# Patient Record
Sex: Male | Born: 1950 | Race: White | Hispanic: No | Marital: Married | Smoking: Former smoker
Health system: Southern US, Community
[De-identification: ages and names within clinical notes are randomized; demographics above are authoritative.]

## PROBLEM LIST (undated history)

## (undated) DIAGNOSIS — J449 Chronic obstructive pulmonary disease, unspecified: Secondary | ICD-10-CM

## (undated) DIAGNOSIS — D869 Sarcoidosis, unspecified: Secondary | ICD-10-CM

## (undated) DIAGNOSIS — F329 Major depressive disorder, single episode, unspecified: Secondary | ICD-10-CM

## (undated) DIAGNOSIS — C7931 Secondary malignant neoplasm of brain: Secondary | ICD-10-CM

## (undated) DIAGNOSIS — F32A Depression, unspecified: Secondary | ICD-10-CM

## (undated) DIAGNOSIS — K219 Gastro-esophageal reflux disease without esophagitis: Secondary | ICD-10-CM

## (undated) DIAGNOSIS — E871 Hypo-osmolality and hyponatremia: Secondary | ICD-10-CM

## (undated) DIAGNOSIS — IMO0001 Reserved for inherently not codable concepts without codable children: Secondary | ICD-10-CM

## (undated) DIAGNOSIS — IMO0002 Reserved for concepts with insufficient information to code with codable children: Secondary | ICD-10-CM

## (undated) DIAGNOSIS — M009 Pyogenic arthritis, unspecified: Secondary | ICD-10-CM

## (undated) DIAGNOSIS — Z66 Do not resuscitate: Secondary | ICD-10-CM

## (undated) DIAGNOSIS — I10 Essential (primary) hypertension: Secondary | ICD-10-CM

## (undated) DIAGNOSIS — C3490 Malignant neoplasm of unspecified part of unspecified bronchus or lung: Secondary | ICD-10-CM

## (undated) HISTORY — DX: Do not resuscitate: Z66

## (undated) HISTORY — DX: Reserved for concepts with insufficient information to code with codable children: IMO0002

## (undated) HISTORY — DX: Reserved for inherently not codable concepts without codable children: IMO0001

## (undated) HISTORY — PX: TONSILLECTOMY: SUR1361

---

## 1999-03-04 ENCOUNTER — Encounter: Payer: Self-pay | Admitting: Family Medicine

## 1999-03-04 ENCOUNTER — Ambulatory Visit (HOSPITAL_COMMUNITY): Admission: RE | Admit: 1999-03-04 | Discharge: 1999-03-04 | Payer: Self-pay | Admitting: Family Medicine

## 2004-11-21 ENCOUNTER — Encounter (INDEPENDENT_AMBULATORY_CARE_PROVIDER_SITE_OTHER): Payer: Self-pay | Admitting: *Deleted

## 2004-11-21 ENCOUNTER — Ambulatory Visit (HOSPITAL_COMMUNITY): Admission: RE | Admit: 2004-11-21 | Discharge: 2004-11-21 | Payer: Self-pay | Admitting: *Deleted

## 2004-12-27 ENCOUNTER — Ambulatory Visit: Admission: RE | Admit: 2004-12-27 | Discharge: 2004-12-27 | Payer: Self-pay | Admitting: Unknown Physician Specialty

## 2005-04-13 ENCOUNTER — Ambulatory Visit: Admission: RE | Admit: 2005-04-13 | Discharge: 2005-04-13 | Payer: Self-pay | Admitting: Family Medicine

## 2006-05-30 HISTORY — PX: CHOLECYSTECTOMY: SHX55

## 2006-06-01 ENCOUNTER — Encounter (INDEPENDENT_AMBULATORY_CARE_PROVIDER_SITE_OTHER): Payer: Self-pay | Admitting: Specialist

## 2006-06-01 ENCOUNTER — Ambulatory Visit (HOSPITAL_COMMUNITY): Admission: RE | Admit: 2006-06-01 | Discharge: 2006-06-01 | Payer: Self-pay | Admitting: Surgery

## 2007-05-23 ENCOUNTER — Encounter
Admission: RE | Admit: 2007-05-23 | Discharge: 2007-05-23 | Payer: Self-pay | Admitting: Physical Medicine and Rehabilitation

## 2007-08-19 ENCOUNTER — Encounter: Admission: RE | Admit: 2007-08-19 | Discharge: 2007-08-19 | Payer: Self-pay | Admitting: Neurological Surgery

## 2008-01-29 HISTORY — PX: CARPAL TUNNEL RELEASE: SHX101

## 2008-01-29 HISTORY — PX: ULNAR NERVE REPAIR: SHX2594

## 2008-02-14 ENCOUNTER — Ambulatory Visit (HOSPITAL_COMMUNITY): Admission: RE | Admit: 2008-02-14 | Discharge: 2008-02-14 | Payer: Self-pay | Admitting: Neurological Surgery

## 2008-07-13 ENCOUNTER — Encounter: Admission: RE | Admit: 2008-07-13 | Discharge: 2008-07-13 | Payer: Self-pay | Admitting: Gastroenterology

## 2008-12-28 ENCOUNTER — Ambulatory Visit (HOSPITAL_BASED_OUTPATIENT_CLINIC_OR_DEPARTMENT_OTHER): Admission: RE | Admit: 2008-12-28 | Discharge: 2008-12-28 | Payer: Self-pay | Admitting: Specialist

## 2008-12-28 ENCOUNTER — Encounter (INDEPENDENT_AMBULATORY_CARE_PROVIDER_SITE_OTHER): Payer: Self-pay | Admitting: Specialist

## 2008-12-28 HISTORY — PX: EYE SURGERY: SHX253

## 2009-10-30 DIAGNOSIS — M009 Pyogenic arthritis, unspecified: Secondary | ICD-10-CM

## 2009-10-30 HISTORY — DX: Pyogenic arthritis, unspecified: M00.9

## 2011-02-09 LAB — POCT HEMOGLOBIN-HEMACUE: Hemoglobin: 15.1 g/dL (ref 13.0–17.0)

## 2011-02-14 LAB — BASIC METABOLIC PANEL
CO2: 26 mEq/L (ref 19–32)
Calcium: 9.1 mg/dL (ref 8.4–10.5)
Creatinine, Ser: 0.73 mg/dL (ref 0.4–1.5)
GFR calc Af Amer: >60 mL/min (ref >60)
GFR calc non Af Amer: >60 mL/min (ref >60)
Sodium: 132 mEq/L — ABNORMAL LOW (ref 135–145)

## 2011-03-14 NOTE — Op Note (Signed)
NAME:  Spencer Long, Spencer Long              ACCOUNT NO.:  [id number]   MEDICAL RECORD NO.:  [id number]          PATIENT TYPE:  AMB   LOCATION:  DSC                          FACILITY:  [facility]   PHYSICIAN:  Earvin Hansen L. Truesdale, M.D.DATE OF BIRTH:  1951/10/23   DATE OF PROCEDURE:  12/28/2008  DATE OF DISCHARGE:                               OPERATIVE REPORT   A 60 year old gentleman has severe ectropion above his left lower lid  area times last year during the half.  The patient denies any trauma.  He denies any history of any neurological problems.  He was worked up  neurologically preoperatively.  No history of myasthenia gravis, stroke,  or neurological problems.  No other endocrinopathies by history.   PROCEDURES PERFORMED:  Correction of ectropion, release of band, wedge  excision and shortening as well as full-thickness skin graft  reconstruction.   ANESTHESIA:  General.   The patient underwent general anesthesia and intubated orally.  Preoperatively, the patient was set up and drawn for the infraciliary  margin as well as excess skin.  He had a laxity of 4 mm from the 6  o'clock position of the iris to the lid, especially the midportion.  Prep was done to the face with Betadine soap and solution and walled off  with sterile towels and drapes so as to make a sterile field.  The eyes  were protected with Lacri-Lube and a shield on the left side.  We  injected the area with 1% Xylocaine with epinephrine to the skin  locally.  We did a subciliary excision and flap with releasing of  tremendous bands in the midportion of the muscle as well as just under  the ciliary margin.  We dissected all way the down to the infraorbital  rim area and laterally to free up any bands of any pull on the  ectropion.  After doing this, the skin and the laxity of the ciliary  margins were tremendous outlining the excision of excess tissue and was  able to mount that back together lightly with multiple sutures  of 5-0  silk.  We allowed the tissue to settle down to allow everything to  retract and it showed a deficit of approximately 5 mm in the midportion  of skin.  We did a skin excision of upper lid with a 15 blade full-  thickness, that defect was reclosed back with running subcuticular  stitch of 5-0 nylon.  We also took some of the muscular tissue and  anchored it to the periosteum using a 3-0 deeply buried clear Prolene  suture.  Even with this, laxity continued and the full-thickness skin  graft was then placed into the defect and secured with multiple sutures  of 6-0 silks.  This fit very well and the repair was also protected with  a tarsorrhaphy using a 2-0 silk suture.  The wounds were cleansed.  Irrigation was done prior to this and the shield removed.  Next,  Xeroform eye patches were placed and a soft dressing.  I was very happy  with the results at the end.  He withstood the  procedures very well and  was taken to recovery in good condition.  We will allow his dressings to  stay at least 5 days before changing them to protect the graft there and  leave the tarsorrhaphy for about 5 days.       Spencer Long. Shon Hough, M.D.  Electronically Signed     GLT/MEDQ  D:  12/28/2008  T:  12/29/2008  Job:  [id number]

## 2011-03-14 NOTE — Op Note (Signed)
NAMELOYALTY, BRASHIER              ACCOUNT NO.:  [id number]   MEDICAL RECORD NO.:  [id number]          PATIENT TYPE:  AMB   LOCATION:  SDS                          FACILITY:  [facility]   PHYSICIAN:  Tia Alert, MD     DATE OF BIRTH:  July 23, 1951   DATE OF PROCEDURE:  02/14/2008  DATE OF DISCHARGE:                               OPERATIVE REPORT   PREOPERATIVE DIAGNOSES:  1. Right carpal tunnel syndrome.  2. Right ulnar neuropathy.   POSTOPERATIVE DIAGNOSES:  1. Right carpal tunnel syndrome.  2. Right ulnar neuropathy.   PROCEDURES:  1. Right ulnar nerve decompression.  2. Right carpal tunnel release.   SURGEON:  Tia Alert, MD.   ANESTHESIA:  General endotracheal.   COMPLICATIONS:  None apparent.   INDICATIONS FOR PROCEDURE:  Mr. Wherley is a 60 year old gentleman who  presented with numbness and weakness in his right hand.  He had nerve  conduction studies, which showed a significant ulnar neuropathy on the  right side, but also a severe right median neuropathy.  I recommended a  right ulnar nerve decompression as well as a right carpal tunnel  release.  She understood the risks, benefits, and expected outcome and  wished to proceed.   DESCRIPTION OF PROCEDURE:  The patient was taken to the operating room  and after induction of adequate generalized endotracheal anesthesia, his  right arm was extended on arm board and prepped circumferentially with  DuraPrep and then draped in the usual sterile fashion.  It was prepped  from the axilla to the fingertips.  We started the carpal tunnel, made a  small palmar incision from the distal wrist crease into the palm in  alignment with the middle finger, dissected down through the palmar  fascia, placed a self-retaining retractor, and the palmar fat was  coagulated with bipolar cautery.  The transverse carpal ligament was  identified and then opened with a 15 blade scalpel.  I then spread  between the ligament in the nerve with a  mosquito and transected the  transverse carpal ligament distally into the palm until the palmar fat  was identified, and the transverse carpal ligament was completely  transected distally into the palm.  I then palpated with the mosquito to  assure that the transverse carpal ligament was completely transected,  the nerve looked healthy and free.  I then performed the same procedure,  dissecting proximally into the arm under the wrist crease, dissecting  between the nerve and the transverse carpal ligament, and transecting  the ligament proximally into the arm until it was completely transected.  I then palpated once again with the mosquito to assure complete  transection of the transverse carpal ligament.  I then inspected the  nerve, the nerve was healthy and free, I irrigated with saline solution-  containing bacitracin, dried all bleeding points, and then closed the  fascia with a single 3-0 Vicryl suture, and then closed the subcutaneous  tissues with 3-0 Vicryl sutures, and the skin with 4-0 Ethilon  interrupted vertical mattress sutures.  I then turned my attention to  the right  ulnar neuropathy.  I made a curvilinear incision over the  right medial epicondyle of the ulna, dissected down through the soft  tissues into the cubital tunnel where I was able to spread gently,  identify the underlying ulnar nerve, and then dissect between the nerve  and the soft tissues distally into the arm, till I got to the flexor  musculature, opened the retinaculum, and completely decompressed the  nerve distally into the arm.  I then did spread between the nerve and  the soft tissues proximally until I went between the heads of the  triceps.  I then palpated along the nerve, I did not circumferentially  decompress the nerve in order to preserve its blood supply, and I did  not transpose the nerve.  I then irrigated it with saline solution.  The  nerve looked to be free.  I flexed the elbow to make  sure it did not  sublux, it did not.  Therefore, I dried all bleeding points with bipolar  cautery, closed the subcutaneous tissues with 2-0 Vicryl, closed the  subcuticular tissues with 3-0 Vicryl, and closed the skin with benzoin  and Steri-Strips.  The hand and the elbow were then wrapped in a Kerlix  and a Ace bandage.  The patient was then awakened from general  anesthesia and transferred to the recovery room in stable condition.  At  the end of procedure, all sponge, needle, and instrument counts were  correct.      Tia Alert, MD  Electronically Signed     DSJ/MEDQ  D:  02/14/2008  T:  02/14/2008  Job:  [id number]

## 2011-03-17 NOTE — Op Note (Signed)
NAME:  Spencer Long, Spencer Long              ACCOUNT NO.:  [id number]   MEDICAL RECORD NO.:  [id number]          PATIENT TYPE:  AMB   LOCATION:  DAY                          FACILITY:  [facility]   PHYSICIAN:  Wilmon Arms. Corliss Skains, M.D. DATE OF BIRTH:  13-Nov-1950   DATE OF PROCEDURE:  06/01/2006  DATE OF DISCHARGE:                                 OPERATIVE REPORT   PREOPERATIVE DIAGNOSIS:  Chronic calculus cholecystitis.   POSTOPERATIVE DIAGNOSIS:  Chronic calculus cholecystitis.   PROCEDURE PERFORMED:  Laparoscopic cholecystectomy with intraoperative  cholangiogram.   SURGEON:  Dr. Corliss Skains   ASSISTANT:  Dr. Kendrick Ranch   ANESTHESIA:  General endotracheal.   INDICATIONS:  The patient is a 60 year old male, who presents with several  months of posterior shoulder pain, abdominal bloating and diarrhea.  His  physician has thoroughly evaluated him with stool studies which are  negative.  CT scan showed a nonobstructing right kidney stone and multiple  gallstones.  Liver function tests were normal.  His colonoscopy last year  was negative.  Due to his symptoms and the presence of gallstones, we  recommended a laparoscopic cholecystectomy.  The patient understands that  his symptoms are atypical and that there is no guarantee that  cholecystectomy will resolve his symptoms.   DESCRIPTION OF PROCEDURE:  The patient was brought to the operating room,  placed supine position on the operating room table.  After an adequate level  of general anesthesia was obtained, the patient's abdomen was prepped with  Betadine and draped in sterile fashion.  Time-out was taken assure the  proper patient, proper procedure.  The area around his umbilicus was  infiltrated with 0.25% Marcaine, and a transverse incision was made here.  Dissection was carried down to the fascia which was opened vertically.  The  peritoneum was entered, and a stay suture of 0 Vicryl was placed in a  pursestring fashion around the fascial  opening.  The Hasson cannula was  inserted and secured with the stay suture.  The laparoscope was inserted,  and a quick visual examination of abdomen showed a normal-appearing liver  and stomach.  The omentum appeared densely adherent to the gallbladder.  A  10 mm port was placed in the subxiphoid position, and two 5 mm ports were  placed on the right side.  Cautery scissors were used to dissect the omentum  away from the gallbladder.  Once the gallbladder was exposed, it was grasped  with a clamp and elevated over the edge of the liver.  The peritoneum at the  hilum of the gallbladder was opened.  The cystic duct was identified and was  circumferentially dissected.  It was ligated with a clip distally.  A small  opening was created on the cystic duct, and a Cook cholangiogram catheter  was inserted.  It was secured with a clip, and a cholangiogram was then  obtained.  The cholangiogram showed good flow of contrast proximally and  distally through both sides of the biliary tree.  There is good flow into  the duodenum.  No evidence of filling defects.  The catheter was  removed,  and the cystic duct was ligated with clips and divided.  The cystic artery  was also ligated with clips and divided.  Cautery was then used to dissect  the gallbladder free from the liver.  The gallbladder was placed in an  EndoCatch sac.  The gallbladder fossa was then reinspected, and hemostasis  was obtained with cautery.  The gallbladder was removed through the  umbilical port site.  The right upper quadrant was reinspected, and no  bleeding  was noted.  The pursestring suture was used to close the umbilical fascia.  Monocryl 4-0 was used to close the skin in subcuticular fashion.  Steri-  Strips and clean dressings were applied.  The patient was extubated and  brought to recovery in stable condition.  All sponge, instrument, and needle  counts were correct.      Wilmon Arms. Tsuei, M.D.  Electronically  Signed     MKT/MEDQ  D:  06/01/2006  T:  06/01/2006  Job:  [id number]   cc:   Royetta Crochet, MD  Fax: [phone]

## 2011-07-25 LAB — BASIC METABOLIC PANEL
Calcium: 9.5
GFR calc Af Amer: >60
GFR calc non Af Amer: >60
Potassium: 4.5
Sodium: 131 — ABNORMAL LOW

## 2011-07-25 LAB — PROTIME-INR: Prothrombin Time: 12.1

## 2011-07-25 LAB — APTT: aPTT: 29

## 2011-07-25 LAB — CBC
HCT: 43.2
Hemoglobin: 15.2
WBC: 7.5

## 2011-07-25 LAB — DIFFERENTIAL
Basophils Absolute: 0
Eosinophils Relative: 3
Lymphocytes Relative: 27
Lymphs Abs: 2.1
Monocytes Absolute: 0.9
Neutro Abs: 4.4

## 2011-09-12 ENCOUNTER — Encounter (HOSPITAL_BASED_OUTPATIENT_CLINIC_OR_DEPARTMENT_OTHER): Payer: Self-pay | Admitting: Anesthesiology

## 2011-09-12 ENCOUNTER — Encounter (HOSPITAL_BASED_OUTPATIENT_CLINIC_OR_DEPARTMENT_OTHER)
Admission: RE | Disposition: A | Discharge status: Home / Self Care | Payer: Self-pay | Source: Ambulatory Visit | Attending: Orthopedic Surgery

## 2011-09-12 ENCOUNTER — Other Ambulatory Visit: Payer: Self-pay

## 2011-09-12 ENCOUNTER — Ambulatory Visit (HOSPITAL_BASED_OUTPATIENT_CLINIC_OR_DEPARTMENT_OTHER)
Admission: RE | Admit: 2011-09-12 | Discharge: 2011-09-12 | Disposition: A | Discharge status: Home / Self Care | Payer: Managed Care, Other (non HMO) | Source: Ambulatory Visit | Attending: Orthopedic Surgery | Admitting: Orthopedic Surgery

## 2011-09-12 ENCOUNTER — Encounter (HOSPITAL_BASED_OUTPATIENT_CLINIC_OR_DEPARTMENT_OTHER): Payer: Self-pay | Admitting: Certified Registered"

## 2011-09-12 ENCOUNTER — Encounter (HOSPITAL_BASED_OUTPATIENT_CLINIC_OR_DEPARTMENT_OTHER): Payer: Self-pay | Admitting: *Deleted

## 2011-09-12 ENCOUNTER — Ambulatory Visit (HOSPITAL_BASED_OUTPATIENT_CLINIC_OR_DEPARTMENT_OTHER): Payer: Managed Care, Other (non HMO) | Admitting: Anesthesiology

## 2011-09-12 DIAGNOSIS — M7020 Olecranon bursitis, unspecified elbow: Secondary | ICD-10-CM | POA: Insufficient documentation

## 2011-09-12 DIAGNOSIS — F17200 Nicotine dependence, unspecified, uncomplicated: Secondary | ICD-10-CM | POA: Insufficient documentation

## 2011-09-12 DIAGNOSIS — I10 Essential (primary) hypertension: Secondary | ICD-10-CM | POA: Insufficient documentation

## 2011-09-12 DIAGNOSIS — K219 Gastro-esophageal reflux disease without esophagitis: Secondary | ICD-10-CM | POA: Insufficient documentation

## 2011-09-12 HISTORY — PX: I&D EXTREMITY: SHX5045

## 2011-09-12 HISTORY — DX: Gastro-esophageal reflux disease without esophagitis: K21.9

## 2011-09-12 HISTORY — DX: Pyogenic arthritis, unspecified: M00.9

## 2011-09-12 HISTORY — DX: Essential (primary) hypertension: I10

## 2011-09-12 LAB — POCT I-STAT, CHEM 8
BUN: 19 mg/dL (ref 6–23)
Creatinine, Ser: 1 mg/dL (ref 0.50–1.35)
Hemoglobin: 13.9 g/dL (ref 13.0–17.0)
Potassium: 5.5 mEq/L — ABNORMAL HIGH (ref 3.5–5.1)
Sodium: 125 mEq/L — ABNORMAL LOW (ref 135–145)
TCO2: 25 mmol/L (ref 0–100)

## 2011-09-12 SURGERY — IRRIGATION AND DEBRIDEMENT EXTREMITY
Anesthesia: General | Site: Elbow | Laterality: Right | Wound class: Dirty or Infected

## 2011-09-12 MED ORDER — HYDROMORPHONE HCL PF 1 MG/ML IJ SOLN: 0.2500 mg | INTRAMUSCULAR | Status: DC | PRN

## 2011-09-12 MED ORDER — FENTANYL CITRATE 0.05 MG/ML IJ SOLN
INTRAMUSCULAR | Status: DC | PRN
  Administered 2011-09-12 (×2): 50 ug via INTRAVENOUS

## 2011-09-12 MED ORDER — PROPOFOL 10 MG/ML IV EMUL
INTRAVENOUS | Status: DC | PRN
  Administered 2011-09-12: 170 mg via INTRAVENOUS

## 2011-09-12 MED ORDER — LACTATED RINGERS IV SOLN
INTRAVENOUS | Status: DC
  Administered 2011-09-12 (×2): via INTRAVENOUS

## 2011-09-12 MED ORDER — OXYCODONE-ACETAMINOPHEN 5-325 MG PO TABS: ORAL_TABLET | ORAL | Status: DC

## 2011-09-12 MED ORDER — CEFAZOLIN SODIUM 1-5 GM-% IV SOLN
1.0000 g | Freq: Once | INTRAVENOUS | Status: AC
  Administered 2011-09-12: 1 g via INTRAVENOUS

## 2011-09-12 MED ORDER — EPHEDRINE SULFATE 50 MG/ML IJ SOLN
INTRAMUSCULAR | Status: DC | PRN
  Administered 2011-09-12: 10 mg via INTRAVENOUS

## 2011-09-12 MED ORDER — BUPIVACAINE HCL (PF) 0.25 % IJ SOLN
INTRAMUSCULAR | Status: DC | PRN
  Administered 2011-09-12: 10 mL

## 2011-09-12 MED ORDER — ONDANSETRON HCL 4 MG/2ML IJ SOLN
INTRAMUSCULAR | Status: DC | PRN
  Administered 2011-09-12: 4 mg via INTRAVENOUS

## 2011-09-12 MED ORDER — SULFAMETHOXAZOLE-TRIMETHOPRIM 800-160 MG PO TABS: 1.0000 | ORAL_TABLET | Freq: Two times a day (BID) | ORAL | Status: AC

## 2011-09-12 MED ORDER — LIDOCAINE HCL (CARDIAC) 20 MG/ML IV SOLN
INTRAVENOUS | Status: DC | PRN
  Administered 2011-09-12: 25 mg via INTRAVENOUS

## 2011-09-12 MED ORDER — PROMETHAZINE HCL 25 MG/ML IJ SOLN: 6.2500 mg | INTRAMUSCULAR | Status: DC | PRN

## 2011-09-12 MED ORDER — DEXAMETHASONE SODIUM PHOSPHATE 4 MG/ML IJ SOLN
INTRAMUSCULAR | Status: DC | PRN
  Administered 2011-09-12: 8 mg via INTRAVENOUS

## 2011-09-12 MED ORDER — TRAMADOL HCL 50 MG PO TABS: ORAL_TABLET | ORAL | Status: DC

## 2011-09-12 MED ORDER — KETOROLAC TROMETHAMINE 30 MG/ML IJ SOLN: 15.0000 mg | Freq: Once | INTRAMUSCULAR | Status: DC | PRN

## 2011-09-12 MED ORDER — MIDAZOLAM HCL 5 MG/5ML IJ SOLN
INTRAMUSCULAR | Status: DC | PRN
  Administered 2011-09-12: 1 mg via INTRAVENOUS

## 2011-09-12 MED ORDER — MEPERIDINE HCL 25 MG/ML IJ SOLN: 6.2500 mg | INTRAMUSCULAR | Status: DC | PRN

## 2011-09-12 SURGICAL SUPPLY — 53 items
BAG DECANTER FOR FLEXI CONT (MISCELLANEOUS) IMPLANT
BANDAGE ELASTIC 3 VELCRO ST LF (GAUZE/BANDAGES/DRESSINGS) IMPLANT
BANDAGE GAUZE ELAST BULKY 4 IN (GAUZE/BANDAGES/DRESSINGS) ×2 IMPLANT
BANDAGE GAUZE STRT 1 STR LF (GAUZE/BANDAGES/DRESSINGS) IMPLANT
BLADE MINI RND TIP GREEN BEAV (BLADE) IMPLANT
BLADE SURG 15 STRL LF DISP TIS (BLADE) ×2 IMPLANT
BLADE SURG 15 STRL SS (BLADE) ×2
BNDG COHESIVE 1X5 TAN STRL LF (GAUZE/BANDAGES/DRESSINGS) IMPLANT
BNDG ELASTIC 2 VLCR STRL LF (GAUZE/BANDAGES/DRESSINGS) IMPLANT
BNDG ESMARK 4X9 LF (GAUZE/BANDAGES/DRESSINGS) ×2 IMPLANT
CHLORAPREP W/TINT 26ML (MISCELLANEOUS) ×2 IMPLANT
CLOTH BEACON ORANGE TIMEOUT ST (SAFETY) ×2 IMPLANT
CORDS BIPOLAR (ELECTRODE) IMPLANT
COVER MAYO STAND STRL (DRAPES) ×2 IMPLANT
COVER TABLE BACK 60X90 (DRAPES) ×2 IMPLANT
CUFF TOURNIQUET SINGLE 18IN (TOURNIQUET CUFF) ×2 IMPLANT
DRAPE EXTREMITY T 121X128X90 (DRAPE) ×2 IMPLANT
DRAPE SURG 17X23 STRL (DRAPES) ×2 IMPLANT
DRSG PAD ABDOMINAL 8X10 ST (GAUZE/BANDAGES/DRESSINGS) ×2 IMPLANT
GAUZE PACKING IODOFORM 1/4X5 (PACKING) ×2 IMPLANT
GAUZE XEROFORM 1X8 LF (GAUZE/BANDAGES/DRESSINGS) ×2 IMPLANT
GLOVE BIO SURGEON STRL SZ7.5 (GLOVE) ×2 IMPLANT
GLOVE BIOGEL PI IND STRL 8 (GLOVE) ×1 IMPLANT
GLOVE BIOGEL PI INDICATOR 8 (GLOVE) ×1
GLOVE SKINSENSE NS SZ7.0 (GLOVE) ×1
GLOVE SKINSENSE STRL SZ7.0 (GLOVE) ×1 IMPLANT
GLOVE SURG ORTHO 8.0 STRL STRW (GLOVE) ×2 IMPLANT
GOWN PREVENTION PLUS XLARGE (GOWN DISPOSABLE) ×2 IMPLANT
GOWN STRL REIN XL XLG (GOWN DISPOSABLE) ×4 IMPLANT
LOOP VESSEL MAXI BLUE (MISCELLANEOUS) IMPLANT
NEEDLE 27GAX1X1/2 (NEEDLE) ×2 IMPLANT
NS IRRIG 1000ML POUR BTL (IV SOLUTION) ×2 IMPLANT
PACK BASIN DAY SURGERY FS (CUSTOM PROCEDURE TRAY) ×2 IMPLANT
PAD CAST 3X4 CTTN HI CHSV (CAST SUPPLIES) ×1 IMPLANT
PADDING CAST ABS 4INX4YD NS (CAST SUPPLIES) ×1
PADDING CAST ABS COTTON 4X4 ST (CAST SUPPLIES) ×1 IMPLANT
PADDING CAST COTTON 3X4 STRL (CAST SUPPLIES) ×1
PADDING WEBRIL 4 STERILE (GAUZE/BANDAGES/DRESSINGS) ×2 IMPLANT
PLASTER SPLINT XTRA FAST 3X15 (CAST SUPPLIES) ×10 IMPLANT
SPLINT PLASTER CAST XFAST 3X15 (CAST SUPPLIES) IMPLANT
SPLINT PLASTER XTRA FASTSET 3X (CAST SUPPLIES)
SPONGE GAUZE 4X4 12PLY (GAUZE/BANDAGES/DRESSINGS) ×2 IMPLANT
SPONGE GAUZE 4X4 FOR O.R. (GAUZE/BANDAGES/DRESSINGS) ×2 IMPLANT
STOCKINETTE 4X48 STRL (DRAPES) ×2 IMPLANT
SUT ETHILON 4 0 PS 2 18 (SUTURE) IMPLANT
SWAB CULTURE LIQ STUART DBL (MISCELLANEOUS) IMPLANT
SYR BULB 3OZ (MISCELLANEOUS) ×2 IMPLANT
SYR CONTROL 10ML LL (SYRINGE) ×2 IMPLANT
TOWEL OR 17X24 6PK STRL BLUE (TOWEL DISPOSABLE) ×4 IMPLANT
TUBE ANAEROBIC SPECIMEN COL (MISCELLANEOUS) ×2 IMPLANT
TUBE FEEDING 5FR 15 INCH (TUBING) IMPLANT
UNDERPAD 30X30 INCONTINENT (UNDERPADS AND DIAPERS) ×2 IMPLANT
WATER STERILE IRR 1000ML POUR (IV SOLUTION) IMPLANT

## 2011-09-12 NOTE — Anesthesia Postprocedure Evaluation (Signed)
  Anesthesia Post-op Note  Patient: Spencer Long  Procedure(s) Performed:  IRRIGATION AND DEBRIDEMENT EXTREMITY - I&d RIGHT OLECRANON BURSSA   Patient Location: PACU  Anesthesia Type: General  Level of Consciousness: awake  Airway and Oxygen Therapy: Patient Spontanous Breathing  Post-op Pain: none  Post-op Assessment: Post-op Vital signs reviewed  Post-op Vital Signs: stable  Complications: No apparent anesthesia complications

## 2011-09-12 NOTE — Brief Op Note (Signed)
09/12/2011  1:55 PM  PATIENT:  Spencer Long  60 y.o. male  PRE-OPERATIVE DIAGNOSIS:  INFECTION RIGHT OLECRANON  POST-OPERATIVE DIAGNOSIS:  * No post-op diagnosis entered *  PROCEDURE:  Procedure(s): IRRIGATION AND DEBRIDEMENT EXTREMITY  SURGEON:  Surgeon(s): Chuck Hint, MD  PHYSICIAN ASSISTANT:   ASSISTANTS: none   ANESTHESIA:   general  EBL:  Total I/O In: 1000 [I.V.:1000] Out: -   BLOOD ADMINISTERED:none  DRAINS: none   LOCAL MEDICATIONS USED:  MARCAINE 10CC  SPECIMEN:  Source of Specimen:  right olecranon bursa  DISPOSITION OF SPECIMEN:  micro  COUNTS:  YES  TOURNIQUET:   Total Tourniquet Time Documented: Upper Arm (Right) - 16 minutes  DICTATION: .Other Dictation: Dictation Number [id number]  PLAN OF CARE: Discharge to home after PACU  PATIENT DISPOSITION:  PACU - hemodynamically stable.   Delay start of Pharmacological VTE agent (>24hrs) due to surgical blood loss or risk of bleeding:  no

## 2011-09-12 NOTE — Anesthesia Procedure Notes (Signed)
Procedure Name: LMA Insertion Date/Time: 09/12/2011 1:27 PM Performed by: Radford Pax Pre-anesthesia Checklist: Patient identified, Timeout performed, Emergency Drugs available, Suction available and Patient being monitored Patient Re-evaluated:Patient Re-evaluated prior to inductionOxygen Delivery Method: Circle System Utilized Preoxygenation: Pre-oxygenation with 100% oxygen Intubation Type: IV induction Ventilation: Mask ventilation without difficulty LMA: LMA inserted LMA Size: 4.0 Number of attempts: 1 Placement Confirmation: positive ETCO2 Tube secured with: Tape Dental Injury: Teeth and Oropharynx as per pre-operative assessment

## 2011-09-12 NOTE — Transfer of Care (Signed)
Immediate Anesthesia Transfer of Care Note  Patient: Spencer Long  Procedure(s) Performed:  IRRIGATION AND DEBRIDEMENT EXTREMITY - I&d RIGHT OLECRANON BURSSA   Patient Location: PACU  Anesthesia Type: General  Level of Consciousness: awake, alert , oriented and patient cooperative  Airway & Oxygen Therapy: Patient Spontanous Breathing  Post-op Assessment: Report given to PACU RN and Post -op Vital signs reviewed and stable  Post vital signs: Reviewed and stable  Complications: No apparent anesthesia complications

## 2011-09-12 NOTE — Op Note (Signed)
Dictation [id number]

## 2011-09-12 NOTE — H&P (Signed)
  Spencer Long is an 60 y.o. male.   Chief Complaint: infected right olecranon bursa HPI: 61 year old rhd male states he bumped right elbow ~ 1 week ago.  No wounds or punctures noted.  Started to swell.  Seen at [facility] 11/9.  Needle aspiration.  Continued drainage.  Swelling decreased.  Some AM sweats.  No fevers, chills.  On abx, but cant remember name.  States clear yellow drainage expressed at aspiration.  Past Medical History  Diagnosis Date  . Hypertension     under control, has been on med. x 15-20 yrs.  [name] GERD (gastroesophageal reflux disease)   . Infection of elbow     right    Past Surgical History  Procedure Date  . Cholecystectomy 05/2006  . Ulnar nerve repair 01/2008    right; decompression  . Carpal tunnel release 01/2008    right  . Eye surgery 12/2008    correction ectropion, release band, wedge exc., shortening    History reviewed. No pertinent family history. Social History:  reports that he has been smoking Cigarettes.  He has a 60 pack-year smoking history. He has never used smokeless tobacco. He reports that he drinks alcohol. He reports that he does not use illicit drugs.  Allergies:  Allergies  Allergen Reactions  . Isothiazolinone Chloride Rash  . Quaternium-15 Rash    No current facility-administered medications on file as of .   No current outpatient prescriptions on file as of .    No results found for this or any previous visit (from the past 48 hour(s)).  No results found.   A comprehensive review of systems was negative except for: Gastrointestinal: positive for nausea and vomiting  Height 5\' 11"  (1.803 m), weight 74.39 kg (164 lb).  General appearance: alert, cooperative and appears stated age Head: Normocephalic, without obvious abnormality, atraumatic Neck: supple, symmetrical, trachea midline Resp: clear to auscultation bilaterally Cardio: regular rate and rhythm, S1, S2 normal, no murmur, click, rub or gallop GI: soft,  non-tender; bowel sounds normal; no masses,  no organomegaly Extremities: Bilateral upper extremities intact to light touch sensation and capillary refill in all fingertips.  +epl/fpl/io.  Left upper extremity without wound or ttp.  right upper extremity with small wound posterior elbow with surrounding erythema and swelling.  Fluctuance noted.  No proximal streaking.  Thin fluid drainage. Pulses: 2+ and symmetric Skin: Skin color, texture, turgor normal. No rashes or lesions or posterior right elbow wound with erythema. Neurologic: Grossly normal Incision/Wound: As above.  Assessment/Plan Right olecranon bursitis.  Recommend irrigation and debridement in OR.  Risks, benefits, alternatives discussed and patient agrees with plan of care.  Alissa Pharr R 09/12/2011, 11:16 AM

## 2011-09-12 NOTE — Anesthesia Preprocedure Evaluation (Addendum)
Anesthesia Evaluation  Patient identified by MRN, date of birth, ID band Patient awake    Reviewed: Allergy & Precautions, H&P , NPO status , Patient's Chart, lab work & pertinent test results  History of Anesthesia Complications Negative for: history of anesthetic complications  Airway Mallampati: I  Neck ROM: Full    Dental  (+) Teeth Intact,    Pulmonary neg pulmonary ROS, Current Smoker,  clear to auscultation        Cardiovascular hypertension, Pt. on medications Regular Normal    Neuro/Psych Negative Neurological ROS     GI/Hepatic GERD-  Medicated,  Endo/Other    Renal/GU      Musculoskeletal   Abdominal   Peds  Hematology   Anesthesia Other Findings   Reproductive/Obstetrics                          Anesthesia Physical Anesthesia Plan  ASA: II  Anesthesia Plan: General   Post-op Pain Management:    Induction: Intravenous  Airway Management Planned: LMA  Additional Equipment:   Intra-op Plan:   Post-operative Plan: Extubation in OR  Informed Consent: I have reviewed the patients History and Physical, chart, labs and discussed the procedure including the risks, benefits and alternatives for the proposed anesthesia with the patient or authorized representative who has indicated his/her understanding and acceptance.   Dental advisory given  Plan Discussed with: CRNA and Surgeon  Anesthesia Plan Comments:         Anesthesia Quick Evaluation

## 2011-09-13 NOTE — Op Note (Signed)
NAME:  Spencer Long, Spencer Long NO.:  MEDICAL RECORD NO.:  [id number]  LOCATION:                                 FACILITY:  PHYSICIAN:  Betha Loa, MD             DATE OF BIRTH:  DATE OF PROCEDURE:  09/12/2011 DATE OF DISCHARGE:                              OPERATIVE REPORT   PREOPERATIVE DIAGNOSIS:  Right infected olecranon bursa.  POSTOPERATIVE DIAGNOSIS:  Right infected olecranon bursa.  PROCEDURE:  Irrigation and debridement of right olecranon bursa.  SURGEON:  Betha Loa, MD  ASSISTANT:  Spencer Salt, MD  ANESTHESIA:  General.  IV FLUIDS:  Per anesthesia flow sheet.  ESTIMATED BLOOD LOSS:  Minimal.  COMPLICATIONS:  None.  SPECIMENS:  Cultures to micro and tissue to micro.  TOURNIQUET TIME:  16 minutes.  DISPOSITION:  Stable to PACU.  INDICATIONS:  Spencer Long is a 60 year old male who states approximately 1 week ago, he bumped his right elbow.  He had swelling and redness in the elbow.  He did not remember any wounds.  He was seen at [facility] on Friday where aspiration of the right olecranon bursa was performed. He states clear yellow fluid came out.  He returned there today for followup and there was concern of continued infection.  He was referred to me for further care.  On examination, he had a wound on the dorsal aspect of the elbow that is draining purulence.  There was erythema surrounding this.  He did not have pain with motion of the elbow.  He had tenderness to palpation right around the wound.  There was no proximal streaking.  I discussed with Spencer Long the nature of the condition.  I recommended going to the operating room for irrigation and debridement of the right olecranon bursa.  Risks, benefits, and alternatives of surgery were discussed including risk of blood loss, infection, damage to nerves, vessels, tendons, ligaments, bone, failure to procedure, need for additional procedures, complications with wound healing,  continued pain, continued infection, need for repeat irrigation and debridement.  He voiced understanding of this risk and elected to proceed.  OPERATIVE COURSE:  After being identified preoperatively by myself, the patient and I agreed upon procedure and site procedure.  The surgical site was marked.  The risks, benefits, and alternatives of surgery were reviewed and wished to proceed.  Surgical consent had been signed.  He had been on oral antibiotics but did not remember exactly what they were.  He was transported to the operating room and placed on the operating table in supine position with the right upper extremity on arm board.  General anesthesia was induced by the anesthesiologist.  The right upper extremity was prepped and draped in normal sterile orthopedic fashion.  Surgical pause was performed between surgeons, anesthesia, operating staff, and all were in agreement with the patient procedure and site procedure.  Tourniquet at proximal aspect of the extremity was inflated to 250 mmHg after gravity exsanguination of the limb.  An incision was made at the dorsal aspect of the elbow including the wound.  There was gross purulence.  Cultures were taken  by both swab and tissue.  The bursa was opened and cleaned using a curette and rongeur.  There was some loose tissue within it.  The wound was then copiously irrigated with 1000 mL of sterile saline.  It was packed with quarter-inch iodoform gauze.  The wound was injected with 10 mL of 0.25% plain Marcaine to aid in postoperative analgesia.  The wound was then dressed with sterile Xeroform and 4x4s and ABD and wrapped with Kerlix.  A posterior splint with the elbow at 90 degrees was placed and wrapped with Kerlix and a Coban dressing lightly.  The tourniquet was deflated at 16 minutes.  Fingertips were pink with brisk capillary refill after deflation of tourniquet.  Operative drapes were broken down. The patient was awoken from  anesthesia safely.  He was transferred back to the stretcher and taken to PACU in stable condition.  I will see him back in the office in 3 days for postoperative followup and initiation of hydrotherapy.  I will give him Percocet 5/325, 1-2 p.o. q.6 hours p.r.n. pain, dispensed #40 and Bactrim DS 1 p.o. b.i.d. x7 days.     Betha Loa, MD     KK/MEDQ  D:  09/12/2011  T:  09/12/2011  Job:  [id number]

## 2011-09-15 LAB — TISSUE CULTURE

## 2011-09-15 LAB — WOUND CULTURE

## 2011-09-18 ENCOUNTER — Encounter (HOSPITAL_BASED_OUTPATIENT_CLINIC_OR_DEPARTMENT_OTHER): Payer: Self-pay

## 2011-09-18 ENCOUNTER — Encounter (HOSPITAL_BASED_OUTPATIENT_CLINIC_OR_DEPARTMENT_OTHER): Payer: Self-pay | Admitting: Orthopedic Surgery

## 2011-09-25 ENCOUNTER — Other Ambulatory Visit: Payer: Self-pay | Admitting: Gastroenterology

## 2011-09-25 DIAGNOSIS — R1031 Right lower quadrant pain: Secondary | ICD-10-CM

## 2011-09-28 ENCOUNTER — Ambulatory Visit
Admission: RE | Admit: 2011-09-28 | Discharge: 2011-09-28 | Disposition: A | Discharge status: Home / Self Care | Payer: Managed Care, Other (non HMO) | Source: Ambulatory Visit | Attending: Gastroenterology | Admitting: Gastroenterology

## 2011-09-28 DIAGNOSIS — R1031 Right lower quadrant pain: Secondary | ICD-10-CM

## 2011-09-28 MED ORDER — IOHEXOL 300 MG/ML  SOLN
100.0000 mL | Freq: Once | INTRAMUSCULAR | Status: AC | PRN
  Administered 2011-09-28: 100 mL via INTRAVENOUS

## 2012-07-12 ENCOUNTER — Other Ambulatory Visit: Payer: Self-pay

## 2013-09-04 ENCOUNTER — Ambulatory Visit
Admission: RE | Admit: 2013-09-04 | Discharge: 2013-09-04 | Disposition: A | Discharge status: Home / Self Care | Payer: Managed Care, Other (non HMO) | Source: Ambulatory Visit | Attending: Internal Medicine | Admitting: Internal Medicine

## 2013-09-04 ENCOUNTER — Other Ambulatory Visit: Payer: Self-pay | Admitting: Internal Medicine

## 2013-09-04 DIAGNOSIS — E236 Other disorders of pituitary gland: Secondary | ICD-10-CM

## 2014-04-24 ENCOUNTER — Other Ambulatory Visit: Payer: Self-pay

## 2014-07-24 ENCOUNTER — Other Ambulatory Visit: Payer: Self-pay | Admitting: Family Medicine

## 2014-07-24 DIAGNOSIS — R1033 Periumbilical pain: Secondary | ICD-10-CM

## 2014-07-30 ENCOUNTER — Ambulatory Visit
Admission: RE | Admit: 2014-07-30 | Discharge: 2014-07-30 | Disposition: A | Discharge status: Home / Self Care | Payer: Managed Care, Other (non HMO) | Source: Ambulatory Visit | Attending: Family Medicine | Admitting: Family Medicine

## 2014-07-30 DIAGNOSIS — R1033 Periumbilical pain: Secondary | ICD-10-CM

## 2014-07-30 MED ORDER — IOHEXOL 300 MG/ML  SOLN
100.0000 mL | Freq: Once | INTRAMUSCULAR | Status: AC | PRN
  Administered 2014-07-30: 100 mL via INTRAVENOUS

## 2014-08-06 ENCOUNTER — Other Ambulatory Visit: Payer: Self-pay | Admitting: Family Medicine

## 2014-08-11 ENCOUNTER — Encounter: Payer: Self-pay | Admitting: Emergency Medicine

## 2014-08-11 ENCOUNTER — Encounter (INDEPENDENT_AMBULATORY_CARE_PROVIDER_SITE_OTHER): Payer: Self-pay

## 2014-08-11 ENCOUNTER — Ambulatory Visit (INDEPENDENT_AMBULATORY_CARE_PROVIDER_SITE_OTHER): Payer: Managed Care, Other (non HMO) | Admitting: Emergency Medicine

## 2014-08-11 VITALS — BP 100/72 | HR 81 | Temp 97.2°F | Ht 69.0 in | Wt 147.2 lb

## 2014-08-11 DIAGNOSIS — Z720 Tobacco use: Secondary | ICD-10-CM

## 2014-08-11 DIAGNOSIS — R918 Other nonspecific abnormal finding of lung field: Secondary | ICD-10-CM

## 2014-08-11 DIAGNOSIS — F17200 Nicotine dependence, unspecified, uncomplicated: Secondary | ICD-10-CM

## 2014-08-11 DIAGNOSIS — J984 Other disorders of lung: Secondary | ICD-10-CM

## 2014-08-11 DIAGNOSIS — Z87891 Personal history of nicotine dependence: Secondary | ICD-10-CM | POA: Insufficient documentation

## 2014-08-11 NOTE — Assessment & Plan Note (Signed)
At risk for COPD. Will check spirometry here today prior to planned anesthesia

## 2014-08-11 NOTE — Progress Notes (Signed)
Subjective:    Patient ID: Spencer Long, male    DOB: 1951-04-08, 63 y.o.   MRN: [id number]  HPI 63 yo smoker (60 pk-yrs), hx of HTN, GERD, severe contact dermatitis. He also carries a dx of possible sarcoidosis following a nodal biopsy many years ago. He underwent a CT abd and then a CT chest 08/07/14 that showed a LLL bronchial obstruction vs mass. He describes some fatigue, difficulty climbing stairs over the last few months. Denies severe cough except in the am relating to PND. No hemoptysis. No CP. He is referred to eval the lung nodule.    Review of Systems  Constitutional: Negative.   HENT: Negative.   Eyes: Negative.   Respiratory: Positive for shortness of breath.   Cardiovascular: Negative.   Gastrointestinal: Negative.   Endocrine: Negative.   Genitourinary: Negative.   Allergic/Immunologic: Negative.   Neurological: Negative.   Hematological: Negative.   Psychiatric/Behavioral: Negative.     Past Medical History  Diagnosis Date  . Hypertension     under control, has been on med. x 15-20 yrs.  [name] GERD (gastroesophageal reflux disease)   . Infection of elbow     right     Family History  Problem Relation Age of Onset  . Cancer Father     Leukemia  . Cancer Mother     breast  . Dementia Mother      History   Social History  . Marital Status: Married    Spouse Name: N/A    Number of Children: 1  . Years of Education: N/A   Occupational History  .  [employer]   Social History Main Topics  . Smoking status: Current Every Day Smoker -- 1.50 packs/day for 40 years    Types: Cigarettes  . Smokeless tobacco: Never Used  . Alcohol Use: Yes     Comment: 2-4 drinks daily  . Drug Use: No  . Sexual Activity:    Other Topics Concern  . Not on file   Social History Narrative  . No narrative on file     Allergies  Allergen Reactions  . Varenicline   . Isothiazolinone Chloride Rash  . Quaternium-15 Rash     Outpatient Prescriptions Prior  to Visit  Medication Sig Dispense Refill  . atenolol (TENORMIN) 25 MG tablet Take 25 mg by mouth 2 (two) times daily.        [name] omeprazole (PRILOSEC) 20 MG capsule Take 40 mg by mouth daily. AM      . meloxicam (MOBIC) 15 MG tablet Take 15 mg by mouth daily. AM       . traMADol (ULTRAM) 50 MG tablet 1-2 tabs PO q6 hours prn pain Maximum dose= 8 tablets per day  40 tablet  0   No facility-administered medications prior to visit.         Objective:   Physical Exam Filed Vitals:   08/11/14 1509  BP: 100/72  Pulse: 81  Temp: 97.2 F (36.2 C)  TempSrc: Oral  Height: 5\' 9"  (1.753 m)  Weight: 147 lb 3.2 oz (66.769 kg)  SpO2: 94%   Gen: Pleasant, well-nourished, in no distress,  normal affect  ENT: No lesions,  mouth clear,  oropharynx clear, no postnasal drip  Neck: No JVD, no TMG, no carotid bruits  Lungs: No use of accessory muscles, no dullness to percussion, clear without rales or rhonchi  Cardiovascular: RRR, heart sounds normal, no murmur or gallops, no peripheral edema  Musculoskeletal: No  deformities, no cyanosis or clubbing  Neuro: alert, non focal  Skin: Warm, no lesions or rashes    CT scan abd/pelvis -- 07/30/14 --  COMPARISON: CT scan 09/28/2011  FINDINGS:  Lower chest: The lung bases are clear of acute process. There is  some fullness in the left infrahilar region. I do not see the left  lower lobe bronchus very well and could not exclude the possibility  of an endobronchial lesion or mucoid impaction. I would recommend a  contrast-enhanced chest CT for further evaluation.  The E heart is normal in size. No pericardial effusion. Three-vessel  coronary artery calcifications are noted. The distal esophagus is  grossly normal.  Hepatobiliary: The liver is unremarkable. No focal lesions or  intrahepatic biliary dilatation. The gallbladder is surgically  absent. No common bile duct dilatation.  Pancreas: Normal.  Spleen: Normal.  Adrenals/Urinary Tract:  The adrenal glands are normal. There are  right renal calculi. No obstructing ureteral calculi. No  hydronephrosis.  Stomach/Bowel: The stomach is not well distended with contrast but  no gross abnormalities are seen. The duodenum, visualized small  bowel and visualized colon are unremarkable. No inflammatory changes  or mass lesions. No obstructive findings.  Vascular/Lymphatic: The aorta demonstrates advanced atherosclerotic  calcifications as do the renal arteries bilaterally. No focal  aneurysm or dissection. Advanced iliac artery calcifications are  also noted. No mesenteric or retroperitoneal mass or adenopathy.  There are small scattered lymph nodes noted.  Other: No abdominal wall mass is identified. On the sagittal  reformatted images the does appear to be a small anterior abdominal  wall hernia containing omental fat just superior to the vitamin-E  capsule.  Musculoskeletal: The bony structures are intact. Advanced  degenerative changes are noted in the lumbar spine.  IMPRESSION:  1. No acute abdominal findings, mass lesions or adenopathy  2. Small ventral abdominal wall hernia containing omental fat. No  complicating features are demonstrated.  3. Advanced atherosclerotic calcifications involving the aorta and  branch vessels. Three-vessel coronary artery calcifications.  4. Right renal calculi and bilateral renal artery calcifications.  5. Infrahilar fullness on the left side. Could not exclude  endobronchial tumor or mucoid impaction. Recommend a dedicated  contrast-enhanced chest CT for further evaluation.         Assessment & Plan:  Tobacco use disorder At risk for COPD. Will check spirometry here today prior to planned anesthesia  Mass of lower lobe of left lung Very interesting that he carries a hx of sarcoidosis. The LLL lesion is in the region of nodal tissue. He hasn't had a typical clinical course for sarcoid but it is possible that the new lesion relates to  this. More likely that this lesion represents malignancy. Need to set up FOB + EBUS to assess L sided lesion.

## 2014-08-11 NOTE — Patient Instructions (Signed)
We will perform spirometry today to assess your lung function We will schedule a bronchoscopy with endobronchial ultrasound to obtain a left lung biopsy. Follow with Dr Lamonte Sakai in 1 month

## 2014-08-11 NOTE — Assessment & Plan Note (Addendum)
Very interesting that he carries a hx of sarcoidosis. The LLL lesion is in the region of nodal tissue. He hasn't had a typical clinical course for sarcoid but it is possible that the new lesion relates to this. More likely that this lesion represents malignancy. Need to set up FOB + EBUS to assess L sided lesion.

## 2014-08-12 ENCOUNTER — Telehealth: Payer: Self-pay | Admitting: Emergency Medicine

## 2014-08-12 NOTE — Telephone Encounter (Signed)
Spoke with pt - He is requesting status of EBUS.  Reports he spoke with a [facility] yesterday regarding this.  PCCs, pls advise.  Pt aware disc will be given to RB.  Will forward msg to [place] as FYI.

## 2014-08-12 NOTE — Telephone Encounter (Signed)
Called pt. Aware of below. Please advise [facility] thanks

## 2014-08-12 NOTE — Telephone Encounter (Signed)
Pt is asking for this to be scheduled tomorrow & can be reached at [phone].  Per Hebert Soho will need to schedule this & [facility] is out of the office until tomorrow.  Satira Anis

## 2014-08-12 NOTE — Telephone Encounter (Signed)
Pt dropped off CD containing CT results Pt aware that Dr Lamonte Sakai is in office this PM and we will let him know disc is here for review.   Please advise Dr Lamonte Sakai, CT disc is in your look at.

## 2014-08-12 NOTE — Telephone Encounter (Signed)
Pt stated he is supposed to be getting scheduled for a procedure [phone]

## 2014-08-13 NOTE — Telephone Encounter (Signed)
EBUS scheduled for 08/31/14@7 :30am @[facility]  pt is aware Joellen Jersey

## 2014-08-13 NOTE — Telephone Encounter (Signed)
Patient calling wanting to schedule bronch.  [phone]

## 2014-08-13 NOTE — Telephone Encounter (Signed)
Please advise [facility] thanks

## 2014-08-14 ENCOUNTER — Encounter: Payer: Self-pay | Admitting: Emergency Medicine

## 2014-08-14 NOTE — Telephone Encounter (Signed)
10.16.15 mychart message from pt:  Dr Lamonte Sakai,        I'm having a skin flare-up. Should I see my dermatologist about a prescription for prednasone to clear it? The reason I ask is it may clear the mass indication in the lung if it is sarcoidosis.        Regards,        Spencer Long    Dr Lamonte Sakai please advise, thank you.

## 2014-08-17 ENCOUNTER — Encounter: Payer: Self-pay | Admitting: Emergency Medicine

## 2014-08-17 ENCOUNTER — Encounter (INDEPENDENT_AMBULATORY_CARE_PROVIDER_SITE_OTHER): Payer: Self-pay

## 2014-08-18 ENCOUNTER — Encounter (HOSPITAL_COMMUNITY): Payer: Self-pay | Admitting: Pharmacy Technician

## 2014-08-18 ENCOUNTER — Encounter (HOSPITAL_COMMUNITY): Payer: Self-pay | Admitting: *Deleted

## 2014-08-23 NOTE — Anesthesia Preprocedure Evaluation (Addendum)
Anesthesia Evaluation  Patient identified by MRN, date of birth, ID band Patient awake    Reviewed: Allergy & Precautions, H&P , NPO status , Patient's Chart, lab work & pertinent test results, reviewed documented beta blocker date and time   History of Anesthesia Complications Negative for: history of anesthetic complications  Airway Mallampati: I TM Distance: >3 FB Neck ROM: Full    Dental no notable dental hx. (+) Teeth Intact,    Pulmonary COPDCurrent Smoker,  breath sounds clear to auscultation  Pulmonary exam normal       Cardiovascular hypertension, Pt. on medications and Pt. on home beta blockers Rhythm:Regular Rate:Normal     Neuro/Psych negative neurological ROS     GI/Hepatic GERD-  Medicated,  Endo/Other    Renal/GU      Musculoskeletal   Abdominal   Peds  Hematology   Anesthesia Other Findings   Reproductive/Obstetrics                          Anesthesia Physical  Anesthesia Plan  ASA: III  Anesthesia Plan: General   Post-op Pain Management:    Induction: Intravenous  Airway Management Planned: Oral ETT  Additional Equipment:   Intra-op Plan:   Post-operative Plan: Extubation in OR  Informed Consent: I have reviewed the patients History and Physical, chart, labs and discussed the procedure including the risks, benefits and alternatives for the proposed anesthesia with the patient or authorized representative who has indicated his/her understanding and acceptance.   Dental advisory given  Plan Discussed with: CRNA  Anesthesia Plan Comments:         Anesthesia Quick Evaluation

## 2014-08-24 ENCOUNTER — Ambulatory Visit (HOSPITAL_COMMUNITY): Payer: Managed Care, Other (non HMO) | Admitting: Anesthesiology

## 2014-08-24 ENCOUNTER — Encounter (HOSPITAL_COMMUNITY): Payer: Managed Care, Other (non HMO) | Admitting: Anesthesiology

## 2014-08-24 ENCOUNTER — Ambulatory Visit (HOSPITAL_COMMUNITY)
Admission: RE | Admit: 2014-08-24 | Discharge: 2014-08-24 | Disposition: A | Discharge status: Home / Self Care | Payer: Managed Care, Other (non HMO) | Source: Ambulatory Visit | Attending: Emergency Medicine | Admitting: Emergency Medicine

## 2014-08-24 ENCOUNTER — Encounter (HOSPITAL_COMMUNITY): Payer: Self-pay | Admitting: *Deleted

## 2014-08-24 ENCOUNTER — Encounter (HOSPITAL_COMMUNITY)
Admission: RE | Disposition: A | Discharge status: Home / Self Care | Payer: Self-pay | Source: Ambulatory Visit | Attending: Emergency Medicine

## 2014-08-24 DIAGNOSIS — K439 Ventral hernia without obstruction or gangrene: Secondary | ICD-10-CM | POA: Diagnosis not present

## 2014-08-24 DIAGNOSIS — N200 Calculus of kidney: Secondary | ICD-10-CM | POA: Diagnosis not present

## 2014-08-24 DIAGNOSIS — Z806 Family history of leukemia: Secondary | ICD-10-CM | POA: Insufficient documentation

## 2014-08-24 DIAGNOSIS — K219 Gastro-esophageal reflux disease without esophagitis: Secondary | ICD-10-CM | POA: Diagnosis not present

## 2014-08-24 DIAGNOSIS — F17210 Nicotine dependence, cigarettes, uncomplicated: Secondary | ICD-10-CM | POA: Diagnosis not present

## 2014-08-24 DIAGNOSIS — I700 Atherosclerosis of aorta: Secondary | ICD-10-CM | POA: Insufficient documentation

## 2014-08-24 DIAGNOSIS — I10 Essential (primary) hypertension: Secondary | ICD-10-CM | POA: Diagnosis not present

## 2014-08-24 DIAGNOSIS — J984 Other disorders of lung: Secondary | ICD-10-CM

## 2014-08-24 DIAGNOSIS — R918 Other nonspecific abnormal finding of lung field: Secondary | ICD-10-CM | POA: Diagnosis present

## 2014-08-24 DIAGNOSIS — C7A8 Other malignant neuroendocrine tumors: Secondary | ICD-10-CM | POA: Diagnosis not present

## 2014-08-24 DIAGNOSIS — Z803 Family history of malignant neoplasm of breast: Secondary | ICD-10-CM | POA: Insufficient documentation

## 2014-08-24 DIAGNOSIS — Z888 Allergy status to other drugs, medicaments and biological substances status: Secondary | ICD-10-CM | POA: Diagnosis not present

## 2014-08-24 HISTORY — DX: Sarcoidosis, unspecified: D86.9

## 2014-08-24 HISTORY — DX: Hypo-osmolality and hyponatremia: E87.1

## 2014-08-24 HISTORY — PX: ENDOBRONCHIAL ULTRASOUND: SHX5096

## 2014-08-24 SURGERY — ENDOBRONCHIAL ULTRASOUND (EBUS)
Anesthesia: General | Laterality: Bilateral

## 2014-08-24 MED ORDER — PROPOFOL 10 MG/ML IV BOLUS
INTRAVENOUS | Status: DC | PRN
  Administered 2014-08-24: 150 mg via INTRAVENOUS

## 2014-08-24 MED ORDER — ROCURONIUM BROMIDE 100 MG/10ML IV SOLN
INTRAVENOUS | Status: AC
  Filled 2014-08-24: qty 1

## 2014-08-24 MED ORDER — PHENYLEPHRINE HCL 10 MG/ML IJ SOLN
INTRAMUSCULAR | Status: DC | PRN
  Administered 2014-08-24: 40 ug via INTRAVENOUS
  Administered 2014-08-24 (×2): 80 ug via INTRAVENOUS
  Administered 2014-08-24: 40 ug via INTRAVENOUS

## 2014-08-24 MED ORDER — FENTANYL CITRATE 0.05 MG/ML IJ SOLN
INTRAMUSCULAR | Status: DC | PRN
  Administered 2014-08-24 (×2): 50 ug via INTRAVENOUS

## 2014-08-24 MED ORDER — EPHEDRINE SULFATE 50 MG/ML IJ SOLN
INTRAMUSCULAR | Status: AC
  Filled 2014-08-24: qty 1

## 2014-08-24 MED ORDER — PROPOFOL 10 MG/ML IV BOLUS
INTRAVENOUS | Status: AC
  Filled 2014-08-24: qty 20

## 2014-08-24 MED ORDER — GLYCOPYRROLATE 0.2 MG/ML IJ SOLN
INTRAMUSCULAR | Status: DC | PRN
  Administered 2014-08-24: 0.6 mg via INTRAVENOUS

## 2014-08-24 MED ORDER — MIDAZOLAM HCL 5 MG/5ML IJ SOLN
INTRAMUSCULAR | Status: DC | PRN
  Administered 2014-08-24: 2 mg via INTRAVENOUS

## 2014-08-24 MED ORDER — LIDOCAINE HCL (CARDIAC) 20 MG/ML IV SOLN
INTRAVENOUS | Status: DC | PRN
  Administered 2014-08-24: 50 mg via INTRAVENOUS

## 2014-08-24 MED ORDER — NEOSTIGMINE METHYLSULFATE 10 MG/10ML IV SOLN
INTRAVENOUS | Status: AC
  Filled 2014-08-24: qty 1

## 2014-08-24 MED ORDER — LACTATED RINGERS IV SOLN
INTRAVENOUS | Status: DC | PRN
  Administered 2014-08-24 (×2): via INTRAVENOUS

## 2014-08-24 MED ORDER — NEOSTIGMINE METHYLSULFATE 10 MG/10ML IV SOLN
INTRAVENOUS | Status: DC | PRN
  Administered 2014-08-24: 4 mg via INTRAVENOUS

## 2014-08-24 MED ORDER — ROCURONIUM BROMIDE 100 MG/10ML IV SOLN
INTRAVENOUS | Status: DC | PRN
  Administered 2014-08-24: 35 mg via INTRAVENOUS
  Administered 2014-08-24: 10 mg via INTRAVENOUS

## 2014-08-24 MED ORDER — LIDOCAINE HCL (CARDIAC) 20 MG/ML IV SOLN
INTRAVENOUS | Status: AC
  Filled 2014-08-24: qty 5

## 2014-08-24 MED ORDER — FENTANYL CITRATE 0.05 MG/ML IJ SOLN
INTRAMUSCULAR | Status: AC
  Filled 2014-08-24: qty 2

## 2014-08-24 MED ORDER — GLYCOPYRROLATE 0.2 MG/ML IJ SOLN
INTRAMUSCULAR | Status: AC
  Filled 2014-08-24: qty 3

## 2014-08-24 MED ORDER — ONDANSETRON HCL 4 MG/2ML IJ SOLN
INTRAMUSCULAR | Status: DC | PRN
  Administered 2014-08-24: 4 mg via INTRAVENOUS

## 2014-08-24 MED ORDER — ONDANSETRON HCL 4 MG/2ML IJ SOLN
INTRAMUSCULAR | Status: AC
  Filled 2014-08-24: qty 2

## 2014-08-24 MED ORDER — EPHEDRINE SULFATE 50 MG/ML IJ SOLN
INTRAMUSCULAR | Status: DC | PRN
  Administered 2014-08-24: 5 mg via INTRAVENOUS

## 2014-08-24 MED ORDER — MEPERIDINE HCL 100 MG/ML IJ SOLN: 6.2500 mg | INTRAMUSCULAR | Status: DC | PRN

## 2014-08-24 MED ORDER — MIDAZOLAM HCL 2 MG/2ML IJ SOLN
INTRAMUSCULAR | Status: AC
  Filled 2014-08-24: qty 2

## 2014-08-24 MED ORDER — PHENYLEPHRINE 40 MCG/ML (10ML) SYRINGE FOR IV PUSH (FOR BLOOD PRESSURE SUPPORT)
PREFILLED_SYRINGE | INTRAVENOUS | Status: AC
  Filled 2014-08-24: qty 10

## 2014-08-24 MED ORDER — ONDANSETRON HCL 4 MG/2ML IJ SOLN: 4.0000 mg | Freq: Once | INTRAMUSCULAR | Status: DC | PRN

## 2014-08-24 NOTE — H&P (View-Only) (Signed)
Subjective:    Patient ID: Spencer Long, male    DOB: 1950/11/21, 63 y.o.   MRN: [id number]  HPI 63 yo smoker (60 pk-yrs), hx of HTN, GERD, severe contact dermatitis. He also carries a dx of possible sarcoidosis following a nodal biopsy many years ago. He underwent a CT abd and then a CT chest 08/07/14 that showed a LLL bronchial obstruction vs mass. He describes some fatigue, difficulty climbing stairs over the last few months. Denies severe cough except in the am relating to PND. No hemoptysis. No CP. He is referred to eval the lung nodule.    Review of Systems  Constitutional: Negative.   HENT: Negative.   Eyes: Negative.   Respiratory: Positive for shortness of breath.   Cardiovascular: Negative.   Gastrointestinal: Negative.   Endocrine: Negative.   Genitourinary: Negative.   Allergic/Immunologic: Negative.   Neurological: Negative.   Hematological: Negative.   Psychiatric/Behavioral: Negative.     Past Medical History  Diagnosis Date  . Hypertension     under control, has been on med. x 64-20 yrs.  [name] GERD (gastroesophageal reflux disease)   . Infection of elbow     right     Family History  Problem Relation Age of Onset  . Cancer Father     Leukemia  . Cancer Mother     breast  . Dementia Mother      History   Social History  . Marital Status: Married    Spouse Name: N/A    Number of Children: 1  . Years of Education: N/A   Occupational History  .  [employer]   Social History Main Topics  . Smoking status: Current Every Day Smoker -- 1.50 packs/day for 40 years    Types: Cigarettes  . Smokeless tobacco: Never Used  . Alcohol Use: Yes     Comment: 2-4 drinks daily  . Drug Use: No  . Sexual Activity:    Other Topics Concern  . Not on file   Social History Narrative  . No narrative on file     Allergies  Allergen Reactions  . Varenicline   . Isothiazolinone Chloride Rash  . Quaternium-15 Rash     Outpatient Prescriptions Prior  to Visit  Medication Sig Dispense Refill  . atenolol (TENORMIN) 25 MG tablet Take 25 mg by mouth 2 (two) times daily.        [name] omeprazole (PRILOSEC) 20 MG capsule Take 40 mg by mouth daily. AM      . meloxicam (MOBIC) 15 MG tablet Take 15 mg by mouth daily. AM       . traMADol (ULTRAM) 50 MG tablet 1-2 tabs PO q6 hours prn pain Maximum dose= 8 tablets per day  40 tablet  0   No facility-administered medications prior to visit.         Objective:   Physical Exam Filed Vitals:   08/11/14 1509  BP: 100/72  Pulse: 81  Temp: 97.2 F (36.2 C)  TempSrc: Oral  Height: 5\' 9"  (1.753 m)  Weight: 147 lb 3.2 oz (66.769 kg)  SpO2: 94%   Gen: Pleasant, well-nourished, in no distress,  normal affect  ENT: No lesions,  mouth clear,  oropharynx clear, no postnasal drip  Neck: No JVD, no TMG, no carotid bruits  Lungs: No use of accessory muscles, no dullness to percussion, clear without rales or rhonchi  Cardiovascular: RRR, heart sounds normal, no murmur or gallops, no peripheral edema  Musculoskeletal: No  deformities, no cyanosis or clubbing  Neuro: alert, non focal  Skin: Warm, no lesions or rashes    CT scan abd/pelvis -- 07/30/14 --  COMPARISON: CT scan 09/28/2011  FINDINGS:  Lower chest: The lung bases are clear of acute process. There is  some fullness in the left infrahilar region. I do not see the left  lower lobe bronchus very well and could not exclude the possibility  of an endobronchial lesion or mucoid impaction. I would recommend a  contrast-enhanced chest CT for further evaluation.  The E heart is normal in size. No pericardial effusion. Three-vessel  coronary artery calcifications are noted. The distal esophagus is  grossly normal.  Hepatobiliary: The liver is unremarkable. No focal lesions or  intrahepatic biliary dilatation. The gallbladder is surgically  absent. No common bile duct dilatation.  Pancreas: Normal.  Spleen: Normal.  Adrenals/Urinary Tract:  The adrenal glands are normal. There are  right renal calculi. No obstructing ureteral calculi. No  hydronephrosis.  Stomach/Bowel: The stomach is not well distended with contrast but  no gross abnormalities are seen. The duodenum, visualized small  bowel and visualized colon are unremarkable. No inflammatory changes  or mass lesions. No obstructive findings.  Vascular/Lymphatic: The aorta demonstrates advanced atherosclerotic  calcifications as do the renal arteries bilaterally. No focal  aneurysm or dissection. Advanced iliac artery calcifications are  also noted. No mesenteric or retroperitoneal mass or adenopathy.  There are small scattered lymph nodes noted.  Other: No abdominal wall mass is identified. On the sagittal  reformatted images the does appear to be a small anterior abdominal  wall hernia containing omental fat just superior to the vitamin-E  capsule.  Musculoskeletal: The bony structures are intact. Advanced  degenerative changes are noted in the lumbar spine.  IMPRESSION:  1. No acute abdominal findings, mass lesions or adenopathy  2. Small ventral abdominal wall hernia containing omental fat. No  complicating features are demonstrated.  3. Advanced atherosclerotic calcifications involving the aorta and  branch vessels. Three-vessel coronary artery calcifications.  4. Right renal calculi and bilateral renal artery calcifications.  5. Infrahilar fullness on the left side. Could not exclude  endobronchial tumor or mucoid impaction. Recommend a dedicated  contrast-enhanced chest CT for further evaluation.         Assessment & Plan:  Tobacco use disorder At risk for COPD. Will check spirometry here today prior to planned anesthesia  Mass of lower lobe of left lung Very interesting that he carries a hx of sarcoidosis. The LLL lesion is in the region of nodal tissue. He hasn't had a typical clinical course for sarcoid but it is possible that the new lesion relates to  this. More likely that this lesion represents malignancy. Need to set up FOB + EBUS to assess L sided lesion.

## 2014-08-24 NOTE — Op Note (Signed)
Video Bronchoscopy with Endobronchial Ultrasound Procedure Note  Date of Operation: 08/24/2014  Pre-op Diagnosis: LLL mass  Post-op Diagnosis: same  Surgeon: Baltazar Apo  Assistants: none  Anesthesia: General endotracheal anesthesia  Operation: Flexible video fiberoptic bronchoscopy with endobronchial ultrasound and biopsies.  Estimated Blood Loss: 14DC  Complications: none apparent  Indications and History: Spencer Long is a 63 y.o. male with Hx tobacco use. He carries a possible dx of sarcoidosis from prior evaluations and imaging. He was referred for evaluation of a L hilar mass lesion on CT scan. Recommendation was made to pursue bronchoscopy with biopsies.  The risks, benefits, complications, treatment options and expected outcomes were discussed with the patient.  The possibilities of pneumothorax, pneumonia, reaction to medication, pulmonary aspiration, perforation of a viscus, bleeding, failure to diagnose a condition and creating a complication requiring transfusion or operation were discussed with the patient who freely signed the consent.    Description of Procedure: The patient was examined in the preoperative area and history and data from the preprocedure consultation were reviewed. It was deemed appropriate to proceed.  The patient was taken to [facility], identified as [name] and the procedure verified as Flexible Video Fiberoptic Bronchoscopy.  A Time Out was held and the above information confirmed. General anesthesia was initiated and the patient  was orally intubated. The video fiberoptic bronchoscope was introduced via the endotracheal tube and a general inspection was performed which showed A pale, friable endobronchial lesion in the LLL. This was biopsies and brushed for pathology and cytology. The standard scope was then withdrawn and the endobronchial ultrasound was used to identify and characterize the peritracheal, hilar and bronchial lymph nodes.  Inspection showed no enlarged mediastinal nodes. An irregularly shaped mass was identified by EBUS in the LLL and Needle biopsies were performed under real-time US guidance to be sent for cytology. The patient tolerated the procedure well without apparent complications. There was no significant blood loss. The bronchoscope was withdrawn. Anesthesia was reversed and the patient was taken to the PACU for recovery.   Samples: 1. Endobronchial Biopsies from LLL 2. Endobronchial Brushings from LLL  3. Wang needle biopsies from LLL mass  Plans:  The patient will be discharged from the PACU to home when recovered from anesthesia. We will review the cytology, pathology results with the patient when they become available. Outpatient followup will be with Dr Lamonte Sakai.    Baltazar Apo, MD, PhD 08/24/2014, 8:42 AM [facility] [phone] or if no answer [phone]

## 2014-08-24 NOTE — Anesthesia Postprocedure Evaluation (Signed)
Anesthesia Post Note  Patient: Spencer Long  Procedure(s) Performed: Procedure(s) (LRB): ENDOBRONCHIAL ULTRASOUND (Bilateral)  Anesthesia type: General  Patient location: PACU  Post pain: Pain level controlled  Post assessment: Post-op Vital signs reviewed  Last Vitals: BP 111/42  Pulse 66  Temp(Src) 37 C (Oral)  Resp 19  SpO2 97%  Post vital signs: Reviewed  Level of consciousness: sedated  Complications: No apparent anesthesia complications

## 2014-08-24 NOTE — Interval H&P Note (Signed)
PCCM Interval Note  Pt presents for L hilar mass and LAD. Has been stable since last OV - no change in breathing, cough, no pain. He has been given a dx of sarcoidosis in the past.   Filed Vitals:   08/24/14 0656  BP: 167/72  Pulse: 70  Temp: 98.6 F (37 C)  TempSrc: Oral  Resp: 13  SpO2: 97%   Plans - will proceed with FOB + EBUS, needle bx's  Baltazar Apo, MD, PhD 08/24/2014, 7:22 AM [facility] [phone] or if no answer [phone]

## 2014-08-24 NOTE — Transfer of Care (Signed)
Immediate Anesthesia Transfer of Care Note  Patient: Spencer Long  Procedure(s) Performed: Procedure(s): ENDOBRONCHIAL ULTRASOUND (Bilateral)  Patient Location: PACU  Anesthesia Type:General  Level of Consciousness: awake, alert  and oriented  Airway & Oxygen Therapy: Patient Spontanous Breathing and Patient connected to face mask oxygen  Post-op Assessment: Report given to PACU RN and Post -op Vital signs reviewed and stable  Post vital signs: Reviewed and stable  Complications: No apparent anesthesia complications

## 2014-08-24 NOTE — Discharge Instructions (Signed)
Flexible Bronchoscopy, Care After Refer to this sheet in the next few weeks. These instructions provide you with information on caring for yourself after your procedure. Your health care provider may also give you more specific instructions. Your treatment has been planned according to current medical practices, but problems sometimes occur. Call your health care provider if you have any problems or questions after your procedure.  WHAT TO EXPECT AFTER THE PROCEDURE It is normal to have the following symptoms for 24-48 hours after the procedure:   Increased cough.  Low-grade fever.  Sore throat or hoarse voice.  Small streaks of blood in your thick spit (sputum) if tissue samples were taken (biopsy). HOME CARE INSTRUCTIONS   Do not eat or drink anything for 2 hours after your procedure. Your nose and throat were numbed by medicine. If you try to eat or drink before the medicine wears off, food or drink could go into your lungs or you could burn yourself. After the numbness is gone and your cough and gag reflexes have returned, you may eat soft food and drink liquids slowly.   The day after the procedure, you can go back to your normal diet.   You may resume normal activities.   Keep all follow-up visits as directed by your health care provider. It is important to keep all your appointments, especially if tissue samples were taken for testing (biopsy). SEEK IMMEDIATE MEDICAL CARE IF:   You have increasing shortness of breath.   You become light-headed or faint.   You have chest pain.   You have any new concerning symptoms.  You cough up more than a small amount of blood.  The amount of blood you cough up increases. MAKE SURE YOU:  Understand these instructions.  Will watch your condition.  Will get help right away if you are not doing well or get worse. Document Released: 05/05/2005 Document Revised: 03/02/2014 Document Reviewed: 06/20/2013 [facility] Patient Information  2015 [place], [place]. This information is not intended to replace advice given to you by your health care provider. Make sure you discuss any questions you have with your health care provider.   Please call our office for any problems or questions. [phone].   General Anesthesia, Care After Refer to this sheet in the next few weeks. These instructions provide you with information on caring for yourself after your procedure. Your health care provider may also give you more specific instructions. Your treatment has been planned according to current medical practices, but problems sometimes occur. Call your health care provider if you have any problems or questions after your procedure. WHAT TO EXPECT AFTER THE PROCEDURE After the procedure, it is typical to experience:  Sleepiness.  Nausea and vomiting. HOME CARE INSTRUCTIONS  For the first 24 hours after general anesthesia:  Have a responsible person with you.  Do not drive a car. If you are alone, do not take public transportation.  Do not drink alcohol.  Do not take medicine that has not been prescribed by your health care provider.  Do not sign important papers or make important decisions.  You may resume a normal diet and activities as directed by your health care provider.  Change bandages (dressings) as directed.  If you have questions or problems that seem related to general anesthesia, call the hospital and ask for the anesthetist or anesthesiologist on call. SEEK MEDICAL CARE IF:  You have nausea and vomiting that continue the day after anesthesia.  You develop a rash. SEEK IMMEDIATE  MEDICAL CARE IF:   You have difficulty breathing.  You have chest pain.  You have any allergic problems. Document Released: 01/22/2001 Document Revised: 10/21/2013 Document Reviewed: 05/01/2013 [facility] Patient Information 2015 [place], [place]. This information is not intended to replace advice given to you by your health care  provider. Make sure you discuss any questions you have with your health care provider.

## 2014-08-24 NOTE — Progress Notes (Signed)
Video Bronchoscopy done along with Ebus Procedure  Intervention Bronchial Biopsy done Intervention Bronchial brushings done  Procedure tolerated well  Baltazar Apo, MD, PhD 08/25/2014, 5:20 PM [facility] [phone] or if no answer [phone]

## 2014-08-25 ENCOUNTER — Encounter (HOSPITAL_COMMUNITY): Payer: Self-pay | Admitting: Emergency Medicine

## 2014-08-26 ENCOUNTER — Telehealth: Payer: Self-pay | Admitting: Emergency Medicine

## 2014-08-26 DIAGNOSIS — C3490 Malignant neoplasm of unspecified part of unspecified bronchus or lung: Secondary | ICD-10-CM

## 2014-08-26 NOTE — Telephone Encounter (Signed)
Dr. Lamonte Sakai please advise on lung biopsy results before we call patient, as I do not see any results to relay to patient.  Thanks!

## 2014-08-27 NOTE — Telephone Encounter (Signed)
I discussed the biopsy results with the patient and his wife by phone. His biopsies showed small cell lung cancer. We talked about the diagnosis, prognosis and possible treatments. I will refer him to see Dr. [name] at [facility] vs at [facility]

## 2014-08-27 NOTE — Telephone Encounter (Signed)
Spoke with patient-aware that [place] is in the office this afternoon to advise on results. Will forward to RB with red flag as patient is anxious to know something today.

## 2014-08-27 NOTE — Telephone Encounter (Signed)
Pt returning call.Spencer Long ° °

## 2014-08-28 ENCOUNTER — Encounter: Payer: Self-pay | Admitting: Emergency Medicine

## 2014-08-28 ENCOUNTER — Telehealth: Payer: Self-pay | Admitting: *Deleted

## 2014-08-28 NOTE — Telephone Encounter (Signed)
Called patient.  I spoke with wife.  Gave her an appt to see Dr. [name] on 09/03/14 at 11:30.  She verbalized understanding of appt time and place.

## 2014-08-31 ENCOUNTER — Encounter: Payer: Self-pay | Admitting: Pulmonary Disease

## 2014-08-31 ENCOUNTER — Ambulatory Visit (INDEPENDENT_AMBULATORY_CARE_PROVIDER_SITE_OTHER): Payer: Managed Care, Other (non HMO) | Admitting: Pulmonary Disease

## 2014-08-31 ENCOUNTER — Encounter (HOSPITAL_COMMUNITY): Payer: Managed Care, Other (non HMO)

## 2014-08-31 VITALS — BP 140/80 | HR 75 | Temp 98.1°F | Ht 69.0 in | Wt 150.0 lb

## 2014-08-31 DIAGNOSIS — C3492 Malignant neoplasm of unspecified part of left bronchus or lung: Secondary | ICD-10-CM

## 2014-08-31 DIAGNOSIS — F17200 Nicotine dependence, unspecified, uncomplicated: Secondary | ICD-10-CM

## 2014-08-31 DIAGNOSIS — Z720 Tobacco use: Secondary | ICD-10-CM

## 2014-08-31 DIAGNOSIS — C3490 Malignant neoplasm of unspecified part of unspecified bronchus or lung: Secondary | ICD-10-CM | POA: Insufficient documentation

## 2014-08-31 MED ORDER — BUPROPION HCL ER (SR) 150 MG PO TB12: ORAL_TABLET | ORAL | Status: DC

## 2014-08-31 NOTE — Assessment & Plan Note (Addendum)
Today we discussed various treatment options for tobacco use. He is not interested in using Chantix because of the severe side effects that he felt earlier when using them.  He is interested in nicotine replacement and Wellbutrin. We discussed the very low risk of seizure with Wellbutrin.  Plan: -Stop smoking -Nicotine patch  21 micrograms -Wellbutrin 150 mg twice a day

## 2014-08-31 NOTE — Progress Notes (Signed)
Subjective:    Patient ID: Spencer Long, male    DOB: April 22, 1951, 63 y.o.   MRN: [id number]  HPI   08/31/2014 routine office visit> Mr. Aspinall is here to see me today to follow-up from his bronchoscopy which was performed by my partner last week. The biopsy from the lymph node as well as the left lower lobe mass demonstrated small cell lung cancer. He is supposed to see his new oncologist this week.  Today he primarily has questions about smoking cessation. He continues to smoke 1-1/2 packs of cigarettes daily and wants to quit. He had very severe side effects from Chantix and does not want to take that again. He has not taken nicotine replacement. He also has questions about whether or not the rash on his arm could be related to his malignancy.  Past Medical History  Diagnosis Date  . Hypertension     under control, has been on med. x 15-20 yrs.  [name] GERD (gastroesophageal reflux disease)   . Infection of elbow     right-no infection at present , but has skin lesions present  . Sarcoidosis     "tends to have skin flareups" and presently experiencing now"feet, legs and arms"  . Chronic hyponatremia     Dr. Buddy Duty follows     Review of Systems     Objective:   Physical Exam  Filed Vitals:   08/31/14 1620  BP: 140/80  Pulse: 75  Temp: 98.1 F (36.7 C)  TempSrc: Oral  Height: 5\' 9"  (1.753 m)  Weight: 150 lb (68.04 kg)  SpO2: 95%   Gen: well appearing, no acute distress HEENT: NCAT, EOMi, OP clear,  PULM: Wheezing bilaterally CV: RRR, no mgr, no JVD AB: BS+, soft, nontender Ext: warm, no edema, no clubbing, no cyanosis Derm: no rash or skin breakdown Neuro: A&Ox4, MAEW     Assessment & Plan:   Small cell lung cancer He was recently diagnosed with small cell lung cancer. He is supposed to see Dr. [name] this week at the cancer center. Questions were answered today regarding this diagnosis.  Tobacco use disorder Today we discussed various treatment options for  tobacco use. He is not interested in using Chantix because of the severe side effects that he felt earlier when using them.  He is interested in nicotine replacement and Wellbutrin. We discussed the very low risk of seizure with Wellbutrin.  Plan: -Stop smoking -Nicotine patch  21 micrograms -Wellbutrin 150 mg twice a day   Updated Medication List Outpatient Encounter Prescriptions as of 08/31/2014  Medication Sig  . acetaminophen (TYLENOL) 500 MG tablet Take 1,000 mg by mouth 3 (three) times daily.   [name] amLODipine (NORVASC) 10 MG tablet Take 10 mg by mouth every morning.   [name] aspirin EC 81 MG tablet Take 81 mg by mouth every morning.   [name] atenolol (TENORMIN) 25 MG tablet Take 25 mg by mouth 2 (two) times daily.    . Azilsartan Medoxomil (EDARBI) 80 MG TABS Take 1 tablet by mouth every morning.  . clobetasol cream (TEMOVATE) 6.01 % Apply 1 application topically 2 (two) times daily as needed.  . diclofenac (VOLTAREN) 75 MG EC tablet Take 75 mg by mouth every 12 (twelve) hours.   [name] dicyclomine (BENTYL) 20 MG tablet Take 20 mg by mouth every 4 (four) hours as needed for spasms.   . folic acid (FOLVITE) 1 MG tablet Take 1 mg by mouth 2 (two) times daily.  [name] glucosamine-chondroitin 500-400  MG tablet Take 1 tablet by mouth 2 (two) times daily.  . methotrexate (RHEUMATREX) 2.5 MG tablet Take 15 mg by mouth once a week.   . Multiple Vitamins-Minerals (MULTIVITAMIN ADULTS 50+ PO) Take 1 tablet by mouth every morning.   [name] omeprazole (PRILOSEC) 40 MG capsule Take 40 mg by mouth every morning.  . Probiotic Product (PROBIOTIC DAILY PO) Take 1 tablet by mouth every morning.   . sodium chloride 1 G tablet Take 1 g by mouth 3 (three) times daily.  . white petrolatum (VASELINE) GEL Apply 1 application topically daily as needed for lip care or dry skin.  [name] buPROPion (WELLBUTRIN SR) 150 MG 12 hr tablet Take 150mg  daily for one week, then take 150mg  by mouth twice a day

## 2014-08-31 NOTE — Patient Instructions (Signed)
Take the Wellbutrin 150mg  daily for one week, then take 150mg  twice a day Use a 33mcg nicotine patch Call 1-800-QUIT-NOW for free nicotine replacement from the state of [place] F/U with Dr. Lamonte Sakai in 2 months

## 2014-08-31 NOTE — Assessment & Plan Note (Signed)
He was recently diagnosed with small cell lung cancer. He is supposed to see Dr. [name] this week at the cancer center. Questions were answered today regarding this diagnosis.

## 2014-09-01 ENCOUNTER — Telehealth: Payer: Self-pay | Admitting: Emergency Medicine

## 2014-09-01 MED ORDER — NICOTINE 21 MG/24HR TD PT24: 21.0000 mg | MEDICATED_PATCH | Freq: Every day | TRANSDERMAL | Status: DC

## 2014-09-01 NOTE — Telephone Encounter (Signed)
Per 08/31/14 BQ:         Patient Instructions       Take the Wellbutrin 150mg  daily for one week, then take 150mg  twice a day Use a 55mcg nicotine patch Call 1-800-QUIT-NOW for free nicotine replacement from the state of [place] F/U with Dr. Lamonte Sakai in 2 months   Called pt. He reports his insurance will cover the nicotine patches if we send in RX tot he pharm. I have done so. Nothing further needed

## 2014-09-03 ENCOUNTER — Encounter: Payer: Self-pay | Admitting: Internal Medicine

## 2014-09-03 ENCOUNTER — Ambulatory Visit (HOSPITAL_BASED_OUTPATIENT_CLINIC_OR_DEPARTMENT_OTHER): Payer: Managed Care, Other (non HMO) | Admitting: Internal Medicine

## 2014-09-03 ENCOUNTER — Telehealth: Payer: Self-pay | Admitting: Internal Medicine

## 2014-09-03 ENCOUNTER — Telehealth: Payer: Self-pay | Admitting: *Deleted

## 2014-09-03 VITALS — BP 167/71 | HR 76 | Temp 98.5°F | Resp 18 | Ht 68.0 in | Wt 145.9 lb

## 2014-09-03 DIAGNOSIS — C3492 Malignant neoplasm of unspecified part of left bronchus or lung: Secondary | ICD-10-CM

## 2014-09-03 DIAGNOSIS — C7A1 Malignant poorly differentiated neuroendocrine tumors: Secondary | ICD-10-CM

## 2014-09-03 NOTE — Telephone Encounter (Signed)
Per staff message and POF I have scheduled appts. Advised scheduler of appts. JMW  

## 2014-09-03 NOTE — Patient Instructions (Signed)
Smoking Cessation, Tips for Success  If you are ready to quit smoking, congratulations! You have chosen to help yourself be healthier. Cigarettes bring nicotine, tar, carbon monoxide, and other irritants into your body. Your lungs, heart, and blood vessels will be able to work better without these poisons. There are many different ways to quit smoking. Nicotine gum, nicotine patches, a nicotine inhaler, or nicotine nasal spray can help with physical craving. Hypnosis, support groups, and medicines help break the habit of smoking.  WHAT THINGS CAN I DO TO MAKE QUITTING EASIER?   Here are some tips to help you quit for good:  · Pick a date when you will quit smoking completely. Tell all of your friends and family about your plan to quit on that date.  · Do not try to slowly cut down on the number of cigarettes you are smoking. Pick a quit date and quit smoking completely starting on that day.  · Throw away all cigarettes.    · Clean and remove all ashtrays from your home, work, and car.  · On a card, write down your reasons for quitting. Carry the card with you and read it when you get the urge to smoke.  · Cleanse your body of nicotine. Drink enough water and fluids to keep your urine clear or pale yellow. Do this after quitting to flush the nicotine from your body.  · Learn to predict your moods. Do not let a bad situation be your excuse to have a cigarette. Some situations in your life might tempt you into wanting a cigarette.  · Never have "just one" cigarette. It leads to wanting another and another. Remind yourself of your decision to quit.  · Change habits associated with smoking. If you smoked while driving or when feeling stressed, try other activities to replace smoking. Stand up when drinking your coffee. Brush your teeth after eating. Sit in a different chair when you read the paper. Avoid alcohol while trying to quit, and try to drink fewer caffeinated beverages. Alcohol and caffeine may urge you to  smoke.  · Avoid foods and drinks that can trigger a desire to smoke, such as sugary or spicy foods and alcohol.  · Ask people who smoke not to smoke around you.  · Have something planned to do right after eating or having a cup of coffee. For example, plan to take a walk or exercise.  · Try a relaxation exercise to calm you down and decrease your stress. Remember, you may be tense and nervous for the first 2 weeks after you quit, but this will pass.  · Find new activities to keep your hands busy. Play with a pen, coin, or rubber band. Doodle or draw things on paper.  · Brush your teeth right after eating. This will help cut down on the craving for the taste of tobacco after meals. You can also try mouthwash.    · Use oral substitutes in place of cigarettes. Try using lemon drops, carrots, cinnamon sticks, or chewing gum. Keep them handy so they are available when you have the urge to smoke.  · When you have the urge to smoke, try deep breathing.  · Designate your home as a nonsmoking area.  · If you are a heavy smoker, ask your health care provider about a prescription for nicotine chewing gum. It can ease your withdrawal from nicotine.  · Reward yourself. Set aside the cigarette money you save and buy yourself something nice.  · Look for   support from others. Join a support group or smoking cessation program. Ask someone at home or at work to help you with your plan to quit smoking.  · Always ask yourself, "Do I need this cigarette or is this just a reflex?" Tell yourself, "Today, I choose not to smoke," or "I do not want to smoke." You are reminding yourself of your decision to quit.  · Do not replace cigarette smoking with electronic cigarettes (commonly called e-cigarettes). The safety of e-cigarettes is unknown, and some may contain harmful chemicals.  · If you relapse, do not give up! Plan ahead and think about what you will do the next time you get the urge to smoke.  HOW WILL I FEEL WHEN I QUIT SMOKING?  You  may have symptoms of withdrawal because your body is used to nicotine (the addictive substance in cigarettes). You may crave cigarettes, be irritable, feel very hungry, cough often, get headaches, or have difficulty concentrating. The withdrawal symptoms are only temporary. They are strongest when you first quit but will go away within 10-14 days. When withdrawal symptoms occur, stay in control. Think about your reasons for quitting. Remind yourself that these are signs that your body is healing and getting used to being without cigarettes. Remember that withdrawal symptoms are easier to treat than the major diseases that smoking can cause.   Even after the withdrawal is over, expect periodic urges to smoke. However, these cravings are generally short lived and will go away whether you smoke or not. Do not smoke!  WHAT RESOURCES ARE AVAILABLE TO HELP ME QUIT SMOKING?  Your health care provider can direct you to community resources or hospitals for support, which may include:  · Group support.  · Education.  · Hypnosis.  · Therapy.  Document Released: 07/14/2004 Document Revised: 03/02/2014 Document Reviewed: 04/03/2013  [employer]® Patient Information ©2015 [employer]. This information is not intended to replace advice given to you by your health care provider. Make sure you discuss any questions you have with your health care provider.

## 2014-09-03 NOTE — Telephone Encounter (Signed)
gv adn printd appt sched and avs for pt for NOV and Dec...sed added tx...emailed MW to add tx.

## 2014-09-03 NOTE — Progress Notes (Signed)
[place] [phone]   [phone] Thoracic clinic CONSULT NOTE  REFERRING PHYSICIAN: Dr. Baltazar Apo  REASON FOR CONSULTATION:  63 years old white male recently diagnosed with lung cancer  HPI RASHEE MARSCHALL is a 63 y.o. male with a past medical history significant for irritable bowel syndrome as well as history of skin cancer and hypertension. The patient also has a history of sarcoidosis and long history of smoking. He is complaining with persistent abdominal pain and was seen by his primary care physician Dr. Maury Dus. He had CT scan of the abdomen which was performed on 07/30/2014 and it showed infrahilar fullness on the left side, suspicious for endobronchial tumor or mucoid impaction. This was followed by CT scan of the chest performed at [facility] [facility] on 08/07/2014 and showed left lower lobe bronchial obstruction versus mass. The patient was referred to Dr. Lamonte Sakai. On 08/24/2014 he underwent a video bronchoscopy with endobronchial ultrasound and biopsies also left lower lobe mass. 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 The tumor shows positivity with CD56, chromogranin, synaptophysin, thyroid transcription factor-1 and cytokeratin AE1/AE3 and is negative with CD45. The morphology and immunophenotype are consistent with small cell neuroendocrine carcinoma. The patient was referred to me today for evaluation and recommendation regarding treatment of his condition. When seen today he denied having any significant complaints except for bloating and shortness of breath with exertion. The patient also lost around 5 pounds in the last few months. He denied having any significant chest pain, cough or hemoptysis. The patient denied having any nausea or vomiting. He denied having any headache or blurry vision. Family history significant for a mother with breast cancer and father with  leukemia. The patient is married and was accompanied by his wife [name]. He is to work on Hospital doctor. He has a history of smoking one half pack per day for around 48 years and unfortunately he continues to smoke. I strongly encouraged him to quit smoking and offered him to smoke cessation program. The patient also drinks 428 ounces of alcohol every day. He has no history of drug abuse. HPI  Past Medical History  Diagnosis Date  . Hypertension     under control, has been on med. x 15-20 yrs.  [name] GERD (gastroesophageal reflux disease)   . Infection of elbow     right-no infection at present , but has skin lesions present  . Sarcoidosis     "tends to have skin flareups" and presently experiencing now"feet, legs and arms"  . Chronic hyponatremia     Dr. Buddy Duty follows    Past Surgical History  Procedure Laterality Date  . Cholecystectomy  05/2006  . Ulnar nerve repair  01/2008    right; decompression  . Carpal tunnel release  01/2008    right  . Eye surgery  12/2008    correction ectropion, release band, wedge exc., shortening  . I&d extremity  09/12/2011    Procedure: IRRIGATION AND DEBRIDEMENT EXTREMITY;  Surgeon: Tennis Must;  Location: [facility];  Service: Orthopedics;  Laterality: Right;  I&d RIGHT OLECRANON BURSSA   . Tonsillectomy    . Endobronchial ultrasound Bilateral 08/24/2014    Procedure: ENDOBRONCHIAL ULTRASOUND;  Surgeon: Collene Gobble, MD;  Location: [facility];  Service: Cardiopulmonary;  Laterality: Bilateral;    Family History  Problem Relation Age of Onset  . Cancer Father     Leukemia  . Cancer Mother  breast  . Dementia Mother     Social History History  Substance Use Topics  . Smoking status: Current Every Day Smoker -- 1.50 packs/day for 40 years    Types: Cigarettes  . Smokeless tobacco: Never Used  . Alcohol Use: Yes     Comment: 2-4 drinks daily    Allergies  Allergen Reactions  . Varenicline     chantix  .  Isothiazolinone Chloride Rash  . Quaternium-15 Rash    Current Outpatient Prescriptions  Medication Sig Dispense Refill  . acetaminophen (TYLENOL) 500 MG tablet Take 1,000 mg by mouth 3 (three) times daily.     [name] amLODipine (NORVASC) 10 MG tablet Take 10 mg by mouth every morning.     [name] aspirin EC 81 MG tablet Take 81 mg by mouth every morning.     [name] atenolol (TENORMIN) 25 MG tablet Take 25 mg by mouth 2 (two) times daily.      . Azilsartan Medoxomil (EDARBI) 80 MG TABS Take 1 tablet by mouth every morning.    . clobetasol cream (TEMOVATE) 0.96 % Apply 1 application topically 2 (two) times daily as needed.    . diclofenac (VOLTAREN) 75 MG EC tablet Take 75 mg by mouth every 12 (twelve) hours.     [name] dicyclomine (BENTYL) 20 MG tablet Take 20 mg by mouth every 4 (four) hours as needed for spasms.     . folic acid (FOLVITE) 1 MG tablet Take 1 mg by mouth 2 (two) times daily.    [name] glucosamine-chondroitin 500-400 MG tablet Take 1 tablet by mouth 2 (two) times daily.    . methotrexate (RHEUMATREX) 2.5 MG tablet Take 15 mg by mouth once a week.     . Multiple Vitamins-Minerals (MULTIVITAMIN ADULTS 50+ PO) Take 1 tablet by mouth every morning.     [name] omeprazole (PRILOSEC) 40 MG capsule Take 40 mg by mouth every morning.    . Probiotic Product (PROBIOTIC DAILY PO) Take 1 tablet by mouth every morning.     . sodium chloride 1 G tablet Take 1 g by mouth 3 (three) times daily.    . white petrolatum (VASELINE) GEL Apply 1 application topically daily as needed for lip care or dry skin.    [name] buPROPion (WELLBUTRIN SR) 150 MG 12 hr tablet Take 150mg  daily for one week, then take 150mg  by mouth twice a day 60 tablet 2  . nicotine (NICODERM CQ - DOSED IN MG/24 HOURS) 21 mg/24hr patch Place 1 patch (21 mg total) onto the skin daily. 28 patch 0   No current facility-administered medications for this visit.    Review of Systems  Constitutional: positive for fatigue and weight loss Eyes: negative Ears, nose,  mouth, throat, and face: negative Respiratory: positive for dyspnea on exertion Cardiovascular: negative Gastrointestinal: negative Genitourinary:negative Integument/breast: negative Hematologic/lymphatic: negative Musculoskeletal:negative Neurological: negative Behavioral/Psych: negative Endocrine: negative Allergic/Immunologic: negative  Physical Exam  EAV:WUJWJ, healthy, no distress, well nourished, well developed and anxious SKIN: skin color, texture, turgor are normal, no rashes or significant lesions HEAD: Normocephalic, No masses, lesions, tenderness or abnormalities EYES: normal, PERRLA EARS: External ears normal, Canals clear OROPHARYNX:no exudate, no erythema and lips, buccal mucosa, and tongue normal  NECK: supple, no adenopathy, no JVD LYMPH:  no palpable lymphadenopathy, no hepatosplenomegaly LUNGS: clear to auscultation , and palpation HEART: regular rate & rhythm, no murmurs and no gallops ABDOMEN:abdomen soft, non-tender, normal bowel sounds and no masses or organomegaly BACK: Back symmetric, no curvature., No CVA tenderness  EXTREMITIES:no joint deformities, effusion, or inflammation, no edema, no skin discoloration  NEURO: alert & oriented x 3 with fluent speech, no focal motor/sensory deficits  PERFORMANCE STATUS: ECOG 1  LABORATORY DATA: Lab Results  Component Value Date   WBC 7.5 02/10/2008   HGB 13.9 09/12/2011   HCT 41.0 09/12/2011   MCV 97.0 02/10/2008   PLT 340 02/10/2008      Chemistry      Component Value Date/Time   NA 125* 09/12/2011 1302   K 5.5* 09/12/2011 1302   CL 95* 09/12/2011 1302   CO2 26 12/25/2008 1425   BUN 19 09/12/2011 1302   CREATININE 1.00 09/12/2011 1302      Component Value Date/Time   CALCIUM 9.1 12/25/2008 1425       RADIOGRAPHIC STUDIES: No results found.  ASSESSMENT: This is a very pleasant 63 years old white male recently diagnosed with limited stage small cell lung cancer presented with left lower lobe  hilar mass with no other evidence of metastatic disease pending further staging workup.   PLAN: I had a lengthy discussion with the patient and his wife today about his disease is stage, prognosis and treatment options. I recommended for the patient to complete the staging workup by ordering a PET scan as well as MRI of the brain to rule out any metastatic disease. I discussed with the patient his treatment options including systemic chemotherapy with cisplatin 60 MG/M2 on day 1 and etoposide 120 MG/M2 on days 1, 2 and 3 with Neulasta support on day 4. This will be concurrent with radiotherapy if there is no evidence for metastatic disease. I discussed with the patient the adverse effect of the chemotherapy including but not limited to alopecia, myelosuppression, nausea and vomiting, peripheral neuropathy, liver or renal dysfunction. I expect the patient to start the first cycle of this treatment on 09/14/2014 after completion of the staging workup. He would have a chemotherapy education class before starting the first dose of his treatment. I will call his pharmacy with prescription for Compazine 10 mg by mouth every 6 hours as needed for nausea. He would come back for follow-up visit in 3 weeks for reevaluation and management of any adverse effect of his treatment. The patient and his wife agreed to the current plan. For smoke cessation I strongly encouraged the patient to quit smoking and offered him to smoke cessation program. I also recommended for the patient to discontinue his current treatment with methotrexate during the course of the systemic chemotherapy with cisplatin and etoposide. The patient was advised to call immediately if he has any concerning symptoms in the interval. The patient voices understanding of current disease status and treatment options and is in agreement with the current care plan.  All questions were answered. The patient knows to call the clinic with any problems,  questions or concerns. We can certainly see the patient much sooner if necessary.  Thank you so much for allowing me to participate in the care of KEM HENSEN. I will continue to follow up the patient with you and assist in his care.  I spent 55 minutes counseling the patient face to face. The total time spent in the appointment was 80 minutes.  Disclaimer: This note was dictated with voice recognition software. Similar sounding words can inadvertently be transcribed and may not be corrected upon review.   Noris Kulinski K. 09/03/2014, 12:53 PM

## 2014-09-07 ENCOUNTER — Other Ambulatory Visit: Payer: Self-pay | Admitting: Medical Oncology

## 2014-09-07 ENCOUNTER — Telehealth: Payer: Self-pay | Admitting: Internal Medicine

## 2014-09-07 NOTE — Telephone Encounter (Signed)
, °

## 2014-09-08 ENCOUNTER — Other Ambulatory Visit: Payer: Self-pay | Admitting: Internal Medicine

## 2014-09-08 ENCOUNTER — Other Ambulatory Visit: Payer: Managed Care, Other (non HMO)

## 2014-09-08 ENCOUNTER — Encounter: Payer: Self-pay | Admitting: *Deleted

## 2014-09-08 DIAGNOSIS — C3490 Malignant neoplasm of unspecified part of unspecified bronchus or lung: Secondary | ICD-10-CM

## 2014-09-09 ENCOUNTER — Telehealth: Payer: Self-pay | Admitting: *Deleted

## 2014-09-09 NOTE — Telephone Encounter (Signed)
Pt called stating that he would prefer to not have a port placed at this time.  He wants to at least get the 1st cycle of chemo done first and then will decide whether he wants one or not.  Called and informed IR, order for port placement removed from [facility].

## 2014-09-10 ENCOUNTER — Ambulatory Visit
Admission: RE | Admit: 2014-09-10 | Discharge: 2014-09-10 | Disposition: A | Discharge status: Home / Self Care | Payer: Managed Care, Other (non HMO) | Source: Ambulatory Visit | Attending: Internal Medicine | Admitting: Internal Medicine

## 2014-09-10 DIAGNOSIS — C3492 Malignant neoplasm of unspecified part of left bronchus or lung: Secondary | ICD-10-CM

## 2014-09-10 MED ORDER — GADOBENATE DIMEGLUMINE 529 MG/ML IV SOLN
14.0000 mL | Freq: Once | INTRAVENOUS | Status: AC | PRN
  Administered 2014-09-10: 14 mL via INTRAVENOUS

## 2014-09-11 ENCOUNTER — Ambulatory Visit (HOSPITAL_COMMUNITY)
Admission: RE | Admit: 2014-09-11 | Discharge: 2014-09-11 | Disposition: A | Discharge status: Home / Self Care | Payer: Managed Care, Other (non HMO) | Source: Ambulatory Visit | Attending: Internal Medicine | Admitting: Internal Medicine

## 2014-09-11 ENCOUNTER — Other Ambulatory Visit: Payer: Self-pay | Admitting: Medical Oncology

## 2014-09-11 ENCOUNTER — Encounter (HOSPITAL_COMMUNITY): Payer: Self-pay

## 2014-09-11 DIAGNOSIS — R599 Enlarged lymph nodes, unspecified: Secondary | ICD-10-CM | POA: Diagnosis not present

## 2014-09-11 DIAGNOSIS — C3492 Malignant neoplasm of unspecified part of left bronchus or lung: Secondary | ICD-10-CM | POA: Diagnosis present

## 2014-09-11 DIAGNOSIS — C3490 Malignant neoplasm of unspecified part of unspecified bronchus or lung: Secondary | ICD-10-CM

## 2014-09-11 LAB — GLUCOSE, CAPILLARY: Glucose-Capillary: 107 mg/dL — ABNORMAL HIGH (ref 70–99)

## 2014-09-11 MED ORDER — FLUDEOXYGLUCOSE F - 18 (FDG) INJECTION
7.2000 | Freq: Once | INTRAVENOUS | Status: AC | PRN
  Administered 2014-09-11: 7.2 via INTRAVENOUS

## 2014-09-14 ENCOUNTER — Ambulatory Visit: Payer: Managed Care, Other (non HMO) | Admitting: Nutrition

## 2014-09-14 ENCOUNTER — Other Ambulatory Visit (HOSPITAL_BASED_OUTPATIENT_CLINIC_OR_DEPARTMENT_OTHER): Payer: Managed Care, Other (non HMO)

## 2014-09-14 ENCOUNTER — Ambulatory Visit (HOSPITAL_BASED_OUTPATIENT_CLINIC_OR_DEPARTMENT_OTHER): Payer: Managed Care, Other (non HMO)

## 2014-09-14 DIAGNOSIS — Z5111 Encounter for antineoplastic chemotherapy: Secondary | ICD-10-CM

## 2014-09-14 DIAGNOSIS — C7A1 Malignant poorly differentiated neuroendocrine tumors: Secondary | ICD-10-CM

## 2014-09-14 DIAGNOSIS — C3490 Malignant neoplasm of unspecified part of unspecified bronchus or lung: Secondary | ICD-10-CM

## 2014-09-14 DIAGNOSIS — C3492 Malignant neoplasm of unspecified part of left bronchus or lung: Secondary | ICD-10-CM

## 2014-09-14 LAB — COMPREHENSIVE METABOLIC PANEL (CC13)
ALT: 37 U/L (ref 0–55)
ANION GAP: 10 meq/L (ref 3–11)
AST: 21 U/L (ref 5–34)
Albumin: 4.2 g/dL (ref 3.5–5.0)
Alkaline Phosphatase: 56 U/L (ref 40–150)
BILIRUBIN TOTAL: 1.03 mg/dL (ref 0.20–1.20)
BUN: 10.7 mg/dL (ref 7.0–26.0)
CALCIUM: 9.2 mg/dL (ref 8.4–10.4)
CHLORIDE: 94 meq/L — AB (ref 98–109)
CO2: 25 mEq/L (ref 22–29)
Creatinine: 0.7 mg/dL (ref 0.7–1.3)
Glucose: 109 mg/dl (ref 70–140)
Potassium: 4.5 mEq/L (ref 3.5–5.1)
Sodium: 128 mEq/L — ABNORMAL LOW (ref 136–145)
Total Protein: 6.8 g/dL (ref 6.4–8.3)

## 2014-09-14 LAB — CBC WITH DIFFERENTIAL/PLATELET
BASO%: 0.6 % (ref 0.0–2.0)
BASOS ABS: 0.1 10*3/uL (ref 0.0–0.1)
EOS%: 0.1 % (ref 0.0–7.0)
Eosinophils Absolute: 0 10*3/uL (ref 0.0–0.5)
HEMATOCRIT: 45.3 % (ref 38.4–49.9)
HEMOGLOBIN: 15.1 g/dL (ref 13.0–17.1)
LYMPH#: 1.1 10*3/uL (ref 0.9–3.3)
LYMPH%: 11.1 % — AB (ref 14.0–49.0)
MCH: 33.6 pg — ABNORMAL HIGH (ref 27.2–33.4)
MCHC: 33.3 g/dL (ref 32.0–36.0)
MCV: 101 fL — AB (ref 79.3–98.0)
MONO#: 0.5 10*3/uL (ref 0.1–0.9)
MONO%: 5.1 % (ref 0.0–14.0)
NEUT#: 8 10*3/uL — ABNORMAL HIGH (ref 1.5–6.5)
NEUT%: 83.1 % — AB (ref 39.0–75.0)
PLATELETS: 336 10*3/uL (ref 140–400)
RBC: 4.48 10*6/uL (ref 4.20–5.82)
RDW: 13.4 % (ref 11.0–14.6)
WBC: 9.6 10*3/uL (ref 4.0–10.3)

## 2014-09-14 MED ORDER — DEXAMETHASONE SODIUM PHOSPHATE 20 MG/5ML IJ SOLN
INTRAMUSCULAR | Status: AC
  Filled 2014-09-14: qty 5

## 2014-09-14 MED ORDER — PROCHLORPERAZINE MALEATE 10 MG PO TABS: 10.0000 mg | ORAL_TABLET | Freq: Four times a day (QID) | ORAL | Status: DC | PRN

## 2014-09-14 MED ORDER — SODIUM CHLORIDE 0.9 % IV SOLN
150.0000 mg | Freq: Once | INTRAVENOUS | Status: AC
  Administered 2014-09-14: 150 mg via INTRAVENOUS
  Filled 2014-09-14: qty 5

## 2014-09-14 MED ORDER — SODIUM CHLORIDE 0.9 % IV SOLN
120.0000 mg/m2 | Freq: Once | INTRAVENOUS | Status: AC
  Administered 2014-09-14: 210 mg via INTRAVENOUS
  Filled 2014-09-14: qty 10.5

## 2014-09-14 MED ORDER — PALONOSETRON HCL INJECTION 0.25 MG/5ML
0.2500 mg | Freq: Once | INTRAVENOUS | Status: AC
  Administered 2014-09-14: 0.25 mg via INTRAVENOUS

## 2014-09-14 MED ORDER — PALONOSETRON HCL INJECTION 0.25 MG/5ML
INTRAVENOUS | Status: AC
  Filled 2014-09-14: qty 5

## 2014-09-14 MED ORDER — ONDANSETRON HCL 8 MG PO TABS: 8.0000 mg | ORAL_TABLET | Freq: Three times a day (TID) | ORAL | Status: DC | PRN

## 2014-09-14 MED ORDER — SODIUM CHLORIDE 0.9 % IV SOLN
60.0000 mg/m2 | Freq: Once | INTRAVENOUS | Status: AC
  Administered 2014-09-14: 107 mg via INTRAVENOUS
  Filled 2014-09-14: qty 107

## 2014-09-14 MED ORDER — DEXAMETHASONE SODIUM PHOSPHATE 20 MG/5ML IJ SOLN
12.0000 mg | Freq: Once | INTRAMUSCULAR | Status: AC
  Administered 2014-09-14: 12 mg via INTRAVENOUS

## 2014-09-14 MED ORDER — POTASSIUM CHLORIDE 2 MEQ/ML IV SOLN
Freq: Once | INTRAVENOUS | Status: AC
  Administered 2014-09-14: 10:00:00 via INTRAVENOUS
  Filled 2014-09-14: qty 10

## 2014-09-14 MED ORDER — SODIUM CHLORIDE 0.9 % IV SOLN
Freq: Once | INTRAVENOUS | Status: AC
  Administered 2014-09-14: 12:00:00 via INTRAVENOUS

## 2014-09-14 NOTE — Progress Notes (Signed)
63 year old male diagnosed with lung cancer.  He is a patient of Dr. Earlie Server.  Past medical history includes hypertension, GERD, and tobacco.  Patient reports he has irritable bowel syndrome.  Medications include Wellbutrin, Folvite, glucosamine/chondroitin, multivitamin, and Prilosec.  Labs include sodium 128 on November 16.  Height: 68 inches. Weight: 145.9 pounds November 5. Usual body weight: 150-155 pounds per patient. BMI: 22.19.  Patient reports appetite increased while on steroids.   Patient reports that any food or liquid consumed first thing in the morning causes diarrhea.  Patient denies diarrhea the rest of the day.  States this is his normal pattern the past 5 years. He is trying to drink ensure every morning.   Wife has many questions regarding nutrition. States she heard RN say too much sugar can cause cancer.  Nutrition diagnosis: Food and nutrition related knowledge deficit related to new diagnosis of lung cancer and associated treatments as evidenced by no prior need for nutrition related information.  Intervention:  Patient educated to consume small, frequent meals and snacks throughout the day consuming high-calorie, high-protein foods. Recommended oral nutrition supplements as needed to minimize weight loss. Educated patient about the availability of the juice-based nutrition supplement and provided samples. Also provided coupons. Educated patient's wife regarding "sugar and cancer". Provided detailed fact sheet. Questions were answered.  Teach back method used.  Contact information was given.  Monitoring, evaluation, goals: Patient will tolerate adequate calories and protein to minimize weight loss throughout treatment.  Next visit: Monday, December 7, during chemotherapy.  **Disclaimer: This note was dictated with voice recognition software. Similar sounding words can inadvertently be transcribed and this note may contain transcription errors which may not have  been corrected upon publication of note.**

## 2014-09-14 NOTE — Progress Notes (Signed)
Pre CDDP output = 525 cc and post CDDP output = 1850 cc for total output today 2375 cc.

## 2014-09-14 NOTE — Patient Instructions (Signed)
[facility] Discharge Instructions for Patients Receiving Chemotherapy  Today you received the following chemotherapy agents: Cisplatin & Etoposide  To help prevent nausea and vomiting after your treatment, we encourage you to take your nausea medications:  Compazine 10 mg every 6 hours as needed  Zofran 8 mg twice daily as needed (start on 3rd day after chemo due to aloxi premed) If you develop nausea and vomiting that is not controlled by your nausea medication, call the clinic.   BELOW ARE SYMPTOMS THAT SHOULD BE REPORTED IMMEDIATELY:  *FEVER GREATER THAN 100.5 F  *CHILLS WITH OR WITHOUT FEVER  NAUSEA AND VOMITING THAT IS NOT CONTROLLED WITH YOUR NAUSEA MEDICATION  *UNUSUAL SHORTNESS OF BREATH  *UNUSUAL BRUISING OR BLEEDING  TENDERNESS IN MOUTH AND THROAT WITH OR WITHOUT PRESENCE OF ULCERS  *URINARY PROBLEMS  *BOWEL PROBLEMS  UNUSUAL RASH Items with * indicate a potential emergency and should be followed up as soon as possible.  Feel free to call the clinic should you have any questions or concerns. The clinic phone number is [phone].  It has been a pleasure to serve you today!!

## 2014-09-15 ENCOUNTER — Ambulatory Visit (HOSPITAL_BASED_OUTPATIENT_CLINIC_OR_DEPARTMENT_OTHER): Payer: Managed Care, Other (non HMO)

## 2014-09-15 ENCOUNTER — Telehealth: Payer: Self-pay | Admitting: Medical Oncology

## 2014-09-15 DIAGNOSIS — Z5111 Encounter for antineoplastic chemotherapy: Secondary | ICD-10-CM

## 2014-09-15 DIAGNOSIS — C7A1 Malignant poorly differentiated neuroendocrine tumors: Secondary | ICD-10-CM

## 2014-09-15 DIAGNOSIS — C3492 Malignant neoplasm of unspecified part of left bronchus or lung: Secondary | ICD-10-CM

## 2014-09-15 MED ORDER — SODIUM CHLORIDE 0.9 % IV SOLN
Freq: Once | INTRAVENOUS | Status: AC
  Administered 2014-09-15: 10:00:00 via INTRAVENOUS

## 2014-09-15 MED ORDER — SODIUM CHLORIDE 0.9 % IV SOLN
120.0000 mg/m2 | Freq: Once | INTRAVENOUS | Status: AC
  Administered 2014-09-15: 210 mg via INTRAVENOUS
  Filled 2014-09-15: qty 10.5

## 2014-09-15 MED ORDER — DEXAMETHASONE SODIUM PHOSPHATE 10 MG/ML IJ SOLN
INTRAMUSCULAR | Status: AC
  Filled 2014-09-15: qty 1

## 2014-09-15 MED ORDER — DEXAMETHASONE SODIUM PHOSPHATE 10 MG/ML IJ SOLN
10.0000 mg | Freq: Once | INTRAMUSCULAR | Status: AC
  Administered 2014-09-15: 10 mg via INTRAVENOUS

## 2014-09-15 NOTE — Telephone Encounter (Signed)
Pt notified of result and I told pt Spencer Long did not prescribe salt tablets.

## 2014-09-15 NOTE — Telephone Encounter (Signed)
-----   Message from Curt Bears, MD sent at 09/14/2014  5:24 PM EST ----- MRI is OK. PET scan showed previous known lesions. I did not RX salt tablet. ----- Message -----    From: Ardeen Garland, RN    Sent: 09/14/2014   4:45 PM      To: Curt Bears, MD  Asking for PET/MRI results and dosing for salt tablet

## 2014-09-15 NOTE — Patient Instructions (Signed)
[facility] Discharge Instructions for Patients Receiving Chemotherapy  Today you received the following chemotherapy agents: Etoposide.  To help prevent nausea and vomiting after your treatment, we encourage you to take your nausea medication as prescribed.   If you develop nausea and vomiting that is not controlled by your nausea medication, call the clinic.   BELOW ARE SYMPTOMS THAT SHOULD BE REPORTED IMMEDIATELY:  *FEVER GREATER THAN 100.5 F  *CHILLS WITH OR WITHOUT FEVER  NAUSEA AND VOMITING THAT IS NOT CONTROLLED WITH YOUR NAUSEA MEDICATION  *UNUSUAL SHORTNESS OF BREATH  *UNUSUAL BRUISING OR BLEEDING  TENDERNESS IN MOUTH AND THROAT WITH OR WITHOUT PRESENCE OF ULCERS  *URINARY PROBLEMS  *BOWEL PROBLEMS  UNUSUAL RASH Items with * indicate a potential emergency and should be followed up as soon as possible.  Feel free to call the clinic you have any questions or concerns. The clinic phone number is [phone].

## 2014-09-16 ENCOUNTER — Ambulatory Visit (HOSPITAL_BASED_OUTPATIENT_CLINIC_OR_DEPARTMENT_OTHER): Payer: Managed Care, Other (non HMO)

## 2014-09-16 DIAGNOSIS — C7A1 Malignant poorly differentiated neuroendocrine tumors: Secondary | ICD-10-CM

## 2014-09-16 DIAGNOSIS — C3492 Malignant neoplasm of unspecified part of left bronchus or lung: Secondary | ICD-10-CM

## 2014-09-16 DIAGNOSIS — Z5111 Encounter for antineoplastic chemotherapy: Secondary | ICD-10-CM

## 2014-09-16 MED ORDER — DEXAMETHASONE SODIUM PHOSPHATE 10 MG/ML IJ SOLN
INTRAMUSCULAR | Status: AC
  Filled 2014-09-16: qty 1

## 2014-09-16 MED ORDER — DEXAMETHASONE SODIUM PHOSPHATE 10 MG/ML IJ SOLN
10.0000 mg | Freq: Once | INTRAMUSCULAR | Status: AC
  Administered 2014-09-16: 10 mg via INTRAVENOUS

## 2014-09-16 MED ORDER — SODIUM CHLORIDE 0.9 % IV SOLN
Freq: Once | INTRAVENOUS | Status: AC
  Administered 2014-09-16: 09:00:00 via INTRAVENOUS

## 2014-09-16 MED ORDER — SODIUM CHLORIDE 0.9 % IV SOLN
120.0000 mg/m2 | Freq: Once | INTRAVENOUS | Status: AC
  Administered 2014-09-16: 210 mg via INTRAVENOUS
  Filled 2014-09-16: qty 10.5

## 2014-09-16 NOTE — Patient Instructions (Signed)
[facility] Discharge Instructions for Patients Receiving Chemotherapy  Today you received the following chemotherapy agents: etoposide  To help prevent nausea and vomiting after your treatment, we encourage you to take your nausea medication.  Take it as often as prescribed.     If you develop nausea and vomiting that is not controlled by your nausea medication, call the clinic. If it is after clinic hours your family physician or the after hours number for the clinic or go to the Emergency Department.   BELOW ARE SYMPTOMS THAT SHOULD BE REPORTED IMMEDIATELY:  *FEVER GREATER THAN 100.5 F  *CHILLS WITH OR WITHOUT FEVER  NAUSEA AND VOMITING THAT IS NOT CONTROLLED WITH YOUR NAUSEA MEDICATION  *UNUSUAL SHORTNESS OF BREATH  *UNUSUAL BRUISING OR BLEEDING  TENDERNESS IN MOUTH AND THROAT WITH OR WITHOUT PRESENCE OF ULCERS  *URINARY PROBLEMS  *BOWEL PROBLEMS  UNUSUAL RASH Items with * indicate a potential emergency and should be followed up as soon as possible.  Feel free to call the clinic you have any questions or concerns. The clinic phone number is [phone].   I have been informed and understand all the instructions given to me. I know to contact the clinic, my physician, or go to the Emergency Department if any problems should occur. I do not have any questions at this time, but understand that I may call the clinic during office hours   should I have any questions or need assistance in obtaining follow up care.    __________________________________________  _____________  __________ Signature of Patient or Authorized Representative            Date                   Time    __________________________________________ Nurse's Signature

## 2014-09-16 NOTE — Progress Notes (Signed)
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 [name] with "persistent abdominal pain and was seen by his primary care physician Dr. Maury Dus. He had CT scan of the abdomen which was performed on 07/30/2014 and it showed infrahilar fullness on the left side, suspicious for endobronchial tumor or mucoid impaction. This was followed by CT scan of the chest performed at [facility] on 08/07/2014 and showed left lower lobe bronchial obstruction versus mass. The patient was referred to Dr. Lamonte Sakai  Biopsies revealed:   08/24/14 Diagnosis Endobronchial biopsy, left lower lobe - POORLY DIFFERENTIATED SMALL CELL NEUROENDOCRINE CARCINOMA.  Diagnosis BRONCHIAL BRUSHING #2, LLL ENDOBRONCHIAL BRUSHING (SPECIMEN 2 2 OF 2 , COLLECTED ON 08/24/2014) RARE MALIGNANT CELLS CONSISTENT WITH SMALL CELL CARCINOMA.  Diagnosis FINE NEEDLE ASPIRATION: NEEDLE ASPIRATION, EBUS #3, LLL (SPECIMEN 1 OF 2, COLLECTED ON 08/24/14): MALIGNANT CELLS CONSISTENT WITH SMALL CELL CARCINOMA.  Tobacco/Marijuana/Snuff/ETOH use: current smoker - uses electronic cigarettes only and a Nicoderm patch - has smoked 1.5 ppd for 40 years.  Drinks per 2 drinks per day.  Past/Anticipated interventions by cardiothoracic surgery, if any: 08/24/14 - Procedure: ENDOBRONCHIAL ULTRASOUND;  Surgeon: Collene Gobble, MD;  Location: [facility];  Service: Cardiopulmonary;  Laterality: Bilateral;  Past/Anticipated interventions by medical oncology, if any: systemic chemotherapy with cisplatin 60 MG/M2 on day 1 and etoposide 120 MG/M2 on days 1, 2 and 3 with Neulasta support on day 4.  First cycle on 09/14/14.  Signs/Symptoms  Weight changes, if any: no  Respiratory complaints, if any: reports occasional dry cough.  Hemoptysis, if any: no  Pain issues, if any:  Has pain in his abdomen from bloating.  Rated at a 4/10 today.  SAFETY ISSUES:  Prior radiation? no  Pacemaker/ICD?  no   Possible current pregnancy?no  Is the patient on methotrexate? No - took last dose 08/28/14 for his skin.  Current Complaints / other details:  Patient is here with his wife.  He has noticed having hiccups (3-4 in a row) that started yesterday.  He is a retired Insurance underwriter.

## 2014-09-17 ENCOUNTER — Telehealth: Payer: Self-pay

## 2014-09-17 ENCOUNTER — Ambulatory Visit
Admission: RE | Admit: 2014-09-17 | Discharge: 2014-09-17 | Disposition: A | Discharge status: Home / Self Care | Payer: Managed Care, Other (non HMO) | Source: Ambulatory Visit | Attending: Radiation Oncology | Admitting: Radiation Oncology

## 2014-09-17 ENCOUNTER — Ambulatory Visit: Payer: Managed Care, Other (non HMO)

## 2014-09-17 ENCOUNTER — Ambulatory Visit: Payer: Managed Care, Other (non HMO) | Admitting: Emergency Medicine

## 2014-09-17 ENCOUNTER — Encounter: Payer: Self-pay | Admitting: Radiation Oncology

## 2014-09-17 ENCOUNTER — Ambulatory Visit (HOSPITAL_BASED_OUTPATIENT_CLINIC_OR_DEPARTMENT_OTHER): Payer: Managed Care, Other (non HMO)

## 2014-09-17 VITALS — BP 151/70 | HR 70 | Temp 98.9°F | Resp 16 | Ht 68.0 in | Wt 155.9 lb

## 2014-09-17 DIAGNOSIS — C3490 Malignant neoplasm of unspecified part of unspecified bronchus or lung: Secondary | ICD-10-CM | POA: Diagnosis not present

## 2014-09-17 DIAGNOSIS — Z510 Encounter for antineoplastic radiation therapy: Secondary | ICD-10-CM | POA: Insufficient documentation

## 2014-09-17 DIAGNOSIS — C3492 Malignant neoplasm of unspecified part of left bronchus or lung: Secondary | ICD-10-CM

## 2014-09-17 DIAGNOSIS — Z5189 Encounter for other specified aftercare: Secondary | ICD-10-CM

## 2014-09-17 DIAGNOSIS — C7A1 Malignant poorly differentiated neuroendocrine tumors: Secondary | ICD-10-CM

## 2014-09-17 DIAGNOSIS — C3432 Malignant neoplasm of lower lobe, left bronchus or lung: Secondary | ICD-10-CM

## 2014-09-17 HISTORY — DX: Malignant neoplasm of unspecified part of unspecified bronchus or lung: C34.90

## 2014-09-17 MED ORDER — PEGFILGRASTIM INJECTION 6 MG/0.6ML ~~LOC~~
6.0000 mg | PREFILLED_SYRINGE | Freq: Once | SUBCUTANEOUS | Status: AC
Start: 2014-09-17 — End: 2014-09-17
  Administered 2014-09-17: 6 mg via SUBCUTANEOUS
  Filled 2014-09-17: qty 0.6

## 2014-09-17 NOTE — Patient Instructions (Signed)
Pegfilgrastim injection What is this medicine? PEGFILGRASTIM (peg fil GRA stim) is a long-acting granulocyte colony-stimulating factor that stimulates the growth of neutrophils, a type of white blood cell important in the body's fight against infection. It is used to reduce the incidence of fever and infection in patients with certain types of cancer who are receiving chemotherapy that affects the bone marrow. This medicine may be used for other purposes; ask your health care provider or pharmacist if you have questions. COMMON BRAND NAME(S): Neulasta What should I tell my health care provider before I take this medicine? They need to know if you have any of these conditions: -latex allergy -ongoing radiation therapy -sickle cell disease -skin reactions to acrylic adhesives (On-Body Injector only) -an unusual or allergic reaction to pegfilgrastim, filgrastim, other medicines, foods, dyes, or preservatives -pregnant or trying to get pregnant -breast-feeding How should I use this medicine? This medicine is for injection under the skin. If you get this medicine at home, you will be taught how to prepare and give the pre-filled syringe or how to use the On-body Injector. Refer to the patient Instructions for Use for detailed instructions. Use exactly as directed. Take your medicine at regular intervals. Do not take your medicine more often than directed. It is important that you put your used needles and syringes in a special sharps container. Do not put them in a trash can. If you do not have a sharps container, call your pharmacist or healthcare provider to get one. Talk to your pediatrician regarding the use of this medicine in children. Special care may be needed. Overdosage: If you think you have taken too much of this medicine contact a poison control center or emergency room at once. NOTE: This medicine is only for you. Do not share this medicine with others. What if I miss a dose? It is  important not to miss your dose. Call your doctor or health care professional if you miss your dose. If you miss a dose due to an On-body Injector failure or leakage, a new dose should be administered as soon as possible using a single prefilled syringe for manual use. What may interact with this medicine? Interactions have not been studied. Give your health care provider a list of all the medicines, herbs, non-prescription drugs, or dietary supplements you use. Also tell them if you smoke, drink alcohol, or use illegal drugs. Some items may interact with your medicine. This list may not describe all possible interactions. Give your health care provider a list of all the medicines, herbs, non-prescription drugs, or dietary supplements you use. Also tell them if you smoke, drink alcohol, or use illegal drugs. Some items may interact with your medicine. What should I watch for while using this medicine? You may need blood work done while you are taking this medicine. If you are going to need a MRI, CT scan, or other procedure, tell your doctor that you are using this medicine (On-Body Injector only). What side effects may I notice from receiving this medicine? Side effects that you should report to your doctor or health care professional as soon as possible: -allergic reactions like skin rash, itching or hives, swelling of the face, lips, or tongue -dizziness -fever -pain, redness, or irritation at site where injected -pinpoint red spots on the skin -shortness of breath or breathing problems -stomach or side pain, or pain at the shoulder -swelling -tiredness -trouble passing urine Side effects that usually do not require medical attention (report to your doctor   or health care professional if they continue or are bothersome): -bone pain -muscle pain This list may not describe all possible side effects. Call your doctor for medical advice about side effects. You may report side effects to FDA at  1-800-FDA-1088. Where should I keep my medicine? Keep out of the reach of children. Store pre-filled syringes in a refrigerator between 2 and 8 degrees C (36 and 46 degrees F). Do not freeze. Keep in carton to protect from light. Throw away this medicine if it is left out of the refrigerator for more than 48 hours. Throw away any unused medicine after the expiration date. NOTE: This sheet is a summary. It may not cover all possible information. If you have questions about this medicine, talk to your doctor, pharmacist, or health care provider.  2015, [employer]vier/Gold Standard. (2014-01-15 16:14:05)  

## 2014-09-17 NOTE — Progress Notes (Signed)
Pt stated he has been constipated and hasn't had a bowel movement in three days  Pt and wife given verbal and written instructions to get mirilax or sennakot-s and start today.

## 2014-09-17 NOTE — Progress Notes (Signed)
Please see the Nurse Progress Note in the MD Initial Consult Encounter for this patient. 

## 2014-09-17 NOTE — Telephone Encounter (Signed)
S/w pt. He denies any other complications with chemo therapy except the constipation discussed earlier w/Sarah Rich Reining RN.

## 2014-09-17 NOTE — Telephone Encounter (Signed)
-----   Message from Tania Ade, RN sent at 09/14/2014  6:01 PM EST ----- Regarding: Chemo Follow Up Call Dr. [name]- 1st CDDP and Etoposide Call back due on 09/09/18

## 2014-09-17 NOTE — Progress Notes (Signed)
Radiation Oncology         [phone] ________________________________  Initial outpatient Consultation  Name: Spencer Long MRN: [id number]  Date: 09/17/2014  DOB: February 08, 1951  VZ:DGLOV,FIEPPI ALEXANDER, MD  Maury Dus, MD   REFERRING PHYSICIAN: Maury Dus, MD  DIAGNOSIS: The encounter diagnosis was Small cell lung cancer, left.  HISTORY OF PRESENT ILLNESS::Spencer Long is a 63 y.o. male who is seen out courtesy of Dr. [name] for an opinion concerning radiation therapy as part of management of patient's recently diagnosed small cell lung cancer. Patient presented earlier this year with abdominal pain. He proceeded to undergo CT scan of abdomen and was noted to have a possible lesion in the left lower lung area. A chest CT scan confirmed a mass in the left infrahilar region. Patient proceeded to undergo bronchoscopy and biopsy revealed small cell undifferentiated carcinoma. The patient has been started on chemotherapy and is tolerating this well thus far. The patient has recently completed his staging workup with an MRI the brain and PET scan in his care today for review of these studies and for radiation therapy consultation.[name]  PREVIOUS RADIATION THERAPY: No  PAST MEDICAL HISTORY:  has a past medical history of Hypertension; GERD (gastroesophageal reflux disease); Infection of elbow; Sarcoidosis; Chronic hyponatremia; and Lung cancer.    PAST SURGICAL HISTORY: Past Surgical History  Procedure Laterality Date  . Cholecystectomy  05/2006  . Ulnar nerve repair  01/2008    right; decompression  . Carpal tunnel release  01/2008    right  . Eye surgery  12/2008    correction ectropion, release band, wedge exc., shortening  . I&d extremity  09/12/2011    Procedure: IRRIGATION AND DEBRIDEMENT EXTREMITY;  Surgeon: Tennis Must;  Location: [facility];  Service: Orthopedics;  Laterality: Right;  I&d RIGHT OLECRANON BURSSA   . Tonsillectomy    . Endobronchial  ultrasound Bilateral 08/24/2014    Procedure: ENDOBRONCHIAL ULTRASOUND;  Surgeon: Collene Gobble, MD;  Location: [facility];  Service: Cardiopulmonary;  Laterality: Bilateral;    FAMILY HISTORY: family history includes Cancer in his father and mother; Dementia in his mother.  SOCIAL HISTORY:  reports that he has been smoking Cigarettes.  He has a 60 pack-year smoking history. He has never used smokeless tobacco. He reports that he drinks about 8.4 oz of alcohol per week. He reports that he does not use illicit drugs.  ALLERGIES: Varenicline; Isothiazolinone chloride; and Quaternium-15  MEDICATIONS:  Current Outpatient Prescriptions  Medication Sig Dispense Refill  . amLODipine (NORVASC) 10 MG tablet Take 10 mg by mouth every morning.     [name] atenolol (TENORMIN) 25 MG tablet Take 25 mg by mouth 2 (two) times daily.      . Azilsartan Medoxomil (EDARBI) 80 MG TABS Take 1 tablet by mouth every morning.    [name] buPROPion (WELLBUTRIN SR) 150 MG 12 hr tablet Take 150mg  daily for one week, then take 150mg  by mouth twice a day 60 tablet 2  . clobetasol cream (TEMOVATE) 9.51 % Apply 1 application topically 2 (two) times daily as needed.    . folic acid (FOLVITE) 1 MG tablet Take 1 mg by mouth 2 (two) times daily.    [name] glucosamine-chondroitin 500-400 MG tablet Take 1 tablet by mouth 2 (two) times daily.    . Multiple Vitamins-Minerals (MULTIVITAMIN ADULTS 50+ PO) Take 1 tablet by mouth every morning.     . nicotine (NICODERM CQ - DOSED IN MG/24 HOURS) 21 mg/24hr patch Place  1 patch (21 mg total) onto the skin daily. 28 patch 0  . omeprazole (PRILOSEC) 40 MG capsule Take 40 mg by mouth every morning.    . Probiotic Product (PROBIOTIC DAILY PO) Take 1 tablet by mouth every morning.     . prochlorperazine (COMPAZINE) 10 MG tablet Take 1 tablet (10 mg total) by mouth every 6 (six) hours as needed for nausea. 60 tablet 1  . sodium chloride 1 G tablet Take 1 g by mouth 3 (three) times daily.    . white  petrolatum (VASELINE) GEL Apply 1 application topically daily as needed for lip care or dry skin.    [name] acetaminophen (TYLENOL) 500 MG tablet Take 1,000 mg by mouth 3 (three) times daily.     [name] aspirin EC 81 MG tablet Take 81 mg by mouth every morning.     . diclofenac (VOLTAREN) 75 MG EC tablet Take 75 mg by mouth every 12 (twelve) hours.     [name] dicyclomine (BENTYL) 20 MG tablet Take 20 mg by mouth every 4 (four) hours as needed for spasms.     . ondansetron (ZOFRAN) 8 MG tablet Take 1 tablet (8 mg total) by mouth every 8 (eight) hours as needed for nausea or vomiting (start on 3rd day after chemo). 20 tablet 1   No current facility-administered medications for this encounter.    REVIEW OF SYSTEMS:  A 15 point review of systems is documented in the electronic medical record. This was obtained by the nursing staff. However, I reviewed this with the patient to discuss relevant findings and make appropriate changes.  No headaches double vision or blurred vision. Patient denies any cough or breathing problems pain within the chest area or hemoptysis. He continues have the intermittent abdominal pain which she seems to think is related to food ingestion.   PHYSICAL EXAM:  height is 5\' 8"  (1.727 m) and weight is 155 lb 14.4 oz (70.716 kg). His oral temperature is 98.9 F (37.2 C). His blood pressure is 151/70 and his pulse is 70. His respiration is 16 and oxygen saturation is 99%.   BP 151/70 mmHg  Pulse 70  Temp(Src) 98.9 F (37.2 C) (Oral)  Resp 16  Ht 5\' 8"  (1.727 m)  Wt 155 lb 14.4 oz (70.716 kg)  BMI 23.71 kg/m2  SpO2 99%  General Appearance:    Alert, cooperative, no distress, appears stated age  Head:    Normocephalic, without obvious abnormality, atraumatic  Eyes:    PERRL, conjunctiva/corneas clear, EOM's intact,          Ears:    Normal TM's and external ear canals, both ears  Nose:   Nares normal, septum midline, mucosa normal, no drainage    or sinus tenderness  Throat:   Lips,  mucosa, and tongue normal;  gums normal  Neck:   Supple, symmetrical, trachea midline, no adenopathy;       thyroid:  No enlargement/tenderness/nodules; no carotid   bruit or JVD  Back:     Symmetric, no curvature, ROM normal, no CVA tenderness  Lungs:     Clear to auscultation bilaterally, respirations unlabored  Chest wall:    No tenderness or deformity  Heart:    Regular rate and rhythm, S1 and S2 normal, no murmur, rub   or gallop  Abdomen:     Soft, non-tender, bowel sounds active all four quadrants,    no masses, no organomegaly        Extremities:   Extremities  normal, atraumatic, no cyanosis or edema  Pulses:   2+ and symmetric all extremities  Skin:   Skin color, texture, turgor normal, no rashes or lesions  Lymph nodes:   Cervical, supraclavicular, and axillary nodes normal  Neurologic:   CNII-XII intact. Normal strength, sensation and reflexes      throughout     ECOG = 0  0 - Asymptomatic (Fully active, able to carry on all predisease activities without restriction)  LABORATORY DATA:  Lab Results  Component Value Date   WBC 9.6 09/14/2014   HGB 15.1 09/14/2014   HCT 45.3 09/14/2014   MCV 101.0* 09/14/2014   PLT 336 09/14/2014   NEUTROABS 8.0* 09/14/2014   Lab Results  Component Value Date   NA 128* 09/14/2014   K 4.5 09/14/2014   CL 95* 09/12/2011   CO2 25 09/14/2014   GLUCOSE 109 09/14/2014   CREATININE 0.7 09/14/2014   CALCIUM 9.2 09/14/2014      RADIOGRAPHY: Mr Jeri Cos Wo Contrast  09/10/2014   CLINICAL DATA:  Lung cancer. Staging. No reported neurologic symptoms.  EXAM: MRI HEAD WITHOUT AND WITH CONTRAST  TECHNIQUE: Multiplanar, multiecho pulse sequences of the brain and surrounding structures were obtained without and with intravenous contrast.  CONTRAST:  71mL MULTIHANCE GADOBENATE DIMEGLUMINE 529 MG/ML IV SOLN  BUN and creatinine were obtained on site at [facility] at  [address]  Results:  BUN 15.0 mg/dL,  Creatinine 0.7 mg/dL.   COMPARISON:  None.  FINDINGS: No evidence for acute infarction, hemorrhage, mass lesion, hydrocephalus, or extra-axial fluid. Generalized atrophy. Moderate T2 and FLAIR hyperintensities throughout the periventricular and subcortical white matter consistent with chronic microvascular ischemic change. No vasogenic edema. Flow voids are maintained throughout the carotid, basilar, and vertebral arteries. There are no areas of chronic hemorrhage. Normal pituitary and cerebellar tonsils. Mild cervical spondylosis. Heterogenous C3 vertebral body, probably degenerative, but PET scan recommended for staging for osseous disease.  Post infusion, no abnormal enhancement of the brain or meninges. Extracranial soft tissues grossly unremarkable.  IMPRESSION: Atrophy and moderate small vessel disease. No acute intracranial findings.  No visible intracranial metastatic disease.  Heterogeneous appearance to the C3 vertebral body, likely degenerative in nature but incompletely evaluated; consider PET scan for staging.   Electronically Signed   By: Rolla Flatten M.D.   On: 09/10/2014 13:52   Nm Pet Image Initial (pi) Skull Base To Thigh  09/11/2014   CLINICAL DATA:  Initial treatment strategy for left lung small cell carcinoma.  EXAM: NUCLEAR MEDICINE PET SKULL BASE TO THIGH  TECHNIQUE: 7.2 mCi F-18 FDG was injected intravenously. Full-ring PET imaging was performed from the skull base to thigh after the radiotracer. CT data was obtained and used for attenuation correction and anatomic localization.  FASTING BLOOD GLUCOSE:  Value: 107 mg/dl  COMPARISON:  Abdomen only CT on 07/30/2014  FINDINGS: NECK  No hypermetabolic lymph nodes in the neck.  CHEST  2 cm irregular left infrahilar mass is hypermetabolic, with SUV max of 8.3. This is consistent with primary bronchogenic carcinoma. No other hypermetabolic hilar or mediastinal lymph nodes are identified.  ABDOMEN/PELVIS  No abnormal hypermetabolic activity within the liver, pancreas,  adrenal glands, or spleen.  Lymphadenopathy is seen in the portacaval space measuring 2 cm, which is hypermetabolic with SUV max of 76.1. No other hypermetabolic lymphadenopathy identified within the abdomen or pelvis.  SKELETON  No focal hypermetabolic activity to suggest skeletal metastasis.  IMPRESSION: 2 cm hypermetabolic left infrahilar mass, consistent  with primary bronchogenic carcinoma.  No evidence of hypermetabolic thoracic lymph nodes.  2 cm hypermetabolic portacaval lymph node in the right abdomen, highly suspicious for metastatic disease. No other sites of hypermetabolic lymphadenopathy or distant metastatic disease identified.   Electronically Signed   By: Earle Gell M.D.   On: 09/11/2014 15:15      IMPRESSION: Extensive stage small cell lung cancer. The patient's PET scan shows hypermetabolic activity within a lymph node mass in the porta hepatis and therefore the patient would be considered extensive stage small cell lung cancer. The patient would not be a candidate for chest consolidation in light of this issue. He has no evidence of metastatic disease to the brain.  PLAN:the patient will continue with systemic chemotherapy. No immediate plans for radiation therapy at this time.   I spent 60 minutes minutes face to face with the patient and more than 50% of that time was spent in counseling and/or coordination of care.   ------------------------------------------------  Blair Promise, PhD, MD

## 2014-09-18 ENCOUNTER — Other Ambulatory Visit: Payer: Self-pay | Admitting: *Deleted

## 2014-09-18 DIAGNOSIS — C3490 Malignant neoplasm of unspecified part of unspecified bronchus or lung: Secondary | ICD-10-CM

## 2014-09-21 ENCOUNTER — Ambulatory Visit (HOSPITAL_BASED_OUTPATIENT_CLINIC_OR_DEPARTMENT_OTHER): Payer: Managed Care, Other (non HMO) | Admitting: Physician Assistant

## 2014-09-21 ENCOUNTER — Telehealth: Payer: Self-pay | Admitting: Internal Medicine

## 2014-09-21 ENCOUNTER — Encounter: Payer: Self-pay | Admitting: Physician Assistant

## 2014-09-21 ENCOUNTER — Other Ambulatory Visit (HOSPITAL_BASED_OUTPATIENT_CLINIC_OR_DEPARTMENT_OTHER): Payer: Managed Care, Other (non HMO)

## 2014-09-21 VITALS — BP 139/73 | HR 73 | Temp 98.4°F | Resp 18 | Ht 68.0 in | Wt 149.4 lb

## 2014-09-21 DIAGNOSIS — C3492 Malignant neoplasm of unspecified part of left bronchus or lung: Secondary | ICD-10-CM

## 2014-09-21 DIAGNOSIS — C3490 Malignant neoplasm of unspecified part of unspecified bronchus or lung: Secondary | ICD-10-CM

## 2014-09-21 DIAGNOSIS — C7A1 Malignant poorly differentiated neuroendocrine tumors: Secondary | ICD-10-CM

## 2014-09-21 DIAGNOSIS — R591 Generalized enlarged lymph nodes: Secondary | ICD-10-CM

## 2014-09-21 LAB — CBC WITH DIFFERENTIAL/PLATELET
BASO%: 0.2 % (ref 0.0–2.0)
BASOS ABS: 0 10*3/uL (ref 0.0–0.1)
EOS%: 0.9 % (ref 0.0–7.0)
Eosinophils Absolute: 0.1 10*3/uL (ref 0.0–0.5)
HCT: 35.7 % — ABNORMAL LOW (ref 38.4–49.9)
HEMOGLOBIN: 12.6 g/dL — AB (ref 13.0–17.1)
LYMPH#: 0.9 10*3/uL (ref 0.9–3.3)
LYMPH%: 15 % (ref 14.0–49.0)
MCH: 33.8 pg — AB (ref 27.2–33.4)
MCHC: 35.3 g/dL (ref 32.0–36.0)
MCV: 95.7 fL (ref 79.3–98.0)
MONO#: 0.3 10*3/uL (ref 0.1–0.9)
MONO%: 5.1 % (ref 0.0–14.0)
NEUT#: 4.6 10*3/uL (ref 1.5–6.5)
NEUT%: 78.8 % — ABNORMAL HIGH (ref 39.0–75.0)
Platelets: 148 10*3/uL (ref 140–400)
RBC: 3.73 10*6/uL — ABNORMAL LOW (ref 4.20–5.82)
RDW: 12.6 % (ref 11.0–14.6)
WBC: 5.9 10*3/uL (ref 4.0–10.3)

## 2014-09-21 LAB — COMPREHENSIVE METABOLIC PANEL (CC13)
ALK PHOS: 101 U/L (ref 40–150)
ALT: 15 U/L (ref 0–55)
AST: 13 U/L (ref 5–34)
Albumin: 3.7 g/dL (ref 3.5–5.0)
Anion Gap: 9 mEq/L (ref 3–11)
BUN: 15.5 mg/dL (ref 7.0–26.0)
CO2: 27 mEq/L (ref 22–29)
Calcium: 9.1 mg/dL (ref 8.4–10.4)
Chloride: 91 mEq/L — ABNORMAL LOW (ref 98–109)
Creatinine: 0.8 mg/dL (ref 0.7–1.3)
Glucose: 104 mg/dl (ref 70–140)
POTASSIUM: 5 meq/L (ref 3.5–5.1)
Sodium: 127 mEq/L — ABNORMAL LOW (ref 136–145)
Total Bilirubin: 0.99 mg/dL (ref 0.20–1.20)
Total Protein: 6.1 g/dL — ABNORMAL LOW (ref 6.4–8.3)

## 2014-09-21 MED ORDER — OXYCODONE-ACETAMINOPHEN 5-325 MG PO TABS: ORAL_TABLET | ORAL | Status: DC

## 2014-09-21 NOTE — Patient Instructions (Signed)
The MRI of your brain was negative for brain metastasis. The PET scan did reveal a lymph node in your abdomen, changing your diagnosis from Limited stage small cell lung cancer to extensive stage small cell lung cancer. Continue with weekly labs as scheduled Follow-up in 2 weeks prior to the start of your next scheduled cycle of chemotherapy

## 2014-09-21 NOTE — Progress Notes (Addendum)
No images are attached to the encounter. No scans are attached to the encounter. No scans are attached to the encounter. [facility] VISIT PROGRESS NOTE  [name], [place] [address]  DIAGNOSIS: Small cell lung cancer   Staging form: Lung, AJCC 7th Edition     Clinical: T2, N1, M0 - Unsigned Extensive Stage  PRIOR THERAPY: none  CURRENT THERAPY: Systemic chemotherapy with cisplatin 60 MG/M2 on day 1 and etoposide 120 MG/M2 on days 1, 2 and 3 with Neulasta support on day 4. Status post 1 cycle.  INTERVAL HISTORY: Spencer Long 63 y.o. male returns for a scheduled regular symptom management visit for followup of his recently diagnosed small cell lung cancer. He thought to be limited stage disease pending the reading results of the MRI of the brain and the PET scan. Recently completed staging with an MRI of the brain and a PET scan and also presents to discuss those results. Overall he tolerated his first cycle of systemic chemotherapy with cisplatin and etoposide with Neulasta support relatively well with the exception of some intermittent hiccups at that house resolved and some mild diarrhea and constipation that also has resolved. He denied any issues with shortness of breath, cough, hemoptysis, fever, chills, nausea or vomiting. He is drinking at least 64 ounces of fluids daily is sleeping well and has a good appetite.  MEDICAL HISTORY: Past Medical History  Diagnosis Date  . Hypertension     under control, has been on med. x 15-20 yrs.  [name] GERD (gastroesophageal reflux disease)   . Infection of elbow     right-no infection at present , but has skin lesions present  . Sarcoidosis     "tends to have skin flareups" and presently experiencing now"feet, legs and arms"  . Chronic hyponatremia     Dr. Buddy Duty follows  . Lung cancer     ALLERGIES:  is allergic to varenicline; isothiazolinone chloride; and  quaternium-15.  MEDICATIONS:  Current Outpatient Prescriptions  Medication Sig Dispense Refill  . acetaminophen (TYLENOL) 500 MG tablet Take 1,000 mg by mouth 3 (three) times daily.     [name] amLODipine (NORVASC) 10 MG tablet Take 10 mg by mouth every morning.     [name] atenolol (TENORMIN) 25 MG tablet Take 25 mg by mouth 2 (two) times daily.      . Azilsartan Medoxomil (EDARBI) 80 MG TABS Take 1 tablet by mouth every morning.    [name] buPROPion (WELLBUTRIN SR) 150 MG 12 hr tablet Take 150mg  daily for one week, then take 150mg  by mouth twice a day 60 tablet 2  . clobetasol cream (TEMOVATE) 0.93 % Apply 1 application topically 2 (two) times daily as needed.    . dicyclomine (BENTYL) 20 MG tablet Take 20 mg by mouth every 4 (four) hours as needed for spasms.     . folic acid (FOLVITE) 1 MG tablet Take 1 mg by mouth 2 (two) times daily.    [name] glucosamine-chondroitin 500-400 MG tablet Take 1 tablet by mouth 2 (two) times daily.    . Multiple Vitamins-Minerals (MULTIVITAMIN ADULTS 50+ PO) Take 1 tablet by mouth every morning.     . nicotine (NICODERM CQ - DOSED IN MG/24 HOURS) 21 mg/24hr patch Place 1 patch (21 mg total) onto the skin daily. 28 patch 0  . omeprazole (PRILOSEC) 40 MG capsule Take 40 mg by mouth every morning.    . Probiotic Product (PROBIOTIC DAILY PO)  Take 1 tablet by mouth every morning.     . prochlorperazine (COMPAZINE) 10 MG tablet Take 1 tablet (10 mg total) by mouth every 6 (six) hours as needed for nausea. 60 tablet 1  . sodium chloride 1 G tablet Take 1 g by mouth 3 (three) times daily.    [name] aspirin EC 81 MG tablet Take 81 mg by mouth every morning.     . diclofenac (VOLTAREN) 75 MG EC tablet Take 75 mg by mouth every 12 (twelve) hours.     . ondansetron (ZOFRAN) 8 MG tablet Take 1 tablet (8 mg total) by mouth every 8 (eight) hours as needed for nausea or vomiting (start on 3rd day after chemo). (Patient not taking: Reported on 09/21/2014) 20 tablet 1  . oxyCODONE-acetaminophen  (PERCOCET/ROXICET) 5-325 MG per tablet Take 1/2 to 1 tablet by mouth every 4 to 6 hours as needed for pain 30 tablet 0  . white petrolatum (VASELINE) GEL Apply 1 application topically daily as needed for lip care or dry skin.     No current facility-administered medications for this visit.    SURGICAL HISTORY:  Past Surgical History  Procedure Laterality Date  . Cholecystectomy  05/2006  . Ulnar nerve repair  01/2008    right; decompression  . Carpal tunnel release  01/2008    right  . Eye surgery  12/2008    correction ectropion, release band, wedge exc., shortening  . I&d extremity  09/12/2011    Procedure: IRRIGATION AND DEBRIDEMENT EXTREMITY;  Surgeon: Tennis Must;  Location: [facility];  Service: Orthopedics;  Laterality: Right;  I&d RIGHT OLECRANON BURSSA   . Tonsillectomy    . Endobronchial ultrasound Bilateral 08/24/2014    Procedure: ENDOBRONCHIAL ULTRASOUND;  Surgeon: Collene Gobble, MD;  Location: [facility];  Service: Cardiopulmonary;  Laterality: Bilateral;    REVIEW OF SYSTEMS:  Review of Systems  Constitutional: Negative for fever, chills, weight loss, malaise/fatigue and diaphoresis.  HENT: Negative for congestion, ear discharge, ear pain, hearing loss, nosebleeds, sore throat and tinnitus.   Eyes: Negative for blurred vision, double vision, photophobia, pain, discharge and redness.  Respiratory: Negative for cough, hemoptysis, sputum production, shortness of breath, wheezing and stridor.   Cardiovascular: Negative for chest pain, palpitations, orthopnea, claudication, leg swelling and PND.  Gastrointestinal: Positive for diarrhea and constipation. Negative for heartburn, nausea, vomiting, abdominal pain, blood in stool and melena.       Hiccups  Genitourinary: Negative.   Musculoskeletal: Negative.   Skin: Negative.   Neurological: Negative for dizziness, tingling, focal weakness, seizures, weakness and headaches.  Endo/Heme/Allergies: Does not  bruise/bleed easily.  Psychiatric/Behavioral: Negative for depression. The patient is not nervous/anxious and does not have insomnia.      PHYSICAL EXAMINATION: Physical Exam  Constitutional: He is oriented to person, place, and time and well-developed, well-nourished, and in no distress.  HENT:  Head: Normocephalic and atraumatic.  Mouth/Throat: Oropharynx is clear and moist.  Eyes: Pupils are equal, round, and reactive to light.  Neck: Normal range of motion. Neck supple. No JVD present. No tracheal deviation present. No thyromegaly present.  Cardiovascular: Normal rate, regular rhythm, normal heart sounds and intact distal pulses.  Exam reveals no gallop and no friction rub.   No murmur heard. Pulmonary/Chest: Effort normal and breath sounds normal. No respiratory distress. He has no wheezes. He has no rales.  Abdominal: Soft. Bowel sounds are normal. He exhibits no distension and no mass. There is no tenderness.  Musculoskeletal:  Normal range of motion. He exhibits no edema or tenderness.  Lymphadenopathy:    He has no cervical adenopathy.  Neurological: He is alert and oriented to person, place, and time. He has normal reflexes. Gait normal.  Skin: Skin is warm and dry. No rash noted.    ECOG PERFORMANCE STATUS: 1 - Symptomatic but completely ambulatory  Blood pressure 139/73, pulse 73, temperature 98.4 F (36.9 C), temperature source Oral, resp. rate 18, height 5\' 8"  (1.727 m), weight 149 lb 6.4 oz (67.767 kg), SpO2 100 %.  LABORATORY DATA: Lab Results  Component Value Date   WBC 5.9 09/21/2014   HGB 12.6* 09/21/2014   HCT 35.7* 09/21/2014   MCV 95.7 09/21/2014   PLT 148 09/21/2014      Chemistry      Component Value Date/Time   NA 127* 09/21/2014 0946   NA 125* 09/12/2011 1302   K 5.0 09/21/2014 0946   K 5.5* 09/12/2011 1302   CL 95* 09/12/2011 1302   CO2 27 09/21/2014 0946   CO2 26 12/25/2008 1425   BUN 15.5 09/21/2014 0946   BUN 19 09/12/2011 1302    CREATININE 0.8 09/21/2014 0946   CREATININE 1.00 09/12/2011 1302      Component Value Date/Time   CALCIUM 9.1 09/21/2014 0946   CALCIUM 9.1 12/25/2008 1425   ALKPHOS 101 09/21/2014 0946   AST 13 09/21/2014 0946   ALT 15 09/21/2014 0946   BILITOT 0.99 09/21/2014 0946       RADIOGRAPHIC STUDIES:  Mr Jeri Cos Wo Contrast  09/10/2014   CLINICAL DATA:  Lung cancer. Staging. No reported neurologic symptoms.  EXAM: MRI HEAD WITHOUT AND WITH CONTRAST  TECHNIQUE: Multiplanar, multiecho pulse sequences of the brain and surrounding structures were obtained without and with intravenous contrast.  CONTRAST:  34mL MULTIHANCE GADOBENATE DIMEGLUMINE 529 MG/ML IV SOLN  BUN and creatinine were obtained on site at [facility] at  [address]  Results:  BUN 15.0 mg/dL,  Creatinine 0.7 mg/dL.  COMPARISON:  None.  FINDINGS: No evidence for acute infarction, hemorrhage, mass lesion, hydrocephalus, or extra-axial fluid. Generalized atrophy. Moderate T2 and FLAIR hyperintensities throughout the periventricular and subcortical white matter consistent with chronic microvascular ischemic change. No vasogenic edema. Flow voids are maintained throughout the carotid, basilar, and vertebral arteries. There are no areas of chronic hemorrhage. Normal pituitary and cerebellar tonsils. Mild cervical spondylosis. Heterogenous C3 vertebral body, probably degenerative, but PET scan recommended for staging for osseous disease.  Post infusion, no abnormal enhancement of the brain or meninges. Extracranial soft tissues grossly unremarkable.  IMPRESSION: Atrophy and moderate small vessel disease. No acute intracranial findings.  No visible intracranial metastatic disease.  Heterogeneous appearance to the C3 vertebral body, likely degenerative in nature but incompletely evaluated; consider PET scan for staging.   Electronically Signed   By: Rolla Flatten M.D.   On: 09/10/2014 13:52   Nm Pet Image Initial (pi) Skull Base To  Thigh  09/11/2014   CLINICAL DATA:  Initial treatment strategy for left lung small cell carcinoma.  EXAM: NUCLEAR MEDICINE PET SKULL BASE TO THIGH  TECHNIQUE: 7.2 mCi F-18 FDG was injected intravenously. Full-ring PET imaging was performed from the skull base to thigh after the radiotracer. CT data was obtained and used for attenuation correction and anatomic localization.  FASTING BLOOD GLUCOSE:  Value: 107 mg/dl  COMPARISON:  Abdomen only CT on 07/30/2014  FINDINGS: NECK  No hypermetabolic lymph nodes in the neck.  CHEST  2 cm  irregular left infrahilar mass is hypermetabolic, with SUV max of 8.3. This is consistent with primary bronchogenic carcinoma. No other hypermetabolic hilar or mediastinal lymph nodes are identified.  ABDOMEN/PELVIS  No abnormal hypermetabolic activity within the liver, pancreas, adrenal glands, or spleen.  Lymphadenopathy is seen in the portacaval space measuring 2 cm, which is hypermetabolic with SUV max of 14.4. No other hypermetabolic lymphadenopathy identified within the abdomen or pelvis.  SKELETON  No focal hypermetabolic activity to suggest skeletal metastasis.  IMPRESSION: 2 cm hypermetabolic left infrahilar mass, consistent with primary bronchogenic carcinoma.  No evidence of hypermetabolic thoracic lymph nodes.  2 cm hypermetabolic portacaval lymph node in the right abdomen, highly suspicious for metastatic disease. No other sites of hypermetabolic lymphadenopathy or distant metastatic disease identified.   Electronically Signed   By: Earle Gell M.D.   On: 09/11/2014 15:15     ASSESSMENT/PLAN:  No problem-specific assessment & plan notes found for this encounter. Small cell lung cancer Patient is a pleasant 63 year old Caucasian male recently diagnosed with small cell lung cancer. This was initially thought to be limited stage however the PET scan revealed a 2 cm hypermetabolic porta caval lymph node in the right abdomen in addition to this 2 cm hypermetabolic left  infrahilar mass. The MRI of the brain was negative for brain metastasis. This now constitutes extensive stage small cell lung cancer. He will continue with his current systemic chemotherapy with cisplatin and etoposide with Neulasta support however this will not be concurrent with radiation therapy. The results of the MRI and PET scan were reviewed with patient and his wife by Dr. [name]. All their questions were answered to their satisfaction. He will continue with weekly labs. He'll return in 2 weeks prior to the start of cycle #2. 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 We've asked him to discontinue this medication as it may have a negative impact on his renal function as well as his platelet count. He'll be started on Percocet 5/325 mg tablets, a half tablet to one tablet by mouth every 4-6 hours as needed for pain. He was given a prescription for 30 tablets with no refill.  Patient was discussed with and also seen by Dr. [name].  Carlton Adam, PA-C 09/21/2014 All questions were answered. The patient knows to call the clinic with any problems, questions or concerns. We can certainly see the patient much sooner if necessary.   ADDENDUM: Hematology/Oncology Attending: I had a face to face encounter with the patient today. I recommended his care plan.  This is a very pleasant 63 years old white male who was recently diagnosed with extensive stage small cell lung cancer and currently undergoing systemic chemotherapy with cisplatin and etoposide status post 1 cycle given last week. He tolerated the first week of his treatment fairly well with no significant adverse effects. Unfortunately the PET scan showed evidence for portacaval lymphadenopathy in the abdomen in addition to the previously known disease in the chest. I discussed the PET scan results with the patient and his wife. I recommended for the patient to continue with his systemic  chemotherapy as previously scheduled but he will not receive concurrent radiation unless he has evidence for residual disease only in the chest after completion of the chemotherapy. He was advised to discontinue his current treatment with Voltaren. We'll start him on Percocet 5/325 mg as needed for pain. He would come back for follow-up visit in 2 weeks for reevaluation  with the start of cycle #2. The patient was advised to call immediately if he has any concerning symptoms in the interval.  Disclaimer: This note was dictated with voice recognition software. Similar sounding words can inadvertently be transcribed and may be missed upon review. Eilleen Kempf., MD 09/21/2014

## 2014-09-21 NOTE — Telephone Encounter (Signed)
gv and printeda ppt sched and avs for pt for NOV adn DEC....sed added tx.

## 2014-09-21 NOTE — Assessment & Plan Note (Signed)
Patient is a pleasant 63 year old Caucasian male recently diagnosed with small cell lung cancer. This was initially thought to be limited stage however the PET scan revealed a 2 cm hypermetabolic porta caval lymph node in the right abdomen in addition to this 2 cm hypermetabolic left infrahilar mass. The MRI of the brain was negative for brain metastasis. This now constitutes extensive stage small cell lung cancer. He will continue with his current systemic chemotherapy with cisplatin and etoposide with Neulasta support however this will not be concurrent with radiation therapy. The results of the MRI and PET scan were reviewed with patient and his wife by Dr. [name]. All their questions were answered to their satisfaction. He will continue with weekly labs. He'll return in 2 weeks prior to the start of cycle #2. 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 We've asked him to discontinue this medication as it may have a negative impact on his renal function as well as his platelet count. He'll be started on Percocet 5/325 mg tablets, a half tablet to one tablet by mouth every 4-6 hours as needed for pain. He was given a prescription for 30 tablets with no refill.

## 2014-09-22 ENCOUNTER — Encounter: Payer: Self-pay | Admitting: Physician Assistant

## 2014-09-28 ENCOUNTER — Other Ambulatory Visit (HOSPITAL_BASED_OUTPATIENT_CLINIC_OR_DEPARTMENT_OTHER): Payer: Managed Care, Other (non HMO)

## 2014-09-28 DIAGNOSIS — C7A1 Malignant poorly differentiated neuroendocrine tumors: Secondary | ICD-10-CM

## 2014-09-28 DIAGNOSIS — C3492 Malignant neoplasm of unspecified part of left bronchus or lung: Secondary | ICD-10-CM

## 2014-09-28 LAB — CBC WITH DIFFERENTIAL/PLATELET
BASO%: 0.5 % (ref 0.0–2.0)
BASOS ABS: 0.1 10*3/uL (ref 0.0–0.1)
EOS ABS: 0.1 10*3/uL (ref 0.0–0.5)
EOS%: 0.5 % (ref 0.0–7.0)
HCT: 36.7 % — ABNORMAL LOW (ref 38.4–49.9)
HEMOGLOBIN: 12.4 g/dL — AB (ref 13.0–17.1)
LYMPH%: 11.1 % — AB (ref 14.0–49.0)
MCH: 33.3 pg (ref 27.2–33.4)
MCHC: 33.7 g/dL (ref 32.0–36.0)
MCV: 98.8 fL — AB (ref 79.3–98.0)
MONO#: 1.6 10*3/uL — ABNORMAL HIGH (ref 0.1–0.9)
MONO%: 11.6 % (ref 0.0–14.0)
NEUT#: 10.6 10*3/uL — ABNORMAL HIGH (ref 1.5–6.5)
NEUT%: 76.3 % — AB (ref 39.0–75.0)
PLATELETS: 211 10*3/uL (ref 140–400)
RBC: 3.72 10*6/uL — ABNORMAL LOW (ref 4.20–5.82)
RDW: 13.1 % (ref 11.0–14.6)
WBC: 13.9 10*3/uL — ABNORMAL HIGH (ref 4.0–10.3)
lymph#: 1.5 10*3/uL (ref 0.9–3.3)

## 2014-09-28 LAB — COMPREHENSIVE METABOLIC PANEL (CC13)
ALBUMIN: 3.9 g/dL (ref 3.5–5.0)
ALK PHOS: 95 U/L (ref 40–150)
ALT: 14 U/L (ref 0–55)
AST: 17 U/L (ref 5–34)
Anion Gap: 9 mEq/L (ref 3–11)
BILIRUBIN TOTAL: 0.27 mg/dL (ref 0.20–1.20)
BUN: 10.3 mg/dL (ref 7.0–26.0)
CO2: 28 mEq/L (ref 22–29)
CREATININE: 0.8 mg/dL (ref 0.7–1.3)
Calcium: 9.4 mg/dL (ref 8.4–10.4)
Chloride: 96 mEq/L — ABNORMAL LOW (ref 98–109)
GLUCOSE: 109 mg/dL (ref 70–140)
Potassium: 5.4 mEq/L — ABNORMAL HIGH (ref 3.5–5.1)
Sodium: 133 mEq/L — ABNORMAL LOW (ref 136–145)
Total Protein: 6.3 g/dL — ABNORMAL LOW (ref 6.4–8.3)

## 2014-09-28 LAB — MAGNESIUM (CC13): MAGNESIUM: 2.3 mg/dL (ref 1.5–2.5)

## 2014-10-01 ENCOUNTER — Telehealth: Payer: Self-pay | Admitting: Pulmonary Disease

## 2014-10-01 NOTE — Telephone Encounter (Signed)
OK by me 

## 2014-10-01 NOTE — Telephone Encounter (Signed)
BQ pt is requesting a refill of the nicoderm patches.  He is almost out.  Do you want to decrease the dose or keep the same?  Please advise. Thanks  Allergies  Allergen Reactions  . Varenicline     chantix  . Isothiazolinone Chloride Rash  . Quaternium-15 Rash    Current Outpatient Prescriptions on File Prior to Visit  Medication Sig Dispense Refill  . acetaminophen (TYLENOL) 500 MG tablet Take 1,000 mg by mouth 3 (three) times daily.     [name] amLODipine (NORVASC) 10 MG tablet Take 10 mg by mouth every morning.     [name] aspirin EC 81 MG tablet Take 81 mg by mouth every morning.     [name] atenolol (TENORMIN) 25 MG tablet Take 25 mg by mouth 2 (two) times daily.      . Azilsartan Medoxomil (EDARBI) 80 MG TABS Take 1 tablet by mouth every morning.    [name] buPROPion (WELLBUTRIN SR) 150 MG 12 hr tablet Take 150mg  daily for one week, then take 150mg  by mouth twice a day 60 tablet 2  . clobetasol cream (TEMOVATE) 6.60 % Apply 1 application topically 2 (two) times daily as needed.    . diclofenac (VOLTAREN) 75 MG EC tablet Take 75 mg by mouth every 12 (twelve) hours.     [name] dicyclomine (BENTYL) 20 MG tablet Take 20 mg by mouth every 4 (four) hours as needed for spasms.     . folic acid (FOLVITE) 1 MG tablet Take 1 mg by mouth 2 (two) times daily.    [name] glucosamine-chondroitin 500-400 MG tablet Take 1 tablet by mouth 2 (two) times daily.    . Multiple Vitamins-Minerals (MULTIVITAMIN ADULTS 50+ PO) Take 1 tablet by mouth every morning.     . nicotine (NICODERM CQ - DOSED IN MG/24 HOURS) 21 mg/24hr patch Place 1 patch (21 mg total) onto the skin daily. 28 patch 0  . omeprazole (PRILOSEC) 40 MG capsule Take 40 mg by mouth every morning.    . ondansetron (ZOFRAN) 8 MG tablet Take 1 tablet (8 mg total) by mouth every 8 (eight) hours as needed for nausea or vomiting (start on 3rd day after chemo). (Patient not taking: Reported on 09/21/2014) 20 tablet 1  . oxyCODONE-acetaminophen (PERCOCET/ROXICET) 5-325 MG per tablet  Take 1/2 to 1 tablet by mouth every 4 to 6 hours as needed for pain 30 tablet 0  . Probiotic Product (PROBIOTIC DAILY PO) Take 1 tablet by mouth every morning.     . prochlorperazine (COMPAZINE) 10 MG tablet Take 1 tablet (10 mg total) by mouth every 6 (six) hours as needed for nausea. 60 tablet 1  . sodium chloride 1 G tablet Take 1 g by mouth 3 (three) times daily.    . white petrolatum (VASELINE) GEL Apply 1 application topically daily as needed for lip care or dry skin.     No current facility-administered medications on file prior to visit.

## 2014-10-02 MED ORDER — NICOTINE 21 MG/24HR TD PT24: 21.0000 mg | MEDICATED_PATCH | Freq: Every day | TRANSDERMAL | Status: DC

## 2014-10-02 NOTE — Telephone Encounter (Signed)
Refilled nicotine patches.  Nothing further needed.

## 2014-10-05 ENCOUNTER — Ambulatory Visit (HOSPITAL_BASED_OUTPATIENT_CLINIC_OR_DEPARTMENT_OTHER): Payer: Managed Care, Other (non HMO) | Admitting: Physician Assistant

## 2014-10-05 ENCOUNTER — Other Ambulatory Visit (HOSPITAL_BASED_OUTPATIENT_CLINIC_OR_DEPARTMENT_OTHER): Payer: Managed Care, Other (non HMO)

## 2014-10-05 ENCOUNTER — Ambulatory Visit: Payer: Managed Care, Other (non HMO) | Admitting: Nutrition

## 2014-10-05 ENCOUNTER — Ambulatory Visit: Payer: Managed Care, Other (non HMO)

## 2014-10-05 ENCOUNTER — Encounter: Payer: Self-pay | Admitting: Physician Assistant

## 2014-10-05 ENCOUNTER — Ambulatory Visit (HOSPITAL_BASED_OUTPATIENT_CLINIC_OR_DEPARTMENT_OTHER): Payer: Managed Care, Other (non HMO)

## 2014-10-05 VITALS — BP 147/74 | HR 79 | Temp 98.2°F | Resp 17 | Ht 68.0 in | Wt 149.2 lb

## 2014-10-05 DIAGNOSIS — C7A1 Malignant poorly differentiated neuroendocrine tumors: Secondary | ICD-10-CM

## 2014-10-05 DIAGNOSIS — Z5111 Encounter for antineoplastic chemotherapy: Secondary | ICD-10-CM

## 2014-10-05 DIAGNOSIS — R21 Rash and other nonspecific skin eruption: Secondary | ICD-10-CM

## 2014-10-05 DIAGNOSIS — C3492 Malignant neoplasm of unspecified part of left bronchus or lung: Secondary | ICD-10-CM

## 2014-10-05 LAB — COMPREHENSIVE METABOLIC PANEL (CC13)
ALT: 22 U/L (ref 0–55)
ANION GAP: 11 meq/L (ref 3–11)
AST: 17 U/L (ref 5–34)
Albumin: 4 g/dL (ref 3.5–5.0)
Alkaline Phosphatase: 79 U/L (ref 40–150)
BUN: 14.7 mg/dL (ref 7.0–26.0)
CO2: 25 mEq/L (ref 22–29)
CREATININE: 0.8 mg/dL (ref 0.7–1.3)
Calcium: 9.3 mg/dL (ref 8.4–10.4)
Chloride: 95 mEq/L — ABNORMAL LOW (ref 98–109)
EGFR: >90 mL/min/{1.73_m2} (ref >90)
Glucose: 105 mg/dl (ref 70–140)
Potassium: 4.7 mEq/L (ref 3.5–5.1)
Sodium: 131 mEq/L — ABNORMAL LOW (ref 136–145)
Total Bilirubin: 0.49 mg/dL (ref 0.20–1.20)
Total Protein: 6.5 g/dL (ref 6.4–8.3)

## 2014-10-05 LAB — CBC WITH DIFFERENTIAL/PLATELET
BASO%: 0.7 % (ref 0.0–2.0)
Basophils Absolute: 0.1 10*3/uL (ref 0.0–0.1)
EOS%: 0.3 % (ref 0.0–7.0)
Eosinophils Absolute: 0 10*3/uL (ref 0.0–0.5)
HEMATOCRIT: 38.9 % (ref 38.4–49.9)
HGB: 13 g/dL (ref 13.0–17.1)
LYMPH%: 9.9 % — ABNORMAL LOW (ref 14.0–49.0)
MCH: 32.7 pg (ref 27.2–33.4)
MCHC: 33.4 g/dL (ref 32.0–36.0)
MCV: 98 fL (ref 79.3–98.0)
MONO#: 1.4 10*3/uL — ABNORMAL HIGH (ref 0.1–0.9)
MONO%: 11 % (ref 0.0–14.0)
NEUT#: 10.1 10*3/uL — ABNORMAL HIGH (ref 1.5–6.5)
NEUT%: 78.1 % — ABNORMAL HIGH (ref 39.0–75.0)
Platelets: 345 10*3/uL (ref 140–400)
RBC: 3.97 10*6/uL — ABNORMAL LOW (ref 4.20–5.82)
RDW: 13.1 % (ref 11.0–14.6)
WBC: 12.9 10*3/uL — ABNORMAL HIGH (ref 4.0–10.3)
lymph#: 1.3 10*3/uL (ref 0.9–3.3)

## 2014-10-05 LAB — MAGNESIUM (CC13): MAGNESIUM: 2.1 mg/dL (ref 1.5–2.5)

## 2014-10-05 MED ORDER — POTASSIUM CHLORIDE 2 MEQ/ML IV SOLN
Freq: Once | INTRAVENOUS | Status: AC
  Administered 2014-10-05: 13:00:00 via INTRAVENOUS
  Filled 2014-10-05: qty 10

## 2014-10-05 MED ORDER — SODIUM CHLORIDE 0.9 % IV SOLN
60.0000 mg/m2 | Freq: Once | INTRAVENOUS | Status: AC
  Administered 2014-10-05: 107 mg via INTRAVENOUS
  Filled 2014-10-05: qty 107

## 2014-10-05 MED ORDER — SODIUM CHLORIDE 0.9 % IV SOLN
150.0000 mg | Freq: Once | INTRAVENOUS | Status: AC
  Administered 2014-10-05: 150 mg via INTRAVENOUS
  Filled 2014-10-05: qty 5

## 2014-10-05 MED ORDER — DEXAMETHASONE SODIUM PHOSPHATE 20 MG/5ML IJ SOLN
INTRAMUSCULAR | Status: AC
  Filled 2014-10-05: qty 5

## 2014-10-05 MED ORDER — METHYLPREDNISOLONE (PAK) 4 MG PO TABS: ORAL_TABLET | ORAL | Status: DC

## 2014-10-05 MED ORDER — PALONOSETRON HCL INJECTION 0.25 MG/5ML
0.2500 mg | Freq: Once | INTRAVENOUS | Status: AC
  Administered 2014-10-05: 0.25 mg via INTRAVENOUS

## 2014-10-05 MED ORDER — SODIUM CHLORIDE 0.9 % IV SOLN
Freq: Once | INTRAVENOUS | Status: AC
  Administered 2014-10-05: 12:00:00 via INTRAVENOUS

## 2014-10-05 MED ORDER — SODIUM CHLORIDE 0.9 % IV SOLN
120.0000 mg/m2 | Freq: Once | INTRAVENOUS | Status: AC
  Administered 2014-10-05: 210 mg via INTRAVENOUS
  Filled 2014-10-05: qty 10.5

## 2014-10-05 MED ORDER — DEXAMETHASONE SODIUM PHOSPHATE 20 MG/5ML IJ SOLN
12.0000 mg | Freq: Once | INTRAMUSCULAR | Status: AC
  Administered 2014-10-05: 12 mg via INTRAVENOUS

## 2014-10-05 MED ORDER — MORPHINE SULFATE 15 MG PO TABS: 15.0000 mg | ORAL_TABLET | Freq: Four times a day (QID) | ORAL | Status: DC | PRN

## 2014-10-05 MED ORDER — PALONOSETRON HCL INJECTION 0.25 MG/5ML
INTRAVENOUS | Status: AC
  Filled 2014-10-05: qty 5

## 2014-10-05 NOTE — Patient Instructions (Signed)
Take the Medrol dosepak for your rash Take the immediate release morphine as prescribed for pain control Continue weekly labs as scheduled Follow up in 3 weeks with a restaging CT scan of your chest, abdomen and pelvis to re-evaluate your disease

## 2014-10-05 NOTE — Progress Notes (Signed)
Nutrition follow up completed with patient. Patient reports he feels well. He denies nausea and vomiting.   Reports diarrhea has improved and he is having regular bowel movements. Weight has increased and was documented as 149.2 pounds December 7 up from 145.9 pounds November.   Patient has no questions today.  Nutrition diagnosis: Food and nutrition related knowledge deficit has improved.  Intervention: Patient to continue small frequent meals and snacks throughout the day. Patient will consume oral nutrition supplements as needed to minimize weight loss. Teach back method used.   Contact information was given for further questions.  Monitoring, evaluation, goals: Patient has been able to tolerate increased calories and protein, and has had weight gain.  Next visit:Monday, December 28, during chemotherapy.  **Disclaimer: This note was dictated with voice recognition software. Similar sounding words can inadvertently be transcribed and this note may contain transcription errors which may not have been corrected upon publication of note.**

## 2014-10-05 NOTE — Patient Instructions (Addendum)
[facility] Discharge Instructions for Patients Receiving Chemotherapy  Today you received the following chemotherapy agents Cisplatin and Etoposide.  To help prevent nausea and vomiting after your treatment, we encourage you to take your nausea medication as prescribed.   If you develop nausea and vomiting that is not controlled by your nausea medication, call the clinic.   BELOW ARE SYMPTOMS THAT SHOULD BE REPORTED IMMEDIATELY:  *FEVER GREATER THAN 100.5 F  *CHILLS WITH OR WITHOUT FEVER  NAUSEA AND VOMITING THAT IS NOT CONTROLLED WITH YOUR NAUSEA MEDICATION  *UNUSUAL SHORTNESS OF BREATH  *UNUSUAL BRUISING OR BLEEDING  TENDERNESS IN MOUTH AND THROAT WITH OR WITHOUT PRESENCE OF ULCERS  *URINARY PROBLEMS  *BOWEL PROBLEMS  UNUSUAL RASH Items with * indicate a potential emergency and should be followed up as soon as possible.  Feel free to call the clinic you have any questions or concerns. The clinic phone number is [phone].

## 2014-10-05 NOTE — Progress Notes (Addendum)
No images are attached to the encounter. No scans are attached to the encounter. No scans are attached to the encounter. [facility] VISIT PROGRESS NOTE  [name], [place] [address]  DIAGNOSIS: Small cell lung cancer   Staging form: Lung, AJCC 7th Edition     Clinical: T2, N1, M0 - Unsigned Extensive Stage  PRIOR THERAPY: none  CURRENT THERAPY: Systemic chemotherapy with cisplatin 60 MG/M2 on day 1 and etoposide 120 MG/M2 on days 1, 2 and 3 with Neulasta support on day 4. Status post 1 cycle.  INTERVAL HISTORY: Spencer Long 63 y.o. male returns for a scheduled regular symptom management visit for followup of his recently diagnosed small cell lung cancer. He reports his hair has fallen out. He complains of a rash on his arms and to a lesser extent on his trunk. It is mildly itchy. He believes it is coming from his pain medication. He is currently taking two and a half tablets of oxycodone (Percocet) daily for pain control. The only other new medication he has been exposed to are his nicotine patches. He has been on the nicotine patches before he started chemotherapy.  Overall he tolerated his first cycle of systemic chemotherapy with cisplatin and etoposide with Neulasta support relatively well with the exception of some intermittent hiccups at that house resolved and some mild diarrhea and constipation that also has resolved. He denied any issues with shortness of breath, cough, hemoptysis, fever, chills, nausea or vomiting. He is drinking at least 64 ounces of fluids daily is sleeping well and has a good appetite. He presents to proceed with cycle #2.  MEDICAL HISTORY: Past Medical History  Diagnosis Date  . Hypertension     under control, has been on med. x 15-20 yrs.  [name] GERD (gastroesophageal reflux disease)   . Infection of elbow     right-no infection at present , but has skin lesions present  . Sarcoidosis      "tends to have skin flareups" and presently experiencing now"feet, legs and arms"  . Chronic hyponatremia     Dr. Buddy Duty follows  . Lung cancer     ALLERGIES:  is allergic to varenicline; isothiazolinone chloride; oxycodone; and quaternium-15.  MEDICATIONS:  Current Outpatient Prescriptions  Medication Sig Dispense Refill  . amLODipine (NORVASC) 10 MG tablet Take 10 mg by mouth every morning.     [name] aspirin EC 81 MG tablet Take 81 mg by mouth every morning.     [name] atenolol (TENORMIN) 25 MG tablet Take 25 mg by mouth 2 (two) times daily.      . Azilsartan Medoxomil (EDARBI) 80 MG TABS Take 1 tablet by mouth every morning.    [name] buPROPion (WELLBUTRIN SR) 150 MG 12 hr tablet Take 150mg  daily for one week, then take 150mg  by mouth twice a day 60 tablet 2  . clobetasol cream (TEMOVATE) 8.36 % Apply 1 application topically 2 (two) times daily as needed.    . folic acid (FOLVITE) 1 MG tablet Take 1 mg by mouth 2 (two) times daily.    . Multiple Vitamins-Minerals (MULTIVITAMIN ADULTS 50+ PO) Take 1 tablet by mouth every morning.     . nicotine (NICODERM CQ - DOSED IN MG/24 HOURS) 21 mg/24hr patch Place 1 patch (21 mg total) onto the skin daily. 28 patch 0  . omeprazole (PRILOSEC) 40 MG capsule Take 40 mg by mouth every morning.    . ondansetron (ZOFRAN)  8 MG tablet Take 1 tablet (8 mg total) by mouth every 8 (eight) hours as needed for nausea or vomiting (start on 3rd day after chemo). 20 tablet 1  . Probiotic Product (PROBIOTIC DAILY PO) Take 1 tablet by mouth every morning.     . prochlorperazine (COMPAZINE) 10 MG tablet Take 1 tablet (10 mg total) by mouth every 6 (six) hours as needed for nausea. 60 tablet 1  . sodium chloride 1 G tablet Take 1 g by mouth 3 (three) times daily.    . white petrolatum (VASELINE) GEL Apply 1 application topically daily as needed for lip care or dry skin.    [name] dicyclomine (BENTYL) 20 MG tablet Take 20 mg by mouth every 4 (four) hours as needed for spasms.     .  methylPREDNIsolone (MEDROL DOSPACK) 4 MG tablet follow package directions 21 tablet 0  . morphine (MSIR) 15 MG tablet Take 1 tablet (15 mg total) by mouth every 6 (six) hours as needed for severe pain. 30 tablet 0   No current facility-administered medications for this visit.   Facility-Administered Medications Ordered in Other Visits  Medication Dose Route Frequency Provider Last Rate Last Dose  . etoposide (VEPESID) 210 mg in sodium chloride 0.9 % 600 mL chemo infusion  120 mg/m2 (Treatment Plan Actual) Intravenous Once Curt Bears, MD 611 mL/hr at 10/05/14 1628 210 mg at 10/05/14 1628    SURGICAL HISTORY:  Past Surgical History  Procedure Laterality Date  . Cholecystectomy  05/2006  . Ulnar nerve repair  01/2008    right; decompression  . Carpal tunnel release  01/2008    right  . Eye surgery  12/2008    correction ectropion, release band, wedge exc., shortening  . I&d extremity  09/12/2011    Procedure: IRRIGATION AND DEBRIDEMENT EXTREMITY;  Surgeon: Tennis Must;  Location: [facility];  Service: Orthopedics;  Laterality: Right;  I&d RIGHT OLECRANON BURSSA   . Tonsillectomy    . Endobronchial ultrasound Bilateral 08/24/2014    Procedure: ENDOBRONCHIAL ULTRASOUND;  Surgeon: Collene Gobble, MD;  Location: [facility];  Service: Cardiopulmonary;  Laterality: Bilateral;    REVIEW OF SYSTEMS:  Review of Systems  Constitutional: Negative for fever, chills, weight loss, malaise/fatigue and diaphoresis.       Alopecia   HENT: Negative for congestion, ear discharge, ear pain, hearing loss, nosebleeds, sore throat and tinnitus.   Eyes: Negative for blurred vision, double vision, photophobia, pain, discharge and redness.  Respiratory: Negative for cough, hemoptysis, sputum production, shortness of breath, wheezing and stridor.   Cardiovascular: Negative for chest pain, palpitations, orthopnea, claudication, leg swelling and PND.  Gastrointestinal: Negative for heartburn,  nausea, vomiting, abdominal pain, diarrhea, constipation, blood in stool and melena.       Hiccups  Genitourinary: Negative.   Musculoskeletal: Negative.   Skin: Positive for itching and rash.       Largest concentration is on the upper extremities, lesser distribution on the trunk of erythematous macular to plaque- like eruptions, varying in size  Neurological: Negative for dizziness, tingling, focal weakness, seizures, weakness and headaches.  Endo/Heme/Allergies: Does not bruise/bleed easily.  Psychiatric/Behavioral: Negative for depression. The patient is not nervous/anxious and does not have insomnia.      PHYSICAL EXAMINATION: Physical Exam  Constitutional: He is oriented to person, place, and time and well-developed, well-nourished, and in no distress.  HENT:  Head: Normocephalic and atraumatic.  Mouth/Throat: Oropharynx is clear and moist.  Alopecia is present  Eyes:  Pupils are equal, round, and reactive to light.  Neck: Normal range of motion. Neck supple. No JVD present. No tracheal deviation present. No thyromegaly present.  Cardiovascular: Normal rate, regular rhythm, normal heart sounds and intact distal pulses.  Exam reveals no gallop and no friction rub.   No murmur heard. Pulmonary/Chest: Effort normal and breath sounds normal. No respiratory distress. He has no wheezes. He has no rales.  Abdominal: Soft. Bowel sounds are normal. He exhibits no distension and no mass. There is no tenderness.  Musculoskeletal: Normal range of motion. He exhibits no edema or tenderness.  Lymphadenopathy:    He has no cervical adenopathy.  Neurological: He is alert and oriented to person, place, and time. He has normal reflexes. Gait normal.  Skin: Skin is warm and dry. Rash noted.  Largest concentration is on the upper extremities, lesser distribution on the trunk of erythematous macular to plaque- like eruptions, varying in size     ECOG PERFORMANCE STATUS: 1 - Symptomatic but  completely ambulatory  Blood pressure 147/74, pulse 79, temperature 98.2 F (36.8 C), temperature source Oral, resp. rate 17, height 5\' 8"  (1.727 m), weight 149 lb 3.2 oz (67.677 kg), SpO2 97 %.  LABORATORY DATA: Lab Results  Component Value Date   WBC 12.9* 10/05/2014   HGB 13.0 10/05/2014   HCT 38.9 10/05/2014   MCV 98.0 10/05/2014   PLT 345 10/05/2014      Chemistry      Component Value Date/Time   NA 131* 10/05/2014 1025   NA 125* 09/12/2011 1302   K 4.7 10/05/2014 1025   K 5.5* 09/12/2011 1302   CL 95* 09/12/2011 1302   CO2 25 10/05/2014 1025   CO2 26 12/25/2008 1425   BUN 14.7 10/05/2014 1025   BUN 19 09/12/2011 1302   CREATININE 0.8 10/05/2014 1025   CREATININE 1.00 09/12/2011 1302      Component Value Date/Time   CALCIUM 9.3 10/05/2014 1025   CALCIUM 9.1 12/25/2008 1425   ALKPHOS 79 10/05/2014 1025   AST 17 10/05/2014 1025   ALT 22 10/05/2014 1025   BILITOT 0.49 10/05/2014 1025       RADIOGRAPHIC STUDIES:  Mr Jeri Cos EU Contrast  09/10/2014   CLINICAL DATA:  Lung cancer. Staging. No reported neurologic symptoms.  EXAM: MRI HEAD WITHOUT AND WITH CONTRAST  TECHNIQUE: Multiplanar, multiecho pulse sequences of the brain and surrounding structures were obtained without and with intravenous contrast.  CONTRAST:  72mL MULTIHANCE GADOBENATE DIMEGLUMINE 529 MG/ML IV SOLN  BUN and creatinine were obtained on site at [facility] at  [address]  Results:  BUN 15.0 mg/dL,  Creatinine 0.7 mg/dL.  COMPARISON:  None.  FINDINGS: No evidence for acute infarction, hemorrhage, mass lesion, hydrocephalus, or extra-axial fluid. Generalized atrophy. Moderate T2 and FLAIR hyperintensities throughout the periventricular and subcortical white matter consistent with chronic microvascular ischemic change. No vasogenic edema. Flow voids are maintained throughout the carotid, basilar, and vertebral arteries. There are no areas of chronic hemorrhage. Normal pituitary and  cerebellar tonsils. Mild cervical spondylosis. Heterogenous C3 vertebral body, probably degenerative, but PET scan recommended for staging for osseous disease.  Post infusion, no abnormal enhancement of the brain or meninges. Extracranial soft tissues grossly unremarkable.  IMPRESSION: Atrophy and moderate small vessel disease. No acute intracranial findings.  No visible intracranial metastatic disease.  Heterogeneous appearance to the C3 vertebral body, likely degenerative in nature but incompletely evaluated; consider PET scan for staging.   Electronically Signed  By: Rolla Flatten M.D.   On: 09/10/2014 13:52   Nm Pet Image Initial (pi) Skull Base To Thigh  09/11/2014   CLINICAL DATA:  Initial treatment strategy for left lung small cell carcinoma.  EXAM: NUCLEAR MEDICINE PET SKULL BASE TO THIGH  TECHNIQUE: 7.2 mCi F-18 FDG was injected intravenously. Full-ring PET imaging was performed from the skull base to thigh after the radiotracer. CT data was obtained and used for attenuation correction and anatomic localization.  FASTING BLOOD GLUCOSE:  Value: 107 mg/dl  COMPARISON:  Abdomen only CT on 07/30/2014  FINDINGS: NECK  No hypermetabolic lymph nodes in the neck.  CHEST  2 cm irregular left infrahilar mass is hypermetabolic, with SUV max of 8.3. This is consistent with primary bronchogenic carcinoma. No other hypermetabolic hilar or mediastinal lymph nodes are identified.  ABDOMEN/PELVIS  No abnormal hypermetabolic activity within the liver, pancreas, adrenal glands, or spleen.  Lymphadenopathy is seen in the portacaval space measuring 2 cm, which is hypermetabolic with SUV max of 51.0. No other hypermetabolic lymphadenopathy identified within the abdomen or pelvis.  SKELETON  No focal hypermetabolic activity to suggest skeletal metastasis.  IMPRESSION: 2 cm hypermetabolic left infrahilar mass, consistent with primary bronchogenic carcinoma.  No evidence of hypermetabolic thoracic lymph nodes.  2 cm  hypermetabolic portacaval lymph node in the right abdomen, highly suspicious for metastatic disease. No other sites of hypermetabolic lymphadenopathy or distant metastatic disease identified.   Electronically Signed   By: Earle Gell M.D.   On: 09/11/2014 15:15     ASSESSMENT/PLAN:  No problem-specific assessment & plan notes found for this encounter. No problem-specific assessment & plan notes found for this encounter. Patient was discussed with and also seen by Dr. [name]. The rash is likely secondary to the oxycodone. We will discontinue this medication. For pain management, the patient was given a prescription for immediate release morphine 15 mg. He may take 1 tablet every 6 hours assay for pain, a total 30 with no refill. For the rash a prescription for Medrol Dosepak was sent to his pharmacy of record via [facility]. He will continue with weekly labs as scheduled. He will follow up in 3 weeks with a restaging CT scan of the chest, abdomen and pelvis to re-evaluate his disease.  Awilda Metro E, PA-C 10/05/2014 All questions were answered. The patient knows to call the clinic with any problems, questions or concerns. We can certainly see the patient much sooner if necessary.  ADDENDUM: Hematology/Oncology Attending: I had a face to face encounter with the patient today. I recommended his care plan. This is a very pleasant 64 years old white male recently diagnosed with extensive stage small cell lung cancer currently undergoing systemic chemotherapy with cisplatin and etoposide status post 1 cycle. He tolerated the first cycle of his treatment fairly well with no significant adverse effects. He recently developed rash especially on the right arm after starting treatment with oxycodone. We will discontinue OxyContin and start the patient on morphine sulfate 15 mg by mouth every 6 hours as needed for pain. We also given prescription for Medrol Dosepak for the skin rash. He would come back for  follow-up visit in 3 weeks with the start of cycle #3 after repeating CT scan of the chest, abdomen and pelvis for restaging of his disease. He was advised to call immediately if he has any concerning symptoms in the interval.  Disclaimer: This note was dictated with voice recognition software. Similar sounding words can inadvertently be transcribed and  may be missed upon review. Eilleen Kempf., MD 10/05/2014

## 2014-10-06 ENCOUNTER — Ambulatory Visit (HOSPITAL_BASED_OUTPATIENT_CLINIC_OR_DEPARTMENT_OTHER): Payer: Managed Care, Other (non HMO)

## 2014-10-06 ENCOUNTER — Other Ambulatory Visit: Payer: Self-pay | Admitting: Physician Assistant

## 2014-10-06 ENCOUNTER — Telehealth: Payer: Self-pay | Admitting: Internal Medicine

## 2014-10-06 ENCOUNTER — Other Ambulatory Visit: Payer: Self-pay | Admitting: *Deleted

## 2014-10-06 ENCOUNTER — Telehealth: Payer: Self-pay | Admitting: *Deleted

## 2014-10-06 DIAGNOSIS — Z5111 Encounter for antineoplastic chemotherapy: Secondary | ICD-10-CM

## 2014-10-06 DIAGNOSIS — C3492 Malignant neoplasm of unspecified part of left bronchus or lung: Secondary | ICD-10-CM

## 2014-10-06 MED ORDER — SODIUM CHLORIDE 0.9 % IV SOLN
120.0000 mg/m2 | Freq: Once | INTRAVENOUS | Status: AC
  Administered 2014-10-06: 210 mg via INTRAVENOUS
  Filled 2014-10-06: qty 10.5

## 2014-10-06 MED ORDER — DEXAMETHASONE SODIUM PHOSPHATE 10 MG/ML IJ SOLN
10.0000 mg | Freq: Once | INTRAMUSCULAR | Status: AC
  Administered 2014-10-06: 10 mg via INTRAVENOUS

## 2014-10-06 MED ORDER — DEXAMETHASONE SODIUM PHOSPHATE 10 MG/ML IJ SOLN
INTRAMUSCULAR | Status: AC
  Filled 2014-10-06: qty 1

## 2014-10-06 MED ORDER — SODIUM CHLORIDE 0.9 % IV SOLN
Freq: Once | INTRAVENOUS | Status: AC
  Administered 2014-10-06: 09:00:00 via INTRAVENOUS

## 2014-10-06 NOTE — Telephone Encounter (Signed)
Per staff message I have adjusted 12/28 appt

## 2014-10-06 NOTE — Telephone Encounter (Signed)
s.w pt and advised on Dec appt......pt ok and aware °

## 2014-10-06 NOTE — Patient Instructions (Signed)
[facility] Discharge Instructions for Patients Receiving Chemotherapy  Today you received the following chemotherapy agents: etoposide  To help prevent nausea and vomiting after your treatment, we encourage you to take your nausea medication.  Take it as often as prescribed.     If you develop nausea and vomiting that is not controlled by your nausea medication, call the clinic. If it is after clinic hours your family physician or the after hours number for the clinic or go to the Emergency Department.   BELOW ARE SYMPTOMS THAT SHOULD BE REPORTED IMMEDIATELY:  *FEVER GREATER THAN 100.5 F  *CHILLS WITH OR WITHOUT FEVER  NAUSEA AND VOMITING THAT IS NOT CONTROLLED WITH YOUR NAUSEA MEDICATION  *UNUSUAL SHORTNESS OF BREATH  *UNUSUAL BRUISING OR BLEEDING  TENDERNESS IN MOUTH AND THROAT WITH OR WITHOUT PRESENCE OF ULCERS  *URINARY PROBLEMS  *BOWEL PROBLEMS  UNUSUAL RASH Items with * indicate a potential emergency and should be followed up as soon as possible.  Feel free to call the clinic you have any questions or concerns. The clinic phone number is [phone].   I have been informed and understand all the instructions given to me. I know to contact the clinic, my physician, or go to the Emergency Department if any problems should occur. I do not have any questions at this time, but understand that I may call the clinic during office hours   should I have any questions or need assistance in obtaining follow up care.    __________________________________________  _____________  __________ Signature of Patient or Authorized Representative            Date                   Time    __________________________________________ Nurse's Signature

## 2014-10-06 NOTE — Telephone Encounter (Signed)
s.w. pt and advised on Dec appt....pt will pick up new sched tomorrow and barium

## 2014-10-07 ENCOUNTER — Ambulatory Visit (HOSPITAL_BASED_OUTPATIENT_CLINIC_OR_DEPARTMENT_OTHER): Payer: Managed Care, Other (non HMO)

## 2014-10-07 ENCOUNTER — Other Ambulatory Visit: Payer: Self-pay | Admitting: *Deleted

## 2014-10-07 DIAGNOSIS — C3492 Malignant neoplasm of unspecified part of left bronchus or lung: Secondary | ICD-10-CM

## 2014-10-07 DIAGNOSIS — Z5111 Encounter for antineoplastic chemotherapy: Secondary | ICD-10-CM

## 2014-10-07 MED ORDER — SODIUM CHLORIDE 0.9 % IV SOLN
Freq: Once | INTRAVENOUS | Status: AC
  Administered 2014-10-07: 10:00:00 via INTRAVENOUS

## 2014-10-07 MED ORDER — NICOTINE 21 MG/24HR TD PT24: 21.0000 mg | MEDICATED_PATCH | Freq: Every day | TRANSDERMAL | Status: DC

## 2014-10-07 MED ORDER — DEXAMETHASONE SODIUM PHOSPHATE 10 MG/ML IJ SOLN
INTRAMUSCULAR | Status: AC
  Filled 2014-10-07: qty 1

## 2014-10-07 MED ORDER — ETOPOSIDE CHEMO INJECTION 1 GM/50ML
120.0000 mg/m2 | Freq: Once | INTRAVENOUS | Status: AC
  Administered 2014-10-07: 210 mg via INTRAVENOUS
  Filled 2014-10-07: qty 10.5

## 2014-10-07 MED ORDER — DEXAMETHASONE SODIUM PHOSPHATE 10 MG/ML IJ SOLN
10.0000 mg | Freq: Once | INTRAMUSCULAR | Status: AC
  Administered 2014-10-07: 10 mg via INTRAVENOUS

## 2014-10-07 NOTE — Patient Instructions (Signed)
[facility] Discharge Instructions for Patients Receiving Chemotherapy  Today you received the following chemotherapy agents: etoposide  To help prevent nausea and vomiting after your treatment, we encourage you to take your nausea medication.  Take it as often as prescribed.     If you develop nausea and vomiting that is not controlled by your nausea medication, call the clinic. If it is after clinic hours your family physician or the after hours number for the clinic or go to the Emergency Department.   BELOW ARE SYMPTOMS THAT SHOULD BE REPORTED IMMEDIATELY:  *FEVER GREATER THAN 100.5 F  *CHILLS WITH OR WITHOUT FEVER  NAUSEA AND VOMITING THAT IS NOT CONTROLLED WITH YOUR NAUSEA MEDICATION  *UNUSUAL SHORTNESS OF BREATH  *UNUSUAL BRUISING OR BLEEDING  TENDERNESS IN MOUTH AND THROAT WITH OR WITHOUT PRESENCE OF ULCERS  *URINARY PROBLEMS  *BOWEL PROBLEMS  UNUSUAL RASH Items with * indicate a potential emergency and should be followed up as soon as possible.  Feel free to call the clinic you have any questions or concerns. The clinic phone number is [phone].   I have been informed and understand all the instructions given to me. I know to contact the clinic, my physician, or go to the Emergency Department if any problems should occur. I do not have any questions at this time, but understand that I may call the clinic during office hours   should I have any questions or need assistance in obtaining follow up care.    __________________________________________  _____________  __________ Signature of Patient or Authorized Representative            Date                   Time    __________________________________________ Nurse's Signature

## 2014-10-08 ENCOUNTER — Ambulatory Visit (HOSPITAL_BASED_OUTPATIENT_CLINIC_OR_DEPARTMENT_OTHER): Payer: Managed Care, Other (non HMO)

## 2014-10-08 DIAGNOSIS — C3492 Malignant neoplasm of unspecified part of left bronchus or lung: Secondary | ICD-10-CM

## 2014-10-08 DIAGNOSIS — Z5189 Encounter for other specified aftercare: Secondary | ICD-10-CM

## 2014-10-08 DIAGNOSIS — C7A1 Malignant poorly differentiated neuroendocrine tumors: Secondary | ICD-10-CM

## 2014-10-08 MED ORDER — PEGFILGRASTIM INJECTION 6 MG/0.6ML ~~LOC~~
6.0000 mg | PREFILLED_SYRINGE | Freq: Once | SUBCUTANEOUS | Status: AC
  Administered 2014-10-08: 6 mg via SUBCUTANEOUS
  Filled 2014-10-08: qty 0.6

## 2014-10-08 NOTE — Patient Instructions (Signed)
Pegfilgrastim injection What is this medicine? PEGFILGRASTIM (peg fil GRA stim) is a long-acting granulocyte colony-stimulating factor that stimulates the growth of neutrophils, a type of white blood cell important in the body's fight against infection. It is used to reduce the incidence of fever and infection in patients with certain types of cancer who are receiving chemotherapy that affects the bone marrow. This medicine may be used for other purposes; ask your health care provider or pharmacist if you have questions. COMMON BRAND NAME(S): Neulasta What should I tell my health care provider before I take this medicine? They need to know if you have any of these conditions: -latex allergy -ongoing radiation therapy -sickle cell disease -skin reactions to acrylic adhesives (On-Body Injector only) -an unusual or allergic reaction to pegfilgrastim, filgrastim, other medicines, foods, dyes, or preservatives -pregnant or trying to get pregnant -breast-feeding How should I use this medicine? This medicine is for injection under the skin. If you get this medicine at home, you will be taught how to prepare and give the pre-filled syringe or how to use the On-body Injector. Refer to the patient Instructions for Use for detailed instructions. Use exactly as directed. Take your medicine at regular intervals. Do not take your medicine more often than directed. It is important that you put your used needles and syringes in a special sharps container. Do not put them in a trash can. If you do not have a sharps container, call your pharmacist or healthcare provider to get one. Talk to your pediatrician regarding the use of this medicine in children. Special care may be needed. Overdosage: If you think you have taken too much of this medicine contact a poison control center or emergency room at once. NOTE: This medicine is only for you. Do not share this medicine with others. What if I miss a dose? It is  important not to miss your dose. Call your doctor or health care professional if you miss your dose. If you miss a dose due to an On-body Injector failure or leakage, a new dose should be administered as soon as possible using a single prefilled syringe for manual use. What may interact with this medicine? Interactions have not been studied. Give your health care provider a list of all the medicines, herbs, non-prescription drugs, or dietary supplements you use. Also tell them if you smoke, drink alcohol, or use illegal drugs. Some items may interact with your medicine. This list may not describe all possible interactions. Give your health care provider a list of all the medicines, herbs, non-prescription drugs, or dietary supplements you use. Also tell them if you smoke, drink alcohol, or use illegal drugs. Some items may interact with your medicine. What should I watch for while using this medicine? You may need blood work done while you are taking this medicine. If you are going to need a MRI, CT scan, or other procedure, tell your doctor that you are using this medicine (On-Body Injector only). What side effects may I notice from receiving this medicine? Side effects that you should report to your doctor or health care professional as soon as possible: -allergic reactions like skin rash, itching or hives, swelling of the face, lips, or tongue -dizziness -fever -pain, redness, or irritation at site where injected -pinpoint red spots on the skin -shortness of breath or breathing problems -stomach or side pain, or pain at the shoulder -swelling -tiredness -trouble passing urine Side effects that usually do not require medical attention (report to your doctor   or health care professional if they continue or are bothersome): -bone pain -muscle pain This list may not describe all possible side effects. Call your doctor for medical advice about side effects. You may report side effects to FDA at  1-800-FDA-1088. Where should I keep my medicine? Keep out of the reach of children. Store pre-filled syringes in a refrigerator between 2 and 8 degrees C (36 and 46 degrees F). Do not freeze. Keep in carton to protect from light. Throw away this medicine if it is left out of the refrigerator for more than 48 hours. Throw away any unused medicine after the expiration date. NOTE: This sheet is a summary. It may not cover all possible information. If you have questions about this medicine, talk to your doctor, pharmacist, or health care provider.  2015, [employer]vier/Gold Standard. (2014-01-15 16:14:05)  

## 2014-10-12 ENCOUNTER — Other Ambulatory Visit (HOSPITAL_BASED_OUTPATIENT_CLINIC_OR_DEPARTMENT_OTHER): Payer: Managed Care, Other (non HMO)

## 2014-10-12 DIAGNOSIS — C7A1 Malignant poorly differentiated neuroendocrine tumors: Secondary | ICD-10-CM

## 2014-10-12 DIAGNOSIS — C3492 Malignant neoplasm of unspecified part of left bronchus or lung: Secondary | ICD-10-CM

## 2014-10-12 LAB — COMPREHENSIVE METABOLIC PANEL (CC13)
ALT: 17 U/L (ref 0–55)
AST: 13 U/L (ref 5–34)
Albumin: 3.9 g/dL (ref 3.5–5.0)
Alkaline Phosphatase: 143 U/L (ref 40–150)
Anion Gap: 10 meq/L (ref 3–11)
BUN: 21.9 mg/dL (ref 7.0–26.0)
CO2: 28 meq/L (ref 22–29)
Calcium: 9 mg/dL (ref 8.4–10.4)
Chloride: 91 meq/L — ABNORMAL LOW (ref 98–109)
Creatinine: 0.8 mg/dL (ref 0.7–1.3)
EGFR: >90 ml/min/1.73 m2
Glucose: 93 mg/dL (ref 70–140)
Potassium: 4.9 meq/L (ref 3.5–5.1)
Sodium: 129 meq/L — ABNORMAL LOW (ref 136–145)
Total Bilirubin: 0.69 mg/dL (ref 0.20–1.20)
Total Protein: 6.3 g/dL — ABNORMAL LOW (ref 6.4–8.3)

## 2014-10-12 LAB — CBC WITH DIFFERENTIAL/PLATELET
BASO%: 0.5 % (ref 0.0–2.0)
BASOS ABS: 0.1 10*3/uL (ref 0.0–0.1)
EOS ABS: 0 10*3/uL (ref 0.0–0.5)
EOS%: 0.2 % (ref 0.0–7.0)
HEMATOCRIT: 35.6 % — AB (ref 38.4–49.9)
HEMOGLOBIN: 12.1 g/dL — AB (ref 13.0–17.1)
LYMPH#: 1.6 10*3/uL (ref 0.9–3.3)
LYMPH%: 11.3 % — ABNORMAL LOW (ref 14.0–49.0)
MCH: 33.4 pg (ref 27.2–33.4)
MCHC: 34 g/dL (ref 32.0–36.0)
MCV: 98.2 fL — ABNORMAL HIGH (ref 79.3–98.0)
MONO#: 0.7 10*3/uL (ref 0.1–0.9)
MONO%: 5 % (ref 0.0–14.0)
NEUT%: 83 % — AB (ref 39.0–75.0)
NEUTROS ABS: 11.9 10*3/uL — AB (ref 1.5–6.5)
Platelets: 298 10*3/uL (ref 140–400)
RBC: 3.63 10*6/uL — ABNORMAL LOW (ref 4.20–5.82)
RDW: 13.1 % (ref 11.0–14.6)
WBC: 14.3 10*3/uL — AB (ref 4.0–10.3)

## 2014-10-12 LAB — MAGNESIUM (CC13): MAGNESIUM: 2.4 mg/dL (ref 1.5–2.5)

## 2014-10-13 ENCOUNTER — Other Ambulatory Visit: Payer: Self-pay | Admitting: Physician Assistant

## 2014-10-13 NOTE — Telephone Encounter (Signed)
Printed for [facility] to review request and quantity.  Next F/u is 10-26-2014.

## 2014-10-14 ENCOUNTER — Other Ambulatory Visit: Payer: Self-pay | Admitting: Physician Assistant

## 2014-10-14 MED ORDER — MORPHINE SULFATE 15 MG PO TABS: 15.0000 mg | ORAL_TABLET | Freq: Four times a day (QID) | ORAL | Status: DC | PRN

## 2014-10-19 ENCOUNTER — Ambulatory Visit (HOSPITAL_BASED_OUTPATIENT_CLINIC_OR_DEPARTMENT_OTHER): Payer: Managed Care, Other (non HMO) | Admitting: Internal Medicine

## 2014-10-19 DIAGNOSIS — C3492 Malignant neoplasm of unspecified part of left bronchus or lung: Secondary | ICD-10-CM

## 2014-10-19 LAB — CBC WITH DIFFERENTIAL/PLATELET
BASO%: 0.5 % (ref 0.0–2.0)
Basophils Absolute: 0.1 10*3/uL (ref 0.0–0.1)
EOS%: 0.3 % (ref 0.0–7.0)
Eosinophils Absolute: 0 10*3/uL (ref 0.0–0.5)
HCT: 36.1 % — ABNORMAL LOW (ref 38.4–49.9)
HGB: 12.1 g/dL — ABNORMAL LOW (ref 13.0–17.1)
LYMPH#: 1.9 10*3/uL (ref 0.9–3.3)
LYMPH%: 11.2 % — AB (ref 14.0–49.0)
MCH: 33 pg (ref 27.2–33.4)
MCHC: 33.4 g/dL (ref 32.0–36.0)
MCV: 98.9 fL — ABNORMAL HIGH (ref 79.3–98.0)
MONO#: 1.6 10*3/uL — AB (ref 0.1–0.9)
MONO%: 9.8 % (ref 0.0–14.0)
NEUT#: 13.1 10*3/uL — ABNORMAL HIGH (ref 1.5–6.5)
NEUT%: 78.2 % — ABNORMAL HIGH (ref 39.0–75.0)
Platelets: 208 10*3/uL (ref 140–400)
RBC: 3.65 10*6/uL — ABNORMAL LOW (ref 4.20–5.82)
RDW: 13.6 % (ref 11.0–14.6)
WBC: 16.7 10*3/uL — AB (ref 4.0–10.3)

## 2014-10-19 LAB — COMPREHENSIVE METABOLIC PANEL (CC13)
ALBUMIN: 3.8 g/dL (ref 3.5–5.0)
ALT: 19 U/L (ref 0–55)
AST: 16 U/L (ref 5–34)
Alkaline Phosphatase: 116 U/L (ref 40–150)
Anion Gap: 11 mEq/L (ref 3–11)
BUN: 15.3 mg/dL (ref 7.0–26.0)
CO2: 26 mEq/L (ref 22–29)
Calcium: 9.5 mg/dL (ref 8.4–10.4)
Chloride: 96 mEq/L — ABNORMAL LOW (ref 98–109)
Creatinine: 0.8 mg/dL (ref 0.7–1.3)
GLUCOSE: 88 mg/dL (ref 70–140)
POTASSIUM: 5 meq/L (ref 3.5–5.1)
SODIUM: 133 meq/L — AB (ref 136–145)
TOTAL PROTEIN: 6.7 g/dL (ref 6.4–8.3)
Total Bilirubin: 0.3 mg/dL (ref 0.20–1.20)

## 2014-10-19 LAB — MAGNESIUM (CC13): Magnesium: 2.4 mg/dl (ref 1.5–2.5)

## 2014-10-21 ENCOUNTER — Ambulatory Visit
Admission: RE | Admit: 2014-10-21 | Discharge: 2014-10-21 | Disposition: A | Discharge status: Home / Self Care | Payer: Managed Care, Other (non HMO) | Source: Ambulatory Visit | Attending: Physician Assistant | Admitting: Physician Assistant

## 2014-10-21 DIAGNOSIS — C3492 Malignant neoplasm of unspecified part of left bronchus or lung: Secondary | ICD-10-CM

## 2014-10-21 MED ORDER — IOHEXOL 300 MG/ML  SOLN
100.0000 mL | Freq: Once | INTRAMUSCULAR | Status: AC | PRN
  Administered 2014-10-21: 100 mL via INTRAVENOUS

## 2014-10-26 ENCOUNTER — Other Ambulatory Visit (HOSPITAL_BASED_OUTPATIENT_CLINIC_OR_DEPARTMENT_OTHER): Payer: Managed Care, Other (non HMO)

## 2014-10-26 ENCOUNTER — Other Ambulatory Visit: Payer: Self-pay | Admitting: Physician Assistant

## 2014-10-26 ENCOUNTER — Ambulatory Visit (HOSPITAL_BASED_OUTPATIENT_CLINIC_OR_DEPARTMENT_OTHER): Payer: Managed Care, Other (non HMO) | Admitting: Oncology

## 2014-10-26 ENCOUNTER — Ambulatory Visit (HOSPITAL_BASED_OUTPATIENT_CLINIC_OR_DEPARTMENT_OTHER): Payer: Managed Care, Other (non HMO)

## 2014-10-26 ENCOUNTER — Other Ambulatory Visit: Payer: Managed Care, Other (non HMO)

## 2014-10-26 ENCOUNTER — Encounter: Payer: Self-pay | Admitting: Oncology

## 2014-10-26 ENCOUNTER — Ambulatory Visit: Payer: Managed Care, Other (non HMO) | Admitting: Nutrition

## 2014-10-26 VITALS — BP 138/75 | HR 78 | Temp 98.5°F | Resp 18 | Ht 68.0 in | Wt 150.4 lb

## 2014-10-26 DIAGNOSIS — C3492 Malignant neoplasm of unspecified part of left bronchus or lung: Secondary | ICD-10-CM

## 2014-10-26 DIAGNOSIS — Z5111 Encounter for antineoplastic chemotherapy: Secondary | ICD-10-CM

## 2014-10-26 DIAGNOSIS — C7A1 Malignant poorly differentiated neuroendocrine tumors: Secondary | ICD-10-CM

## 2014-10-26 LAB — COMPREHENSIVE METABOLIC PANEL (CC13)
ALK PHOS: 86 U/L (ref 40–150)
ALT: 20 U/L (ref 0–55)
AST: 16 U/L (ref 5–34)
Albumin: 3.9 g/dL (ref 3.5–5.0)
Anion Gap: 9 mEq/L (ref 3–11)
BUN: 12 mg/dL (ref 7.0–26.0)
CO2: 27 mEq/L (ref 22–29)
CREATININE: 0.8 mg/dL (ref 0.7–1.3)
Calcium: 9.3 mg/dL (ref 8.4–10.4)
Chloride: 96 mEq/L — ABNORMAL LOW (ref 98–109)
EGFR: >90 mL/min/{1.73_m2} (ref >90)
Glucose: 100 mg/dl (ref 70–140)
POTASSIUM: 4.9 meq/L (ref 3.5–5.1)
Sodium: 132 mEq/L — ABNORMAL LOW (ref 136–145)
Total Bilirubin: 0.52 mg/dL (ref 0.20–1.20)
Total Protein: 6.4 g/dL (ref 6.4–8.3)

## 2014-10-26 LAB — CBC WITH DIFFERENTIAL/PLATELET
BASO%: 0.7 % (ref 0.0–2.0)
BASOS ABS: 0.1 10*3/uL (ref 0.0–0.1)
EOS%: 0.2 % (ref 0.0–7.0)
Eosinophils Absolute: 0 10*3/uL (ref 0.0–0.5)
HEMATOCRIT: 34.5 % — AB (ref 38.4–49.9)
HEMOGLOBIN: 11.7 g/dL — AB (ref 13.0–17.1)
LYMPH%: 10.9 % — AB (ref 14.0–49.0)
MCH: 33.3 pg (ref 27.2–33.4)
MCHC: 34 g/dL (ref 32.0–36.0)
MCV: 97.8 fL (ref 79.3–98.0)
MONO#: 1.6 10*3/uL — ABNORMAL HIGH (ref 0.1–0.9)
MONO%: 10.6 % (ref 0.0–14.0)
NEUT#: 11.4 10*3/uL — ABNORMAL HIGH (ref 1.5–6.5)
NEUT%: 77.6 % — AB (ref 39.0–75.0)
PLATELETS: 377 10*3/uL (ref 140–400)
RBC: 3.53 10*6/uL — ABNORMAL LOW (ref 4.20–5.82)
RDW: 13.7 % (ref 11.0–14.6)
WBC: 14.7 10*3/uL — AB (ref 4.0–10.3)
lymph#: 1.6 10*3/uL (ref 0.9–3.3)

## 2014-10-26 LAB — MAGNESIUM (CC13): Magnesium: 2.3 mg/dl (ref 1.5–2.5)

## 2014-10-26 MED ORDER — POTASSIUM CHLORIDE 2 MEQ/ML IV SOLN
Freq: Once | INTRAVENOUS | Status: AC
  Administered 2014-10-26: 14:00:00 via INTRAVENOUS
  Filled 2014-10-26: qty 10

## 2014-10-26 MED ORDER — SODIUM CHLORIDE 0.9 % IV SOLN
Freq: Once | INTRAVENOUS | Status: AC
  Administered 2014-10-26: 13:00:00 via INTRAVENOUS

## 2014-10-26 MED ORDER — PALONOSETRON HCL INJECTION 0.25 MG/5ML
0.2500 mg | Freq: Once | INTRAVENOUS | Status: AC
  Administered 2014-10-26: 0.25 mg via INTRAVENOUS

## 2014-10-26 MED ORDER — PALONOSETRON HCL INJECTION 0.25 MG/5ML
INTRAVENOUS | Status: AC
  Filled 2014-10-26: qty 5

## 2014-10-26 MED ORDER — SODIUM CHLORIDE 0.9 % IV SOLN
60.0000 mg/m2 | Freq: Once | INTRAVENOUS | Status: AC
  Administered 2014-10-26: 107 mg via INTRAVENOUS
  Filled 2014-10-26: qty 107

## 2014-10-26 MED ORDER — DEXAMETHASONE SODIUM PHOSPHATE 20 MG/5ML IJ SOLN
INTRAMUSCULAR | Status: AC
  Filled 2014-10-26: qty 5

## 2014-10-26 MED ORDER — SODIUM CHLORIDE 0.9 % IV SOLN
120.0000 mg/m2 | Freq: Once | INTRAVENOUS | Status: AC
  Administered 2014-10-26: 210 mg via INTRAVENOUS
  Filled 2014-10-26: qty 10.5

## 2014-10-26 MED ORDER — DEXAMETHASONE SODIUM PHOSPHATE 20 MG/5ML IJ SOLN
12.0000 mg | Freq: Once | INTRAMUSCULAR | Status: AC
  Administered 2014-10-26: 12 mg via INTRAVENOUS

## 2014-10-26 MED ORDER — SODIUM CHLORIDE 0.9 % IV SOLN
150.0000 mg | Freq: Once | INTRAVENOUS | Status: AC
  Administered 2014-10-26: 150 mg via INTRAVENOUS
  Filled 2014-10-26: qty 5

## 2014-10-26 NOTE — Progress Notes (Signed)
No images are attached to the encounter. No scans are attached to the encounter. No scans are attached to the encounter. [facility] VISIT PROGRESS NOTE  [name], [place] [address]  DIAGNOSIS: Small cell lung cancer   Staging form: Lung, AJCC 7th Edition     Clinical: T2, N1, M0 - Unsigned Extensive Stage  PRIOR THERAPY: none  CURRENT THERAPY: Systemic chemotherapy with cisplatin 60 MG/M2 on day 1 and etoposide 120 MG/M2 on days 1, 2 and 3 with Neulasta support on day 4. Status post 2 cycles.  INTERVAL HISTORY: Spencer Long 63 y.o. male returns for a scheduled regular symptom management visit for followup of his recently diagnosed small cell lung cancer. Overall he continues to tolerate his chemotherapy with cisplatin and etoposide with Neulasta support relatively well. He denied any issues with shortness of breath, cough, hemoptysis, fever, chills, nausea or vomiting. He is drinking at least 64 ounces of fluids daily is sleeping well and has a good appetite. He presents to proceed with cycle #3.  MEDICAL HISTORY: Past Medical History  Diagnosis Date  . Hypertension     under control, has been on med. x 15-20 yrs.  [name] GERD (gastroesophageal reflux disease)   . Infection of elbow     right-no infection at present , but has skin lesions present  . Sarcoidosis     "tends to have skin flareups" and presently experiencing now"feet, legs and arms"  . Chronic hyponatremia     Dr. Buddy Duty follows  . Lung cancer     ALLERGIES:  is allergic to varenicline; isothiazolinone chloride; oxycodone; and quaternium-15.  MEDICATIONS:  Current Outpatient Prescriptions  Medication Sig Dispense Refill  . amLODipine (NORVASC) 10 MG tablet Take 10 mg by mouth every morning.     [name] aspirin EC 81 MG tablet Take 81 mg by mouth every morning.     [name] atenolol (TENORMIN) 25 MG tablet Take 25 mg by mouth 2 (two) times daily.      .  Azilsartan Medoxomil (EDARBI) 80 MG TABS Take 1 tablet by mouth every morning.    [name] buPROPion (WELLBUTRIN SR) 150 MG 12 hr tablet Take 150mg  daily for one week, then take 150mg  by mouth twice a day 60 tablet 2  . clobetasol cream (TEMOVATE) 5.40 % Apply 1 application topically 2 (two) times daily as needed.    . dicyclomine (BENTYL) 20 MG tablet Take 20 mg by mouth every 4 (four) hours as needed for spasms.     . folic acid (FOLVITE) 1 MG tablet Take 1 mg by mouth 2 (two) times daily.    [name] morphine (MSIR) 15 MG tablet Take 1 tablet (15 mg total) by mouth every 6 (six) hours as needed for severe pain. 30 tablet 0  . Multiple Vitamins-Minerals (MULTIVITAMIN ADULTS 50+ PO) Take 1 tablet by mouth every morning.     . nicotine (NICODERM CQ - DOSED IN MG/24 HOURS) 21 mg/24hr patch Place 1 patch (21 mg total) onto the skin daily. 28 patch 0  . omeprazole (PRILOSEC) 40 MG capsule Take 40 mg by mouth every morning.    . ondansetron (ZOFRAN) 8 MG tablet Take 1 tablet (8 mg total) by mouth every 8 (eight) hours as needed for nausea or vomiting (start on 3rd day after chemo). 20 tablet 1  . Probiotic Product (PROBIOTIC DAILY PO) Take 1 tablet by mouth every morning.     . prochlorperazine (COMPAZINE)  10 MG tablet Take 1 tablet (10 mg total) by mouth every 6 (six) hours as needed for nausea. 60 tablet 1  . sodium chloride 1 G tablet Take 1 g by mouth 3 (three) times daily.    . white petrolatum (VASELINE) GEL Apply 1 application topically daily as needed for lip care or dry skin.     No current facility-administered medications for this visit.   Facility-Administered Medications Ordered in Other Visits  Medication Dose Route Frequency Provider Last Rate Last Dose  . etoposide (VEPESID) 210 mg in sodium chloride 0.9 % 600 mL chemo infusion  120 mg/m2 (Treatment Plan Actual) Intravenous Once Curt Bears, MD 611 mL/hr at 10/26/14 1505 210 mg at 10/26/14 1505    SURGICAL HISTORY:  Past Surgical History   Procedure Laterality Date  . Cholecystectomy  05/2006  . Ulnar nerve repair  01/2008    right; decompression  . Carpal tunnel release  01/2008    right  . Eye surgery  12/2008    correction ectropion, release band, wedge exc., shortening  . I&d extremity  09/12/2011    Procedure: IRRIGATION AND DEBRIDEMENT EXTREMITY;  Surgeon: Tennis Must;  Location: [facility];  Service: Orthopedics;  Laterality: Right;  I&d RIGHT OLECRANON BURSSA   . Tonsillectomy    . Endobronchial ultrasound Bilateral 08/24/2014    Procedure: ENDOBRONCHIAL ULTRASOUND;  Surgeon: Collene Gobble, MD;  Location: [facility];  Service: Cardiopulmonary;  Laterality: Bilateral;    REVIEW OF SYSTEMS:  Review of Systems  Constitutional: Negative.   HENT: Negative.   Eyes: Negative.   Respiratory: Negative.   Cardiovascular: Negative.   Gastrointestinal: Negative.   Genitourinary: Negative.   Musculoskeletal: Negative.   Skin: Negative.   Neurological: Negative.   Endo/Heme/Allergies: Negative.   Psychiatric/Behavioral: Negative.      PHYSICAL EXAMINATION: Physical Exam  Constitutional: He is oriented to person, place, and time and well-developed, well-nourished, and in no distress.  HENT:  Head: Normocephalic and atraumatic.  Mouth/Throat: Oropharynx is clear and moist.  Eyes: Conjunctivae and EOM are normal. Pupils are equal, round, and reactive to light.  Neck: Normal range of motion. Neck supple.  Cardiovascular: Normal rate, regular rhythm, normal heart sounds and intact distal pulses.   Pulmonary/Chest: Effort normal and breath sounds normal.  Abdominal: Soft. Bowel sounds are normal.  Musculoskeletal: Normal range of motion.  Neurological: He is alert and oriented to person, place, and time. Gait normal.  Skin: Skin is warm and dry.  Psychiatric: Mood, memory, affect and judgment normal.  Vitals reviewed.   ECOG PERFORMANCE STATUS: 1 - Symptomatic but completely ambulatory  Blood  pressure 138/75, pulse 78, temperature 98.5 F (36.9 C), temperature source Oral, resp. rate 18, height 5\' 8"  (1.727 m), weight 150 lb 6.4 oz (68.221 kg), SpO2 100 %.  LABORATORY DATA: Lab Results  Component Value Date   WBC 14.7* 10/26/2014   HGB 11.7* 10/26/2014   HCT 34.5* 10/26/2014   MCV 97.8 10/26/2014   PLT 377 10/26/2014      Chemistry      Component Value Date/Time   NA 132* 10/26/2014 1018   NA 125* 09/12/2011 1302   K 4.9 10/26/2014 1018   K 5.5* 09/12/2011 1302   CL 95* 09/12/2011 1302   CO2 27 10/26/2014 1018   CO2 26 12/25/2008 1425   BUN 12.0 10/26/2014 1018   BUN 19 09/12/2011 1302   CREATININE 0.8 10/26/2014 1018   CREATININE 1.00 09/12/2011 1302  Component Value Date/Time   CALCIUM 9.3 10/26/2014 1018   CALCIUM 9.1 12/25/2008 1425   ALKPHOS 86 10/26/2014 1018   AST 16 10/26/2014 1018   ALT 20 10/26/2014 1018   BILITOT 0.52 10/26/2014 1018       RADIOGRAPHIC STUDIES:  Ct Chest W Contrast  10/21/2014   CLINICAL DATA:  restaging extensive stage small cell lung cancer  Comments: 147ml omni 300, small cell lt lung ca 11'15, Chemo continues, w/o chest or abd complaint, ex-smoker x 1 month, cholecystectomy//a.c.  EXAM: CT CHEST, ABDOMEN, AND PELVIS WITH CONTRAST  TECHNIQUE: Multidetector CT imaging of the chest, abdomen and pelvis was performed following the standard protocol during bolus administration of intravenous contrast.  CONTRAST:  118mL OMNIPAQUE IOHEXOL 300 MG/ML  SOLN  COMPARISON:  PET-CT, 09/11/2014. Abdomen pelvis CT 09/28/2011 and abdomen CT, 07/30/2014.  FINDINGS: CT CHEST FINDINGS  Small soft tissue mass adjacent to the central left lower lobe bronchovascular structures in the infrahilar region on the left, now measures 13 mm x 10 mm, on the prior PET-CT measuring approximately 15 mm x 14 mm. No mediastinal masses or adenopathy. No neck base or axillary masses or adenopathy. No right hilar mass or adenopathy.  Peribronchial opacities noted in  the left lower lobe, which may all be atelectasis. Metastatic disease is felt unlikely. It could be inflammatory/ infectious in etiology. Atelectasis is favored.  Mild centrilobular emphysema is noted in the upper lobes. There are few areas of minor reticular scarring. No discrete lung masses or nodules. No pleural effusions.  CT ABDOMEN AND PELVIS FINDINGS  Portal caval node in the porta hepatis, posterior to the portal vein, has decreased in size the prior study, currently 8.5 mm in short axis by 15 mm in long axis, previously 2 cm in short axis by 2.8 cm in long axis.  Liver is unremarkable. Normal spleen and pancreas. No bile duct dilation. Gallbladder surgically absent. No adrenal masses.  Kidneys show mild cortical thinning. Right intrarenal stones noted previously are stable. No hydronephrosis. Ureters and bladder are unremarkable.  No other adenopathy.  Mild to moderate increased stool burden in the colon. Numerous left colon diverticula. No diverticulitis or colonic inflammation.  Normal small bowel.  Normal appendix.  Dense atherosclerotic change noted along the abdominal aorta and its branch vessels. This causes apparent significant narrowing of the proximal left renal artery. These findings are unchanged.  MUSCULOSKELETAL  No osteoblastic or osteolytic lesions.  IMPRESSION: 1. Since the prior PET-CT, there has been a positive response to therapy. Left infrahilar mass has decreased in size as has the portal caval lymph node. There are no new abnormalities to suggest new metastatic disease. 2. Left lower lobe opacity is similar to the prior PET-CT. This is most likely atelectasis and is not suspicious for metastatic disease.   Electronically Signed   By: Lajean Manes M.D.   On: 10/21/2014 10:18   Ct Abdomen Pelvis W Contrast  10/21/2014   CLINICAL DATA:  restaging extensive stage small cell lung cancer  Comments: 167ml omni 300, small cell lt lung ca 11'15, Chemo continues, w/o chest or abd complaint,  ex-smoker x 1 month, cholecystectomy//a.c.  EXAM: CT CHEST, ABDOMEN, AND PELVIS WITH CONTRAST  TECHNIQUE: Multidetector CT imaging of the chest, abdomen and pelvis was performed following the standard protocol during bolus administration of intravenous contrast.  CONTRAST:  15mL OMNIPAQUE IOHEXOL 300 MG/ML  SOLN  COMPARISON:  PET-CT, 09/11/2014. Abdomen pelvis CT 09/28/2011 and abdomen CT, 07/30/2014.  FINDINGS: CT CHEST  FINDINGS  Small soft tissue mass adjacent to the central left lower lobe bronchovascular structures in the infrahilar region on the left, now measures 13 mm x 10 mm, on the prior PET-CT measuring approximately 15 mm x 14 mm. No mediastinal masses or adenopathy. No neck base or axillary masses or adenopathy. No right hilar mass or adenopathy.  Peribronchial opacities noted in the left lower lobe, which may all be atelectasis. Metastatic disease is felt unlikely. It could be inflammatory/ infectious in etiology. Atelectasis is favored.  Mild centrilobular emphysema is noted in the upper lobes. There are few areas of minor reticular scarring. No discrete lung masses or nodules. No pleural effusions.  CT ABDOMEN AND PELVIS FINDINGS  Portal caval node in the porta hepatis, posterior to the portal vein, has decreased in size the prior study, currently 8.5 mm in short axis by 15 mm in long axis, previously 2 cm in short axis by 2.8 cm in long axis.  Liver is unremarkable. Normal spleen and pancreas. No bile duct dilation. Gallbladder surgically absent. No adrenal masses.  Kidneys show mild cortical thinning. Right intrarenal stones noted previously are stable. No hydronephrosis. Ureters and bladder are unremarkable.  No other adenopathy.  Mild to moderate increased stool burden in the colon. Numerous left colon diverticula. No diverticulitis or colonic inflammation.  Normal small bowel.  Normal appendix.  Dense atherosclerotic change noted along the abdominal aorta and its branch vessels. This causes  apparent significant narrowing of the proximal left renal artery. These findings are unchanged.  MUSCULOSKELETAL  No osteoblastic or osteolytic lesions.  IMPRESSION: 1. Since the prior PET-CT, there has been a positive response to therapy. Left infrahilar mass has decreased in size as has the portal caval lymph node. There are no new abnormalities to suggest new metastatic disease. 2. Left lower lobe opacity is similar to the prior PET-CT. This is most likely atelectasis and is not suspicious for metastatic disease.   Electronically Signed   By: Lajean Manes M.D.   On: 10/21/2014 10:18     ASSESSMENT/PLAN:  No problem-specific assessment & plan notes found for this encounter. No problem-specific assessment & plan notes found for this encounter. Patient was seen and examined with Dr. [name]. CT scan results reviewed with patient and wife. He is responding well to chemotherapy. Recommend that he proceed with cycle 3 of cisplatin and etoposide on days 1, 2, and 3 with Neulasta support on day 4. He will continue with weekly labs as scheduled. Plan is to restage again after 4 cycles of chemotherapy. He will follow up in 3 weeks for cycle 4 of his chemotherapy.  All questions were answered. The patient knows to call the clinic with any problems, questions or concerns. We can certainly see the patient much sooner if necessary.  Mikey Bussing, NP 10/26/2014  ADDENDUM: Hematology/Oncology Attending: I had a face to face encounter with the patient today. I recommended his care plan. This is a very pleasant 63 years old white male with extensive stage small cell lung cancer who is currently undergoing systemic chemotherapy with cisplatin and etoposide status post 2 cycles. He tolerated the first 2 cycles of his treatment fairly well with no significant adverse effects. Repeat CT scan of the chest, abdomen and pelvis showed significant improvement in his disease. I discussed the scan results with the  patient and his wife today. I recommended for him to continue his current treatment with cisplatin and etoposide as a scheduled. He will start cycle #3 today.  The patient would come back for follow-up visit in 3 weeks with the start of cycle #4. He was advised to call immediately if he has any concerning symptoms in the interval.  Disclaimer: This note was dictated with voice recognition software. Similar sounding words can inadvertently be transcribed and may be missed upon review. Eilleen Kempf., MD 10/26/2014

## 2014-10-26 NOTE — Progress Notes (Signed)
Nutrition follow-up completed with patient and wife in chemotherapy.  Patient is being treated small cell lung cancer.  Patient reports he is eating well. He denies nausea vomiting or other nutrition issues. Weight increased slightly and documented as 150.4 pounds December 28, up from 149.2 pounds December 7. Patient states he has anemia.  Nutrition diagnosis: Food and nutrition related knowledge deficit continues but improved.  Intervention: Educated patient and wife on high iron foods and strategies to increase absorption.  Provided fact sheet on anemia. Patient educated to continue oral nutrition supplements and intake to minimize weight loss. Questions were answered.  Teach back method used.  Monitoring, evaluation, goals: Patient has achieved weight stabilization with increased oral intake.  Next visit: Will be scheduled as needed.  **Disclaimer: This note was dictated with voice recognition software. Similar sounding words can inadvertently be transcribed and this note may contain transcription errors which may not have been corrected upon publication of note.**

## 2014-10-26 NOTE — Patient Instructions (Signed)
[facility] Discharge Instructions for Patients Receiving Chemotherapy  Today you received the following chemotherapy agents cisplatin and etoposide  To help prevent nausea and vomiting after your treatment, we encourage you to take your nausea medication as directed by MD   If you develop nausea and vomiting that is not controlled by your nausea medication, call the clinic.   BELOW ARE SYMPTOMS THAT SHOULD BE REPORTED IMMEDIATELY:  *FEVER GREATER THAN 100.5 F  *CHILLS WITH OR WITHOUT FEVER  NAUSEA AND VOMITING THAT IS NOT CONTROLLED WITH YOUR NAUSEA MEDICATION  *UNUSUAL SHORTNESS OF BREATH  *UNUSUAL BRUISING OR BLEEDING  TENDERNESS IN MOUTH AND THROAT WITH OR WITHOUT PRESENCE OF ULCERS  *URINARY PROBLEMS  *BOWEL PROBLEMS  UNUSUAL RASH Items with * indicate a potential emergency and should be followed up as soon as possible.  Feel free to call the clinic you have any questions or concerns. The clinic phone number is [phone].

## 2014-10-27 ENCOUNTER — Telehealth: Payer: Self-pay | Admitting: Internal Medicine

## 2014-10-27 ENCOUNTER — Ambulatory Visit (HOSPITAL_BASED_OUTPATIENT_CLINIC_OR_DEPARTMENT_OTHER): Payer: Managed Care, Other (non HMO)

## 2014-10-27 DIAGNOSIS — C3492 Malignant neoplasm of unspecified part of left bronchus or lung: Secondary | ICD-10-CM

## 2014-10-27 DIAGNOSIS — Z5111 Encounter for antineoplastic chemotherapy: Secondary | ICD-10-CM

## 2014-10-27 DIAGNOSIS — C7A1 Malignant poorly differentiated neuroendocrine tumors: Secondary | ICD-10-CM

## 2014-10-27 MED ORDER — DEXAMETHASONE SODIUM PHOSPHATE 10 MG/ML IJ SOLN
INTRAMUSCULAR | Status: AC
  Filled 2014-10-27: qty 1

## 2014-10-27 MED ORDER — DEXAMETHASONE SODIUM PHOSPHATE 10 MG/ML IJ SOLN
10.0000 mg | Freq: Once | INTRAMUSCULAR | Status: AC
  Administered 2014-10-27: 10 mg via INTRAVENOUS

## 2014-10-27 MED ORDER — SODIUM CHLORIDE 0.9 % IV SOLN
120.0000 mg/m2 | Freq: Once | INTRAVENOUS | Status: AC
  Administered 2014-10-27: 210 mg via INTRAVENOUS
  Filled 2014-10-27: qty 10.5

## 2014-10-27 MED ORDER — SODIUM CHLORIDE 0.9 % IV SOLN
Freq: Once | INTRAVENOUS | Status: AC
  Administered 2014-10-27: 10:00:00 via INTRAVENOUS

## 2014-10-27 NOTE — Patient Instructions (Signed)
[facility] Discharge Instructions for Patients Receiving Chemotherapy  Today you received the following chemotherapy agents: Etoposide.  To help prevent nausea and vomiting after your treatment, we encourage you to take your nausea medication as prescribed.   If you develop nausea and vomiting that is not controlled by your nausea medication, call the clinic.   BELOW ARE SYMPTOMS THAT SHOULD BE REPORTED IMMEDIATELY:  *FEVER GREATER THAN 100.5 F  *CHILLS WITH OR WITHOUT FEVER  NAUSEA AND VOMITING THAT IS NOT CONTROLLED WITH YOUR NAUSEA MEDICATION  *UNUSUAL SHORTNESS OF BREATH  *UNUSUAL BRUISING OR BLEEDING  TENDERNESS IN MOUTH AND THROAT WITH OR WITHOUT PRESENCE OF ULCERS  *URINARY PROBLEMS  *BOWEL PROBLEMS  UNUSUAL RASH Items with * indicate a potential emergency and should be followed up as soon as possible.  Feel free to call the clinic you have any questions or concerns. The clinic phone number is [phone].

## 2014-10-27 NOTE — Telephone Encounter (Signed)
gv and rpinted appt sched and avs for Dec and Jan 2016

## 2014-10-28 ENCOUNTER — Ambulatory Visit (HOSPITAL_BASED_OUTPATIENT_CLINIC_OR_DEPARTMENT_OTHER): Payer: Managed Care, Other (non HMO)

## 2014-10-28 DIAGNOSIS — Z5111 Encounter for antineoplastic chemotherapy: Secondary | ICD-10-CM

## 2014-10-28 DIAGNOSIS — C7A1 Malignant poorly differentiated neuroendocrine tumors: Secondary | ICD-10-CM

## 2014-10-28 DIAGNOSIS — C3492 Malignant neoplasm of unspecified part of left bronchus or lung: Secondary | ICD-10-CM

## 2014-10-28 MED ORDER — DEXAMETHASONE SODIUM PHOSPHATE 10 MG/ML IJ SOLN
INTRAMUSCULAR | Status: AC
  Filled 2014-10-28: qty 1

## 2014-10-28 MED ORDER — DEXAMETHASONE SODIUM PHOSPHATE 10 MG/ML IJ SOLN
10.0000 mg | Freq: Once | INTRAMUSCULAR | Status: AC
  Administered 2014-10-28: 10 mg via INTRAVENOUS

## 2014-10-28 MED ORDER — ETOPOSIDE CHEMO INJECTION 1 GM/50ML
120.0000 mg/m2 | Freq: Once | INTRAVENOUS | Status: AC
  Administered 2014-10-28: 210 mg via INTRAVENOUS
  Filled 2014-10-28: qty 10.5

## 2014-10-28 MED ORDER — SODIUM CHLORIDE 0.9 % IV SOLN
Freq: Once | INTRAVENOUS | Status: AC
  Administered 2014-10-28: 10:00:00 via INTRAVENOUS

## 2014-10-28 NOTE — Patient Instructions (Signed)
[facility] Discharge Instructions for Patients Receiving Chemotherapy  Today you received the following chemotherapy agents; Etoposide (VP-6)  To help prevent nausea and vomiting after your treatment, we encourage you to take your nausea medication as directed.    If you develop nausea and vomiting that is not controlled by your nausea medication, call the clinic.   BELOW ARE SYMPTOMS THAT SHOULD BE REPORTED IMMEDIATELY:  *FEVER GREATER THAN 100.5 F  *CHILLS WITH OR WITHOUT FEVER  NAUSEA AND VOMITING THAT IS NOT CONTROLLED WITH YOUR NAUSEA MEDICATION  *UNUSUAL SHORTNESS OF BREATH  *UNUSUAL BRUISING OR BLEEDING  TENDERNESS IN MOUTH AND THROAT WITH OR WITHOUT PRESENCE OF ULCERS  *URINARY PROBLEMS  *BOWEL PROBLEMS  UNUSUAL RASH Items with * indicate a potential emergency and should be followed up as soon as possible.  Feel free to call the clinic you have any questions or concerns. The clinic phone number is [phone].

## 2014-10-29 ENCOUNTER — Ambulatory Visit (HOSPITAL_BASED_OUTPATIENT_CLINIC_OR_DEPARTMENT_OTHER): Payer: Managed Care, Other (non HMO)

## 2014-10-29 DIAGNOSIS — C7A1 Malignant poorly differentiated neuroendocrine tumors: Secondary | ICD-10-CM

## 2014-10-29 DIAGNOSIS — C3492 Malignant neoplasm of unspecified part of left bronchus or lung: Secondary | ICD-10-CM

## 2014-10-29 DIAGNOSIS — Z5189 Encounter for other specified aftercare: Secondary | ICD-10-CM

## 2014-10-29 MED ORDER — PEGFILGRASTIM INJECTION 6 MG/0.6ML ~~LOC~~
6.0000 mg | PREFILLED_SYRINGE | Freq: Once | SUBCUTANEOUS | Status: AC
  Administered 2014-10-29: 6 mg via SUBCUTANEOUS
  Filled 2014-10-29: qty 0.6

## 2014-10-29 NOTE — Patient Instructions (Signed)
Pegfilgrastim injection What is this medicine? PEGFILGRASTIM (peg fil GRA stim) is a long-acting granulocyte colony-stimulating factor that stimulates the growth of neutrophils, a type of white blood cell important in the body's fight against infection. It is used to reduce the incidence of fever and infection in patients with certain types of cancer who are receiving chemotherapy that affects the bone marrow. This medicine may be used for other purposes; ask your health care provider or pharmacist if you have questions. COMMON BRAND NAME(S): Neulasta What should I tell my health care provider before I take this medicine? They need to know if you have any of these conditions: -latex allergy -ongoing radiation therapy -sickle cell disease -skin reactions to acrylic adhesives (On-Body Injector only) -an unusual or allergic reaction to pegfilgrastim, filgrastim, other medicines, foods, dyes, or preservatives -pregnant or trying to get pregnant -breast-feeding How should I use this medicine? This medicine is for injection under the skin. If you get this medicine at home, you will be taught how to prepare and give the pre-filled syringe or how to use the On-body Injector. Refer to the patient Instructions for Use for detailed instructions. Use exactly as directed. Take your medicine at regular intervals. Do not take your medicine more often than directed. It is important that you put your used needles and syringes in a special sharps container. Do not put them in a trash can. If you do not have a sharps container, call your pharmacist or healthcare provider to get one. Talk to your pediatrician regarding the use of this medicine in children. Special care may be needed. Overdosage: If you think you have taken too much of this medicine contact a poison control center or emergency room at once. NOTE: This medicine is only for you. Do not share this medicine with others. What if I miss a dose? It is  important not to miss your dose. Call your doctor or health care professional if you miss your dose. If you miss a dose due to an On-body Injector failure or leakage, a new dose should be administered as soon as possible using a single prefilled syringe for manual use. What may interact with this medicine? Interactions have not been studied. Give your health care provider a list of all the medicines, herbs, non-prescription drugs, or dietary supplements you use. Also tell them if you smoke, drink alcohol, or use illegal drugs. Some items may interact with your medicine. This list may not describe all possible interactions. Give your health care provider a list of all the medicines, herbs, non-prescription drugs, or dietary supplements you use. Also tell them if you smoke, drink alcohol, or use illegal drugs. Some items may interact with your medicine. What should I watch for while using this medicine? You may need blood work done while you are taking this medicine. If you are going to need a MRI, CT scan, or other procedure, tell your doctor that you are using this medicine (On-Body Injector only). What side effects may I notice from receiving this medicine? Side effects that you should report to your doctor or health care professional as soon as possible: -allergic reactions like skin rash, itching or hives, swelling of the face, lips, or tongue -dizziness -fever -pain, redness, or irritation at site where injected -pinpoint red spots on the skin -shortness of breath or breathing problems -stomach or side pain, or pain at the shoulder -swelling -tiredness -trouble passing urine Side effects that usually do not require medical attention (report to your doctor   or health care professional if they continue or are bothersome): -bone pain -muscle pain This list may not describe all possible side effects. Call your doctor for medical advice about side effects. You may report side effects to FDA at  1-800-FDA-1088. Where should I keep my medicine? Keep out of the reach of children. Store pre-filled syringes in a refrigerator between 2 and 8 degrees C (36 and 46 degrees F). Do not freeze. Keep in carton to protect from light. Throw away this medicine if it is left out of the refrigerator for more than 48 hours. Throw away any unused medicine after the expiration date. NOTE: This sheet is a summary. It may not cover all possible information. If you have questions about this medicine, talk to your doctor, pharmacist, or health care provider.  2015, [employer]vier/Gold Standard. (2014-01-15 16:14:05)  

## 2014-10-31 NOTE — Progress Notes (Signed)
This encounter was created in error - please disregard.

## 2014-11-02 ENCOUNTER — Ambulatory Visit (INDEPENDENT_AMBULATORY_CARE_PROVIDER_SITE_OTHER): Payer: 59 | Admitting: Emergency Medicine

## 2014-11-02 ENCOUNTER — Other Ambulatory Visit (HOSPITAL_BASED_OUTPATIENT_CLINIC_OR_DEPARTMENT_OTHER): Payer: 59

## 2014-11-02 ENCOUNTER — Encounter: Payer: Self-pay | Admitting: Emergency Medicine

## 2014-11-02 VITALS — BP 130/74 | HR 84 | Temp 98.3°F | Ht 69.0 in | Wt 154.0 lb

## 2014-11-02 DIAGNOSIS — C3492 Malignant neoplasm of unspecified part of left bronchus or lung: Secondary | ICD-10-CM

## 2014-11-02 DIAGNOSIS — F17200 Nicotine dependence, unspecified, uncomplicated: Secondary | ICD-10-CM

## 2014-11-02 DIAGNOSIS — C7A1 Malignant poorly differentiated neuroendocrine tumors: Secondary | ICD-10-CM

## 2014-11-02 DIAGNOSIS — Z720 Tobacco use: Secondary | ICD-10-CM

## 2014-11-02 LAB — COMPREHENSIVE METABOLIC PANEL (CC13)
ALT: 12 U/L (ref 0–55)
AST: 14 U/L (ref 5–34)
Albumin: 4 g/dL (ref 3.5–5.0)
Alkaline Phosphatase: 156 U/L — ABNORMAL HIGH (ref 40–150)
Anion Gap: 8 mEq/L (ref 3–11)
BILIRUBIN TOTAL: 0.88 mg/dL (ref 0.20–1.20)
BUN: 15.7 mg/dL (ref 7.0–26.0)
CO2: 29 mEq/L (ref 22–29)
CREATININE: 0.7 mg/dL (ref 0.7–1.3)
Calcium: 9.1 mg/dL (ref 8.4–10.4)
Chloride: 91 mEq/L — ABNORMAL LOW (ref 98–109)
EGFR: >90 mL/min/{1.73_m2} (ref >90)
Glucose: 99 mg/dl (ref 70–140)
Potassium: 4.7 mEq/L (ref 3.5–5.1)
Sodium: 128 mEq/L — ABNORMAL LOW (ref 136–145)
Total Protein: 6.4 g/dL (ref 6.4–8.3)

## 2014-11-02 LAB — CBC WITH DIFFERENTIAL/PLATELET
BASO%: 0.2 % (ref 0.0–2.0)
Basophils Absolute: 0 10*3/uL (ref 0.0–0.1)
EOS ABS: 0 10*3/uL (ref 0.0–0.5)
EOS%: 0.1 % (ref 0.0–7.0)
HCT: 31.4 % — ABNORMAL LOW (ref 38.4–49.9)
HEMOGLOBIN: 10.9 g/dL — AB (ref 13.0–17.1)
LYMPH#: 1.2 10*3/uL (ref 0.9–3.3)
LYMPH%: 8.9 % — AB (ref 14.0–49.0)
MCH: 33 pg (ref 27.2–33.4)
MCHC: 34.7 g/dL (ref 32.0–36.0)
MCV: 95.2 fL (ref 79.3–98.0)
MONO#: 0.8 10*3/uL (ref 0.1–0.9)
MONO%: 6 % (ref 0.0–14.0)
NEUT#: 11.7 10*3/uL — ABNORMAL HIGH (ref 1.5–6.5)
NEUT%: 84.8 % — ABNORMAL HIGH (ref 39.0–75.0)
PLATELETS: 203 10*3/uL (ref 140–400)
RBC: 3.3 10*6/uL — AB (ref 4.20–5.82)
RDW: 13.4 % (ref 11.0–14.6)
WBC: 13.9 10*3/uL — ABNORMAL HIGH (ref 4.0–10.3)

## 2014-11-02 LAB — MAGNESIUM (CC13): Magnesium: 2.3 mg/dl (ref 1.5–2.5)

## 2014-11-02 MED ORDER — NICOTINE 21 MG/24HR TD PT24: 21.0000 mg | MEDICATED_PATCH | Freq: Every day | TRANSDERMAL | Status: DC

## 2014-11-02 MED ORDER — BUPROPION HCL ER (SR) 150 MG PO TB12: 150.0000 mg | ORAL_TABLET | Freq: Two times a day (BID) | ORAL | Status: DC

## 2014-11-02 NOTE — Progress Notes (Signed)
Subjective:    Patient ID: Spencer Long, male    DOB: 09-22-1951, 64 y.o.   MRN: [id number]  HPI   08/31/2014 routine office visit> Mr. Barco is here to see me today to follow-up from his bronchoscopy which was performed by my partner last week. The biopsy from the lymph node as well as the left lower lobe mass demonstrated small cell lung cancer. He is supposed to see his new oncologist this week.  Today he primarily has questions about smoking cessation. He continues to smoke 1-1/2 packs of cigarettes daily and wants to quit. He had very severe side effects from Chantix and does not want to take that again. He has not taken nicotine replacement. He also has questions about whether or not the rash on his arm could be related to his malignancy.  Follow up visit s/p FOB 11/02/14 -- follow-up visit following recent bronchoscopy, nodal biopsy and left lower lobe biopsy that showed small cell lung cancer. He has received chemo x3, CT scan 12/28 shows that he is responding.  He has done well post-procedure. HE HASN'T SMOKED SINCE 11/16!! His cough is better, has done well through the chemo.   Past Medical History  Diagnosis Date  . Hypertension     under control, has been on med. x 15-20 yrs.  [name] GERD (gastroesophageal reflux disease)   . Infection of elbow     right-no infection at present , but has skin lesions present  . Sarcoidosis     "tends to have skin flareups" and presently experiencing now"feet, legs and arms"  . Chronic hyponatremia     Dr. Buddy Duty follows  . Lung cancer      Review of Systems     Objective:   Physical Exam  Filed Vitals:   11/02/14 1342 11/02/14 1344  BP:  130/74  Pulse:  84  Temp: 98.3 F (36.8 C)   TempSrc: Oral   Height: 5\' 9"  (1.753 m)   Weight: 154 lb (69.854 kg)   SpO2:  98%   Gen: well appearing, no acute distress HEENT: NCAT, EOMi, OP clear,  PULM: Wheezing bilaterally CV: RRR, no mgr, no JVD AB: BS+, soft, nontender Ext: warm, no  edema, no clubbing, no cyanosis Derm: no rash or skin breakdown Neuro: A&Ox4, MAEW     Assessment & Plan:   Tobacco use disorder He has quit!! Is using wellbutrin and patches. Occasionally vapors.    Small cell lung cancer One more dose chemo 1/18, then repeat Ct scan to assess response.     Updated Medication List Outpatient Encounter Prescriptions as of 11/02/2014  Medication Sig  . amLODipine (NORVASC) 10 MG tablet Take 10 mg by mouth every morning.   [name] aspirin EC 81 MG tablet Take 81 mg by mouth every morning.   [name] atenolol (TENORMIN) 25 MG tablet Take 25 mg by mouth 2 (two) times daily.    . Azilsartan Medoxomil (EDARBI) 80 MG TABS Take 1 tablet by mouth every morning.  [name] buPROPion (WELLBUTRIN SR) 150 MG 12 hr tablet Take 150mg  daily for one week, then take 150mg  by mouth twice a day (Patient taking differently: Take 150 mg by mouth 2 (two) times daily. )  . clobetasol cream (TEMOVATE) 0.81 % Apply 1 application topically 2 (two) times daily as needed.  . folic acid (FOLVITE) 1 MG tablet Take 1 mg by mouth 2 (two) times daily.  [name] morphine (MSIR) 15 MG tablet Take 1 tablet (15 mg total) by  mouth every 6 (six) hours as needed for severe pain.  . Multiple Vitamins-Minerals (MULTIVITAMIN ADULTS 50+ PO) Take 1 tablet by mouth every morning.   . nicotine (NICODERM CQ - DOSED IN MG/24 HOURS) 21 mg/24hr patch Place 1 patch (21 mg total) onto the skin daily.  [name] omeprazole (PRILOSEC) 40 MG capsule Take 40 mg by mouth every morning.  . Probiotic Product (PROBIOTIC DAILY PO) Take 1 tablet by mouth every morning.   . prochlorperazine (COMPAZINE) 10 MG tablet Take 1 tablet (10 mg total) by mouth every 6 (six) hours as needed for nausea.  . sodium chloride 1 G tablet Take 1 g by mouth 3 (three) times daily.  . white petrolatum (VASELINE) GEL Apply 1 application topically daily as needed for lip care or dry skin.  [name] dicyclomine (BENTYL) 20 MG tablet Take 20 mg by mouth every 4 (four) hours as  needed for spasms.   . ondansetron (ZOFRAN) 8 MG tablet Take 1 tablet (8 mg total) by mouth every 8 (eight) hours as needed for nausea or vomiting (start on 3rd day after chemo). (Patient not taking: Reported on 11/02/2014)

## 2014-11-02 NOTE — Assessment & Plan Note (Signed)
One more dose chemo 1/18, then repeat Ct scan to assess response.

## 2014-11-02 NOTE — Patient Instructions (Addendum)
Congratulations on stopping smoking!! We will refill urine nicotine patches 14 mg We will refill your Wellbutrin We will perform pulmonary function testing in the future to assess your lung function. We will wait to do this until after this round of chemotherapy Follow with Dr Lamonte Sakai in 3 months or sooner if you have any problems.

## 2014-11-02 NOTE — Assessment & Plan Note (Signed)
He has quit!! Is using wellbutrin and patches. Occasionally vapors.

## 2014-11-09 ENCOUNTER — Other Ambulatory Visit (HOSPITAL_BASED_OUTPATIENT_CLINIC_OR_DEPARTMENT_OTHER): Payer: 59

## 2014-11-09 DIAGNOSIS — C3492 Malignant neoplasm of unspecified part of left bronchus or lung: Secondary | ICD-10-CM

## 2014-11-09 DIAGNOSIS — C7A1 Malignant poorly differentiated neuroendocrine tumors: Secondary | ICD-10-CM

## 2014-11-09 LAB — COMPREHENSIVE METABOLIC PANEL (CC13)
ALT: 14 U/L (ref 0–55)
AST: 17 U/L (ref 5–34)
Albumin: 3.9 g/dL (ref 3.5–5.0)
Alkaline Phosphatase: 149 U/L (ref 40–150)
Anion Gap: 8 mEq/L (ref 3–11)
BUN: 10.7 mg/dL (ref 7.0–26.0)
CALCIUM: 9 mg/dL (ref 8.4–10.4)
CHLORIDE: 97 meq/L — AB (ref 98–109)
CO2: 29 meq/L (ref 22–29)
Creatinine: 0.8 mg/dL (ref 0.7–1.3)
EGFR: >90 mL/min/{1.73_m2} (ref >90)
GLUCOSE: 101 mg/dL (ref 70–140)
POTASSIUM: 4.9 meq/L (ref 3.5–5.1)
Sodium: 134 mEq/L — ABNORMAL LOW (ref 136–145)
Total Bilirubin: 0.43 mg/dL (ref 0.20–1.20)
Total Protein: 6.3 g/dL — ABNORMAL LOW (ref 6.4–8.3)

## 2014-11-09 LAB — MAGNESIUM (CC13): Magnesium: 2.2 mg/dl (ref 1.5–2.5)

## 2014-11-09 LAB — CBC WITH DIFFERENTIAL/PLATELET
BASO%: 0.6 % (ref 0.0–2.0)
BASOS ABS: 0.1 10*3/uL (ref 0.0–0.1)
EOS%: 0.3 % (ref 0.0–7.0)
Eosinophils Absolute: 0 10*3/uL (ref 0.0–0.5)
HEMATOCRIT: 32.2 % — AB (ref 38.4–49.9)
HEMOGLOBIN: 11.1 g/dL — AB (ref 13.0–17.1)
LYMPH#: 1.5 10*3/uL (ref 0.9–3.3)
LYMPH%: 11.8 % — ABNORMAL LOW (ref 14.0–49.0)
MCH: 33.3 pg (ref 27.2–33.4)
MCHC: 34.5 g/dL (ref 32.0–36.0)
MCV: 96.7 fL (ref 79.3–98.0)
MONO#: 1.2 10*3/uL — ABNORMAL HIGH (ref 0.1–0.9)
MONO%: 9.9 % (ref 0.0–14.0)
NEUT%: 77.4 % — ABNORMAL HIGH (ref 39.0–75.0)
NEUTROS ABS: 9.6 10*3/uL — AB (ref 1.5–6.5)
Platelets: 155 10*3/uL (ref 140–400)
RBC: 3.33 10*6/uL — ABNORMAL LOW (ref 4.20–5.82)
RDW: 14.7 % — AB (ref 11.0–14.6)
WBC: 12.4 10*3/uL — AB (ref 4.0–10.3)

## 2014-11-12 ENCOUNTER — Encounter: Payer: Self-pay | Admitting: Internal Medicine

## 2014-11-16 ENCOUNTER — Other Ambulatory Visit (HOSPITAL_BASED_OUTPATIENT_CLINIC_OR_DEPARTMENT_OTHER): Payer: 59

## 2014-11-16 ENCOUNTER — Telehealth: Payer: Self-pay | Admitting: Physician Assistant

## 2014-11-16 ENCOUNTER — Ambulatory Visit (HOSPITAL_BASED_OUTPATIENT_CLINIC_OR_DEPARTMENT_OTHER): Payer: 59

## 2014-11-16 ENCOUNTER — Ambulatory Visit: Payer: 59 | Admitting: Nutrition

## 2014-11-16 ENCOUNTER — Other Ambulatory Visit: Payer: Managed Care, Other (non HMO)

## 2014-11-16 ENCOUNTER — Ambulatory Visit (HOSPITAL_BASED_OUTPATIENT_CLINIC_OR_DEPARTMENT_OTHER): Payer: 59 | Admitting: Physician Assistant

## 2014-11-16 ENCOUNTER — Encounter: Payer: Self-pay | Admitting: Physician Assistant

## 2014-11-16 ENCOUNTER — Other Ambulatory Visit: Payer: Self-pay

## 2014-11-16 ENCOUNTER — Telehealth: Payer: Self-pay | Admitting: *Deleted

## 2014-11-16 VITALS — BP 151/72 | HR 80 | Temp 98.1°F | Resp 18 | Ht 69.0 in | Wt 155.1 lb

## 2014-11-16 DIAGNOSIS — Z5111 Encounter for antineoplastic chemotherapy: Secondary | ICD-10-CM

## 2014-11-16 DIAGNOSIS — C3492 Malignant neoplasm of unspecified part of left bronchus or lung: Secondary | ICD-10-CM

## 2014-11-16 DIAGNOSIS — C7A1 Malignant poorly differentiated neuroendocrine tumors: Secondary | ICD-10-CM

## 2014-11-16 DIAGNOSIS — R5383 Other fatigue: Secondary | ICD-10-CM

## 2014-11-16 LAB — CBC WITH DIFFERENTIAL/PLATELET
BASO%: 0.3 % (ref 0.0–2.0)
BASOS ABS: 0 10*3/uL (ref 0.0–0.1)
EOS ABS: 0 10*3/uL (ref 0.0–0.5)
EOS%: 0.3 % (ref 0.0–7.0)
HCT: 32.7 % — ABNORMAL LOW (ref 38.4–49.9)
HGB: 11.2 g/dL — ABNORMAL LOW (ref 13.0–17.1)
LYMPH%: 17 % (ref 14.0–49.0)
MCH: 33.4 pg (ref 27.2–33.4)
MCHC: 34.3 g/dL (ref 32.0–36.0)
MCV: 97.6 fL (ref 79.3–98.0)
MONO#: 1.3 10*3/uL — AB (ref 0.1–0.9)
MONO%: 13.8 % (ref 0.0–14.0)
NEUT#: 6.3 10*3/uL (ref 1.5–6.5)
NEUT%: 68.6 % (ref 39.0–75.0)
Platelets: 264 10*3/uL (ref 140–400)
RBC: 3.35 10*6/uL — AB (ref 4.20–5.82)
RDW: 14.9 % — AB (ref 11.0–14.6)
WBC: 9.1 10*3/uL (ref 4.0–10.3)
lymph#: 1.6 10*3/uL (ref 0.9–3.3)

## 2014-11-16 LAB — COMPREHENSIVE METABOLIC PANEL (CC13)
ALT: 14 U/L (ref 0–55)
AST: 14 U/L (ref 5–34)
Albumin: 3.9 g/dL (ref 3.5–5.0)
Alkaline Phosphatase: 82 U/L (ref 40–150)
Anion Gap: 9 meq/L (ref 3–11)
BUN: 13.5 mg/dL (ref 7.0–26.0)
CO2: 27 meq/L (ref 22–29)
Calcium: 8.8 mg/dL (ref 8.4–10.4)
Chloride: 99 meq/L (ref 98–109)
Creatinine: 0.7 mg/dL (ref 0.7–1.3)
EGFR: >90 ml/min/1.73 m2 (ref >90)
Glucose: 98 mg/dL (ref 70–140)
Potassium: 5.3 meq/L — ABNORMAL HIGH (ref 3.5–5.1)
Sodium: 135 meq/L — ABNORMAL LOW (ref 136–145)
Total Bilirubin: 0.61 mg/dL (ref 0.20–1.20)
Total Protein: 6.1 g/dL — ABNORMAL LOW (ref 6.4–8.3)

## 2014-11-16 LAB — MAGNESIUM (CC13): Magnesium: 2.2 mg/dL (ref 1.5–2.5)

## 2014-11-16 MED ORDER — DEXAMETHASONE SODIUM PHOSPHATE 20 MG/5ML IJ SOLN
INTRAMUSCULAR | Status: AC
  Filled 2014-11-16: qty 5

## 2014-11-16 MED ORDER — PALONOSETRON HCL INJECTION 0.25 MG/5ML
INTRAVENOUS | Status: AC
  Filled 2014-11-16: qty 5

## 2014-11-16 MED ORDER — SODIUM CHLORIDE 0.9 % IV SOLN
120.0000 mg/m2 | Freq: Once | INTRAVENOUS | Status: AC
  Administered 2014-11-16: 210 mg via INTRAVENOUS
  Filled 2014-11-16: qty 10.5

## 2014-11-16 MED ORDER — DEXAMETHASONE SODIUM PHOSPHATE 20 MG/5ML IJ SOLN
12.0000 mg | Freq: Once | INTRAMUSCULAR | Status: AC
  Administered 2014-11-16: 12 mg via INTRAVENOUS

## 2014-11-16 MED ORDER — SODIUM CHLORIDE 0.9 % IV SOLN
60.0000 mg/m2 | Freq: Once | INTRAVENOUS | Status: AC
  Administered 2014-11-16: 107 mg via INTRAVENOUS
  Filled 2014-11-16: qty 107

## 2014-11-16 MED ORDER — SODIUM CHLORIDE 0.9 % IV SOLN
150.0000 mg | Freq: Once | INTRAVENOUS | Status: AC
  Administered 2014-11-16: 150 mg via INTRAVENOUS
  Filled 2014-11-16: qty 5

## 2014-11-16 MED ORDER — PALONOSETRON HCL INJECTION 0.25 MG/5ML
0.2500 mg | Freq: Once | INTRAVENOUS | Status: AC
  Administered 2014-11-16: 0.25 mg via INTRAVENOUS

## 2014-11-16 MED ORDER — MORPHINE SULFATE 15 MG PO TABS: 15.0000 mg | ORAL_TABLET | Freq: Four times a day (QID) | ORAL | Status: DC | PRN

## 2014-11-16 MED ORDER — POTASSIUM CHLORIDE 2 MEQ/ML IV SOLN
Freq: Once | INTRAVENOUS | Status: AC
  Administered 2014-11-16: 10:00:00 via INTRAVENOUS
  Filled 2014-11-16: qty 10

## 2014-11-16 MED ORDER — SODIUM CHLORIDE 0.9 % IV SOLN
Freq: Once | INTRAVENOUS | Status: AC
  Administered 2014-11-16: 10:00:00 via INTRAVENOUS

## 2014-11-16 NOTE — Patient Instructions (Signed)
[facility] Discharge Instructions for Patients Receiving Chemotherapy  Today you received the following chemotherapy agents: Cisplatin and Etposide.  To help prevent nausea and vomiting after your treatment, we encourage you to take your nausea medication: Compazaine 10 mg every 6 hours as needed. Zofran 8 mg every 8 hours as needed.   If you develop nausea and vomiting that is not controlled by your nausea medication, call the clinic.   BELOW ARE SYMPTOMS THAT SHOULD BE REPORTED IMMEDIATELY:  *FEVER GREATER THAN 100.5 F  *CHILLS WITH OR WITHOUT FEVER  NAUSEA AND VOMITING THAT IS NOT CONTROLLED WITH YOUR NAUSEA MEDICATION  *UNUSUAL SHORTNESS OF BREATH  *UNUSUAL BRUISING OR BLEEDING  TENDERNESS IN MOUTH AND THROAT WITH OR WITHOUT PRESENCE OF ULCERS  *URINARY PROBLEMS  *BOWEL PROBLEMS  UNUSUAL RASH Items with * indicate a potential emergency and should be followed up as soon as possible.  Feel free to call the clinic you have any questions or concerns. The clinic phone number is [phone].

## 2014-11-16 NOTE — Patient Instructions (Signed)
Contact her dermatologist if your rash persists or worsens Follow-up in 3 weeks with a restaging CT scan of your chest, abdomen and pelvis to reevaluate your disease

## 2014-11-16 NOTE — Progress Notes (Addendum)
No images are attached to the encounter. No scans are attached to the encounter. No scans are attached to the encounter. [facility] VISIT PROGRESS NOTE  [name], [place] [address]  DIAGNOSIS: Small cell lung cancer   Staging form: Lung, AJCC 7th Edition     Clinical: T2, N1, M0 - Unsigned Extensive Stage  PRIOR THERAPY: none  CURRENT THERAPY: Systemic chemotherapy with cisplatin 60 MG/M2 on day 1 and etoposide 120 MG/M2 on days 1, 2 and 3 with Neulasta support on day 4. Status post 3 cycles.  INTERVAL HISTORY: Spencer Long 64 y.o. male returns for a scheduled regular symptom management visit for followup of his recently diagnosed small cell lung cancer. He is accompanied by his wife today. Overall he continues to tolerate his chemotherapy with cisplatin and etoposide with Neulasta support relatively well. He denied any issues with shortness of breath, cough, hemoptysis, fever, chills, nausea or vomiting. He is drinking at least 64 ounces of fluids daily is sleeping well and has a good appetite. He reports a rash that developed on his right wrist and left forearm in the past week. He is followed by Dr. Isidor Holts, his dermatologist and is currently using clobetasol cream per her instructions. He was told he was anemic at his last visit and has been increasing his dietary intake of high iron foods as well as increasing his intake of orange juice and similar foods to increase the absorption of the iron. He presents to proceed with cycle #4. He requests a refill for his morphine.  MEDICAL HISTORY: Past Medical History  Diagnosis Date  . Hypertension     under control, has been on med. x 15-20 yrs.  [name] GERD (gastroesophageal reflux disease)   . Infection of elbow     right-no infection at present , but has skin lesions present  . Sarcoidosis     "tends to have skin flareups" and presently experiencing now"feet, legs  and arms"  . Chronic hyponatremia     Dr. Buddy Duty follows  . Lung cancer     ALLERGIES:  is allergic to varenicline; isothiazolinone chloride; oxycodone; and quaternium-15.  MEDICATIONS:  Current Outpatient Prescriptions  Medication Sig Dispense Refill  . amLODipine (NORVASC) 10 MG tablet Take 10 mg by mouth every morning.     [name] aspirin EC 81 MG tablet Take 81 mg by mouth every morning.     [name] atenolol (TENORMIN) 25 MG tablet Take 25 mg by mouth 2 (two) times daily.      . Azilsartan Medoxomil (EDARBI) 80 MG TABS Take 1 tablet by mouth every morning.    [name] buPROPion (WELLBUTRIN SR) 150 MG 12 hr tablet Take 1 tablet (150 mg total) by mouth 2 (two) times daily. 60 tablet 2  . clobetasol cream (TEMOVATE) 6.06 % Apply 1 application topically 2 (two) times daily as needed.    . dicyclomine (BENTYL) 20 MG tablet Take 20 mg by mouth every 4 (four) hours as needed for spasms.     . folic acid (FOLVITE) 1 MG tablet Take 1 mg by mouth 2 (two) times daily.    . Multiple Vitamins-Minerals (MULTIVITAMIN ADULTS 50+ PO) Take 1 tablet by mouth every morning.     . nicotine (NICODERM CQ - DOSED IN MG/24 HOURS) 21 mg/24hr patch Place 1 patch (21 mg total) onto the skin daily. 28 patch 2  . omeprazole (PRILOSEC) 40 MG capsule Take 40 mg by  mouth every morning.    . Probiotic Product (PROBIOTIC DAILY PO) Take 1 tablet by mouth every morning.     . prochlorperazine (COMPAZINE) 10 MG tablet Take 1 tablet (10 mg total) by mouth every 6 (six) hours as needed for nausea. 60 tablet 1  . sodium chloride 1 G tablet Take 1 g by mouth 3 (three) times daily.    . white petrolatum (VASELINE) GEL Apply 1 application topically daily as needed for lip care or dry skin.    [name] morphine (MSIR) 15 MG tablet Take 1 tablet (15 mg total) by mouth every 6 (six) hours as needed for severe pain. 30 tablet 0  . ondansetron (ZOFRAN) 8 MG tablet Take 1 tablet (8 mg total) by mouth every 8 (eight) hours as needed for nausea or vomiting (start  on 3rd day after chemo). (Patient not taking: Reported on 11/02/2014) 20 tablet 1   No current facility-administered medications for this visit.    SURGICAL HISTORY:  Past Surgical History  Procedure Laterality Date  . Cholecystectomy  05/2006  . Ulnar nerve repair  01/2008    right; decompression  . Carpal tunnel release  01/2008    right  . Eye surgery  12/2008    correction ectropion, release band, wedge exc., shortening  . I&d extremity  09/12/2011    Procedure: IRRIGATION AND DEBRIDEMENT EXTREMITY;  Surgeon: Tennis Must;  Location: [facility];  Service: Orthopedics;  Laterality: Right;  I&d RIGHT OLECRANON BURSSA   . Tonsillectomy    . Endobronchial ultrasound Bilateral 08/24/2014    Procedure: ENDOBRONCHIAL ULTRASOUND;  Surgeon: Collene Gobble, MD;  Location: [facility];  Service: Cardiopulmonary;  Laterality: Bilateral;    REVIEW OF SYSTEMS:  Review of Systems  Constitutional: Positive for malaise/fatigue. Negative for fever, chills, weight loss and diaphoresis.  HENT: Negative for congestion, ear discharge, ear pain, hearing loss, nosebleeds, sore throat and tinnitus.   Eyes: Negative for blurred vision, double vision, photophobia, pain, discharge and redness.  Respiratory: Negative for cough, hemoptysis, sputum production, shortness of breath, wheezing and stridor.   Cardiovascular: Negative for chest pain, palpitations, orthopnea, claudication, leg swelling and PND.  Gastrointestinal: Negative for heartburn, nausea, vomiting, abdominal pain, diarrhea, constipation, blood in stool and melena.  Genitourinary: Negative.   Musculoskeletal: Negative.   Skin: Positive for rash.  Neurological: Negative for dizziness, tingling, focal weakness, seizures, weakness and headaches.  Endo/Heme/Allergies: Does not bruise/bleed easily.  Psychiatric/Behavioral: Negative for depression. The patient is not nervous/anxious and does not have insomnia.      PHYSICAL  EXAMINATION: Physical Exam  Constitutional: He is oriented to person, place, and time and well-developed, well-nourished, and in no distress.  HENT:  Head: Normocephalic and atraumatic.  Mouth/Throat: Oropharynx is clear and moist.  Eyes: Conjunctivae and EOM are normal. Pupils are equal, round, and reactive to light.  Neck: Normal range of motion. Neck supple.  Cardiovascular: Normal rate, regular rhythm, normal heart sounds and intact distal pulses.   Pulmonary/Chest: Effort normal and breath sounds normal.  Abdominal: Soft. Bowel sounds are normal.  Musculoskeletal: Normal range of motion.  Neurological: He is alert and oriented to person, place, and time. Gait normal.  Skin: Skin is warm and dry. Rash noted.  Approximately one to one and half centimeter circular erythematous slightly raised eruption medial aspect of the right wrist. Medial aspect mid left forearm ovoid erythematous raised plaque-like lesions with smaller similar satellite adjacent lesion.  Psychiatric: Mood, memory, affect and judgment normal.  Vitals reviewed.   ECOG PERFORMANCE STATUS: 1 - Symptomatic but completely ambulatory  Blood pressure 151/72, pulse 80, temperature 98.1 F (36.7 C), temperature source Oral, resp. rate 18, height 5\' 9"  (1.753 m), weight 155 lb 1.6 oz (70.353 kg), SpO2 99 %.  LABORATORY DATA: Lab Results  Component Value Date   WBC 9.1 11/16/2014   HGB 11.2* 11/16/2014   HCT 32.7* 11/16/2014   MCV 97.6 11/16/2014   PLT 264 11/16/2014      Chemistry      Component Value Date/Time   NA 135* 11/16/2014 0815   NA 125* 09/12/2011 1302   K 5.3* 11/16/2014 0815   K 5.5* 09/12/2011 1302   CL 95* 09/12/2011 1302   CO2 27 11/16/2014 0815   CO2 26 12/25/2008 1425   BUN 13.5 11/16/2014 0815   BUN 19 09/12/2011 1302   CREATININE 0.7 11/16/2014 0815   CREATININE 1.00 09/12/2011 1302      Component Value Date/Time   CALCIUM 8.8 11/16/2014 0815   CALCIUM 9.1 12/25/2008 1425   ALKPHOS 82  11/16/2014 0815   AST 14 11/16/2014 0815   ALT 14 11/16/2014 0815   BILITOT 0.61 11/16/2014 0815       RADIOGRAPHIC STUDIES:  Ct Chest W Contrast  10/21/2014   CLINICAL DATA:  restaging extensive stage small cell lung cancer  Comments: 174ml omni 300, small cell lt lung ca 11'15, Chemo continues, w/o chest or abd complaint, ex-smoker x 1 month, cholecystectomy//a.c.  EXAM: CT CHEST, ABDOMEN, AND PELVIS WITH CONTRAST  TECHNIQUE: Multidetector CT imaging of the chest, abdomen and pelvis was performed following the standard protocol during bolus administration of intravenous contrast.  CONTRAST:  140mL OMNIPAQUE IOHEXOL 300 MG/ML  SOLN  COMPARISON:  PET-CT, 09/11/2014. Abdomen pelvis CT 09/28/2011 and abdomen CT, 07/30/2014.  FINDINGS: CT CHEST FINDINGS  Small soft tissue mass adjacent to the central left lower lobe bronchovascular structures in the infrahilar region on the left, now measures 13 mm x 10 mm, on the prior PET-CT measuring approximately 15 mm x 14 mm. No mediastinal masses or adenopathy. No neck base or axillary masses or adenopathy. No right hilar mass or adenopathy.  Peribronchial opacities noted in the left lower lobe, which may all be atelectasis. Metastatic disease is felt unlikely. It could be inflammatory/ infectious in etiology. Atelectasis is favored.  Mild centrilobular emphysema is noted in the upper lobes. There are few areas of minor reticular scarring. No discrete lung masses or nodules. No pleural effusions.  CT ABDOMEN AND PELVIS FINDINGS  Portal caval node in the porta hepatis, posterior to the portal vein, has decreased in size the prior study, currently 8.5 mm in short axis by 15 mm in long axis, previously 2 cm in short axis by 2.8 cm in long axis.  Liver is unremarkable. Normal spleen and pancreas. No bile duct dilation. Gallbladder surgically absent. No adrenal masses.  Kidneys show mild cortical thinning. Right intrarenal stones noted previously are stable. No  hydronephrosis. Ureters and bladder are unremarkable.  No other adenopathy.  Mild to moderate increased stool burden in the colon. Numerous left colon diverticula. No diverticulitis or colonic inflammation.  Normal small bowel.  Normal appendix.  Dense atherosclerotic change noted along the abdominal aorta and its branch vessels. This causes apparent significant narrowing of the proximal left renal artery. These findings are unchanged.  MUSCULOSKELETAL  No osteoblastic or osteolytic lesions.  IMPRESSION: 1. Since the prior PET-CT, there has been a positive response to therapy. Left infrahilar  mass has decreased in size as has the portal caval lymph node. There are no new abnormalities to suggest new metastatic disease. 2. Left lower lobe opacity is similar to the prior PET-CT. This is most likely atelectasis and is not suspicious for metastatic disease.   Electronically Signed   By: Lajean Manes M.D.   On: 10/21/2014 10:18   Ct Abdomen Pelvis W Contrast  10/21/2014   CLINICAL DATA:  restaging extensive stage small cell lung cancer  Comments: 135ml omni 300, small cell lt lung ca 11'15, Chemo continues, w/o chest or abd complaint, ex-smoker x 1 month, cholecystectomy//a.c.  EXAM: CT CHEST, ABDOMEN, AND PELVIS WITH CONTRAST  TECHNIQUE: Multidetector CT imaging of the chest, abdomen and pelvis was performed following the standard protocol during bolus administration of intravenous contrast.  CONTRAST:  157mL OMNIPAQUE IOHEXOL 300 MG/ML  SOLN  COMPARISON:  PET-CT, 09/11/2014. Abdomen pelvis CT 09/28/2011 and abdomen CT, 07/30/2014.  FINDINGS: CT CHEST FINDINGS  Small soft tissue mass adjacent to the central left lower lobe bronchovascular structures in the infrahilar region on the left, now measures 13 mm x 10 mm, on the prior PET-CT measuring approximately 15 mm x 14 mm. No mediastinal masses or adenopathy. No neck base or axillary masses or adenopathy. No right hilar mass or adenopathy.  Peribronchial opacities  noted in the left lower lobe, which may all be atelectasis. Metastatic disease is felt unlikely. It could be inflammatory/ infectious in etiology. Atelectasis is favored.  Mild centrilobular emphysema is noted in the upper lobes. There are few areas of minor reticular scarring. No discrete lung masses or nodules. No pleural effusions.  CT ABDOMEN AND PELVIS FINDINGS  Portal caval node in the porta hepatis, posterior to the portal vein, has decreased in size the prior study, currently 8.5 mm in short axis by 15 mm in long axis, previously 2 cm in short axis by 2.8 cm in long axis.  Liver is unremarkable. Normal spleen and pancreas. No bile duct dilation. Gallbladder surgically absent. No adrenal masses.  Kidneys show mild cortical thinning. Right intrarenal stones noted previously are stable. No hydronephrosis. Ureters and bladder are unremarkable.  No other adenopathy.  Mild to moderate increased stool burden in the colon. Numerous left colon diverticula. No diverticulitis or colonic inflammation.  Normal small bowel.  Normal appendix.  Dense atherosclerotic change noted along the abdominal aorta and its branch vessels. This causes apparent significant narrowing of the proximal left renal artery. These findings are unchanged.  MUSCULOSKELETAL  No osteoblastic or osteolytic lesions.  IMPRESSION: 1. Since the prior PET-CT, there has been a positive response to therapy. Left infrahilar mass has decreased in size as has the portal caval lymph node. There are no new abnormalities to suggest new metastatic disease. 2. Left lower lobe opacity is similar to the prior PET-CT. This is most likely atelectasis and is not suspicious for metastatic disease.   Electronically Signed   By: Lajean Manes M.D.   On: 10/21/2014 10:18     ASSESSMENT/PLAN:  No problem-specific assessment & plan notes found for this encounter. No problem-specific assessment & plan notes found for this encounter. Patient was seen and examined with  Dr. [name]. He was advised to continue using his current topical creams as prescribed by his dermatologist. Should his current rash not resolve over or worsen he is to contact his dermatologist for further evaluation and management. He'll proceed with cycle #4 systemic chemotherapy with cisplatin and etoposide with Neulasta support as scheduled today. He  was given a refill of his morphine for pain management. He will return in 3 weeks with a restaging CT scan of the chest, abdomen and pelvis with contrast to reevaluate his disease, prior to start of cycle #5.  All questions were answered. The patient knows to call the clinic with any problems, questions or concerns. We can certainly see the patient much sooner if necessary.  Carlton Adam, PA-C 11/16/2014  ADDENDUM: Hematology/Oncology Attending: I had a face to face encounter with the patient. I recommended his care plan. This is a very pleasant 64 years old white male with extensive stage small cell lung cancer currently undergoing systemic chemotherapy with cisplatin and etoposide status post 3 cycles. The patient is tolerating his treatment fairly well with no significant adverse effect except for mild fatigue. I recommended for him to proceed with cycle #4 today as a scheduled. He would come back for follow-up visit in 3 weeks with the start of cycle #5 of Dr. repeating CT scan of the chest, abdomen and pelvis for restaging of his disease. The patient was given a refill of his pain medication today.  Disclaimer: This note was dictated with voice recognition software. Similar sounding words can inadvertently be transcribed and may be missed upon review. Eilleen Kempf., MD 11/16/2014

## 2014-11-16 NOTE — Telephone Encounter (Signed)
Gave avs & cal for  Jan/Feb. Sent mess to sch tx.

## 2014-11-16 NOTE — Progress Notes (Signed)
Nutrition Follow up completed with pt and wife during chemo.  Pt being treated for small cell lung CA.  Pt reports he still has no appetite, but is managing to eat and drink 1 Boost/day.  No nausea at this time.  Wt 155# with coat, stable with 4# gain since last assessment Wife states husband still slightly anemic, but improved since incorporating more high iron foods.   Nutrition Diagnosis:  Food and nutrition related knowledge deficit continues but improved.   Monitoring evaluation and goals:  Pt maintaining wt and diet and supplements.  Next visit: Will be scheduled as needed.

## 2014-11-16 NOTE — Telephone Encounter (Signed)
Per staff message and POF I have scheduled appts. Advised scheduler of appts. JMW  

## 2014-11-17 ENCOUNTER — Ambulatory Visit (HOSPITAL_BASED_OUTPATIENT_CLINIC_OR_DEPARTMENT_OTHER): Payer: 59

## 2014-11-17 DIAGNOSIS — Z5111 Encounter for antineoplastic chemotherapy: Secondary | ICD-10-CM

## 2014-11-17 DIAGNOSIS — C7A1 Malignant poorly differentiated neuroendocrine tumors: Secondary | ICD-10-CM

## 2014-11-17 DIAGNOSIS — C3492 Malignant neoplasm of unspecified part of left bronchus or lung: Secondary | ICD-10-CM

## 2014-11-17 MED ORDER — DEXAMETHASONE SODIUM PHOSPHATE 10 MG/ML IJ SOLN
10.0000 mg | Freq: Once | INTRAMUSCULAR | Status: AC
  Administered 2014-11-17: 10 mg via INTRAVENOUS

## 2014-11-17 MED ORDER — DEXAMETHASONE SODIUM PHOSPHATE 10 MG/ML IJ SOLN
INTRAMUSCULAR | Status: AC
  Filled 2014-11-17: qty 1

## 2014-11-17 MED ORDER — SODIUM CHLORIDE 0.9 % IV SOLN
Freq: Once | INTRAVENOUS | Status: AC
  Administered 2014-11-17: 10:00:00 via INTRAVENOUS

## 2014-11-17 MED ORDER — SODIUM CHLORIDE 0.9 % IV SOLN
120.0000 mg/m2 | Freq: Once | INTRAVENOUS | Status: AC
  Administered 2014-11-17: 210 mg via INTRAVENOUS
  Filled 2014-11-17: qty 10.5

## 2014-11-17 NOTE — Patient Instructions (Signed)
[facility] Discharge Instructions for Patients Receiving Chemotherapy  Today you received the following chemotherapy agents etoposide  To help prevent nausea and vomiting after your treatment, we encourage you to take your nausea medication as directed   If you develop nausea and vomiting that is not controlled by your nausea medication, call the clinic.   BELOW ARE SYMPTOMS THAT SHOULD BE REPORTED IMMEDIATELY:  *FEVER GREATER THAN 100.5 F  *CHILLS WITH OR WITHOUT FEVER  NAUSEA AND VOMITING THAT IS NOT CONTROLLED WITH YOUR NAUSEA MEDICATION  *UNUSUAL SHORTNESS OF BREATH  *UNUSUAL BRUISING OR BLEEDING  TENDERNESS IN MOUTH AND THROAT WITH OR WITHOUT PRESENCE OF ULCERS  *URINARY PROBLEMS  *BOWEL PROBLEMS  UNUSUAL RASH Items with * indicate a potential emergency and should be followed up as soon as possible.  Feel free to call the clinic you have any questions or concerns. The clinic phone number is [phone].

## 2014-11-18 ENCOUNTER — Ambulatory Visit (HOSPITAL_BASED_OUTPATIENT_CLINIC_OR_DEPARTMENT_OTHER): Payer: 59

## 2014-11-18 DIAGNOSIS — Z5111 Encounter for antineoplastic chemotherapy: Secondary | ICD-10-CM

## 2014-11-18 DIAGNOSIS — C3492 Malignant neoplasm of unspecified part of left bronchus or lung: Secondary | ICD-10-CM

## 2014-11-18 DIAGNOSIS — C7A1 Malignant poorly differentiated neuroendocrine tumors: Secondary | ICD-10-CM

## 2014-11-18 MED ORDER — SODIUM CHLORIDE 0.9 % IV SOLN
120.0000 mg/m2 | Freq: Once | INTRAVENOUS | Status: AC
  Administered 2014-11-18: 210 mg via INTRAVENOUS
  Filled 2014-11-18: qty 10.5

## 2014-11-18 MED ORDER — DEXAMETHASONE SODIUM PHOSPHATE 10 MG/ML IJ SOLN
10.0000 mg | Freq: Once | INTRAMUSCULAR | Status: AC
  Administered 2014-11-18: 10 mg via INTRAVENOUS

## 2014-11-18 MED ORDER — DEXAMETHASONE SODIUM PHOSPHATE 10 MG/ML IJ SOLN
INTRAMUSCULAR | Status: AC
  Filled 2014-11-18: qty 1

## 2014-11-18 MED ORDER — SODIUM CHLORIDE 0.9 % IV SOLN
Freq: Once | INTRAVENOUS | Status: AC
  Administered 2014-11-18: 10:00:00 via INTRAVENOUS

## 2014-11-18 NOTE — Patient Instructions (Signed)
[facility] Discharge Instructions for Patients Receiving Chemotherapy  Today you received the following chemotherapy agents etoposide  To help prevent nausea and vomiting after your treatment, we encourage you to take your nausea medication as directed   If you develop nausea and vomiting that is not controlled by your nausea medication, call the clinic.   BELOW ARE SYMPTOMS THAT SHOULD BE REPORTED IMMEDIATELY:  *FEVER GREATER THAN 100.5 F  *CHILLS WITH OR WITHOUT FEVER  NAUSEA AND VOMITING THAT IS NOT CONTROLLED WITH YOUR NAUSEA MEDICATION  *UNUSUAL SHORTNESS OF BREATH  *UNUSUAL BRUISING OR BLEEDING  TENDERNESS IN MOUTH AND THROAT WITH OR WITHOUT PRESENCE OF ULCERS  *URINARY PROBLEMS  *BOWEL PROBLEMS  UNUSUAL RASH Items with * indicate a potential emergency and should be followed up as soon as possible.  Feel free to call the clinic you have any questions or concerns. The clinic phone number is [phone].

## 2014-11-19 ENCOUNTER — Ambulatory Visit (HOSPITAL_BASED_OUTPATIENT_CLINIC_OR_DEPARTMENT_OTHER): Payer: 59

## 2014-11-19 DIAGNOSIS — Z5189 Encounter for other specified aftercare: Secondary | ICD-10-CM

## 2014-11-19 DIAGNOSIS — C7A1 Malignant poorly differentiated neuroendocrine tumors: Secondary | ICD-10-CM

## 2014-11-19 DIAGNOSIS — C3492 Malignant neoplasm of unspecified part of left bronchus or lung: Secondary | ICD-10-CM

## 2014-11-19 MED ORDER — PEGFILGRASTIM INJECTION 6 MG/0.6ML ~~LOC~~
6.0000 mg | PREFILLED_SYRINGE | Freq: Once | SUBCUTANEOUS | Status: AC
  Administered 2014-11-19: 6 mg via SUBCUTANEOUS
  Filled 2014-11-19: qty 0.6

## 2014-11-19 NOTE — Patient Instructions (Signed)
Pegfilgrastim injection What is this medicine? PEGFILGRASTIM (peg fil GRA stim) is a long-acting granulocyte colony-stimulating factor that stimulates the growth of neutrophils, a type of white blood cell important in the body's fight against infection. It is used to reduce the incidence of fever and infection in patients with certain types of cancer who are receiving chemotherapy that affects the bone marrow. This medicine may be used for other purposes; ask your health care provider or pharmacist if you have questions. COMMON BRAND NAME(S): Neulasta What should I tell my health care provider before I take this medicine? They need to know if you have any of these conditions: -latex allergy -ongoing radiation therapy -sickle cell disease -skin reactions to acrylic adhesives (On-Body Injector only) -an unusual or allergic reaction to pegfilgrastim, filgrastim, other medicines, foods, dyes, or preservatives -pregnant or trying to get pregnant -breast-feeding How should I use this medicine? This medicine is for injection under the skin. If you get this medicine at home, you will be taught how to prepare and give the pre-filled syringe or how to use the On-body Injector. Refer to the patient Instructions for Use for detailed instructions. Use exactly as directed. Take your medicine at regular intervals. Do not take your medicine more often than directed. It is important that you put your used needles and syringes in a special sharps container. Do not put them in a trash can. If you do not have a sharps container, call your pharmacist or healthcare provider to get one. Talk to your pediatrician regarding the use of this medicine in children. Special care may be needed. Overdosage: If you think you have taken too much of this medicine contact a poison control center or emergency room at once. NOTE: This medicine is only for you. Do not share this medicine with others. What if I miss a dose? It is  important not to miss your dose. Call your doctor or health care professional if you miss your dose. If you miss a dose due to an On-body Injector failure or leakage, a new dose should be administered as soon as possible using a single prefilled syringe for manual use. What may interact with this medicine? Interactions have not been studied. Give your health care provider a list of all the medicines, herbs, non-prescription drugs, or dietary supplements you use. Also tell them if you smoke, drink alcohol, or use illegal drugs. Some items may interact with your medicine. This list may not describe all possible interactions. Give your health care provider a list of all the medicines, herbs, non-prescription drugs, or dietary supplements you use. Also tell them if you smoke, drink alcohol, or use illegal drugs. Some items may interact with your medicine. What should I watch for while using this medicine? You may need blood work done while you are taking this medicine. If you are going to need a MRI, CT scan, or other procedure, tell your doctor that you are using this medicine (On-Body Injector only). What side effects may I notice from receiving this medicine? Side effects that you should report to your doctor or health care professional as soon as possible: -allergic reactions like skin rash, itching or hives, swelling of the face, lips, or tongue -dizziness -fever -pain, redness, or irritation at site where injected -pinpoint red spots on the skin -shortness of breath or breathing problems -stomach or side pain, or pain at the shoulder -swelling -tiredness -trouble passing urine Side effects that usually do not require medical attention (report to your doctor   or health care professional if they continue or are bothersome): -bone pain -muscle pain This list may not describe all possible side effects. Call your doctor for medical advice about side effects. You may report side effects to FDA at  1-800-FDA-1088. Where should I keep my medicine? Keep out of the reach of children. Store pre-filled syringes in a refrigerator between 2 and 8 degrees C (36 and 46 degrees F). Do not freeze. Keep in carton to protect from light. Throw away this medicine if it is left out of the refrigerator for more than 48 hours. Throw away any unused medicine after the expiration date. NOTE: This sheet is a summary. It may not cover all possible information. If you have questions about this medicine, talk to your doctor, pharmacist, or health care provider.  2015, [employer]vier/Gold Standard. (2014-01-15 16:14:05)  

## 2014-11-23 ENCOUNTER — Other Ambulatory Visit: Payer: 59

## 2014-11-23 ENCOUNTER — Telehealth: Payer: Self-pay | Admitting: Internal Medicine

## 2014-11-23 NOTE — Telephone Encounter (Signed)
pt called to r/s lab...done...pt ok and aware of new d.t °

## 2014-11-24 ENCOUNTER — Other Ambulatory Visit (HOSPITAL_BASED_OUTPATIENT_CLINIC_OR_DEPARTMENT_OTHER): Payer: 59

## 2014-11-24 ENCOUNTER — Ambulatory Visit (HOSPITAL_COMMUNITY)
Admission: RE | Admit: 2014-11-24 | Discharge: 2014-11-24 | Disposition: A | Discharge status: Home / Self Care | Payer: 59 | Source: Ambulatory Visit | Attending: Physician Assistant | Admitting: Physician Assistant

## 2014-11-24 DIAGNOSIS — Z9221 Personal history of antineoplastic chemotherapy: Secondary | ICD-10-CM | POA: Diagnosis not present

## 2014-11-24 DIAGNOSIS — Z08 Encounter for follow-up examination after completed treatment for malignant neoplasm: Secondary | ICD-10-CM | POA: Insufficient documentation

## 2014-11-24 DIAGNOSIS — C3492 Malignant neoplasm of unspecified part of left bronchus or lung: Secondary | ICD-10-CM

## 2014-11-24 DIAGNOSIS — I700 Atherosclerosis of aorta: Secondary | ICD-10-CM | POA: Insufficient documentation

## 2014-11-24 DIAGNOSIS — K5730 Diverticulosis of large intestine without perforation or abscess without bleeding: Secondary | ICD-10-CM | POA: Diagnosis not present

## 2014-11-24 DIAGNOSIS — C7A1 Malignant poorly differentiated neuroendocrine tumors: Secondary | ICD-10-CM

## 2014-11-24 LAB — COMPREHENSIVE METABOLIC PANEL (CC13)
ALBUMIN: 4 g/dL (ref 3.5–5.0)
ALT: 15 U/L (ref 0–55)
AST: 13 U/L (ref 5–34)
Alkaline Phosphatase: 137 U/L (ref 40–150)
Anion Gap: 9 mEq/L (ref 3–11)
BILIRUBIN TOTAL: 0.69 mg/dL (ref 0.20–1.20)
BUN: 16.2 mg/dL (ref 7.0–26.0)
CALCIUM: 8.7 mg/dL (ref 8.4–10.4)
CO2: 26 meq/L (ref 22–29)
CREATININE: 0.7 mg/dL (ref 0.7–1.3)
Chloride: 97 mEq/L — ABNORMAL LOW (ref 98–109)
GLUCOSE: 112 mg/dL (ref 70–140)
Potassium: 4.6 mEq/L (ref 3.5–5.1)
SODIUM: 132 meq/L — AB (ref 136–145)
Total Protein: 6.3 g/dL — ABNORMAL LOW (ref 6.4–8.3)

## 2014-11-24 LAB — CBC WITH DIFFERENTIAL/PLATELET
BASO%: 1.2 % (ref 0.0–2.0)
BASOS ABS: 0.1 10*3/uL (ref 0.0–0.1)
EOS%: 0.4 % (ref 0.0–7.0)
Eosinophils Absolute: 0 10*3/uL (ref 0.0–0.5)
HCT: 29.6 % — ABNORMAL LOW (ref 38.4–49.9)
HGB: 10 g/dL — ABNORMAL LOW (ref 13.0–17.1)
LYMPH%: 11.1 % — AB (ref 14.0–49.0)
MCH: 33.9 pg — ABNORMAL HIGH (ref 27.2–33.4)
MCHC: 33.7 g/dL (ref 32.0–36.0)
MCV: 100.6 fL — AB (ref 79.3–98.0)
MONO#: 0.8 10*3/uL (ref 0.1–0.9)
MONO%: 10.5 % (ref 0.0–14.0)
NEUT%: 76.8 % — AB (ref 39.0–75.0)
NEUTROS ABS: 5.5 10*3/uL (ref 1.5–6.5)
PLATELETS: 182 10*3/uL (ref 140–400)
RBC: 2.94 10*6/uL — AB (ref 4.20–5.82)
RDW: 14.7 % — ABNORMAL HIGH (ref 11.0–14.6)
WBC: 7.1 10*3/uL (ref 4.0–10.3)
lymph#: 0.8 10*3/uL — ABNORMAL LOW (ref 0.9–3.3)

## 2014-11-24 LAB — MAGNESIUM (CC13): MAGNESIUM: 2 mg/dL (ref 1.5–2.5)

## 2014-11-24 MED ORDER — IOHEXOL 300 MG/ML  SOLN
100.0000 mL | Freq: Once | INTRAMUSCULAR | Status: AC | PRN
  Administered 2014-11-24: 100 mL via INTRAVENOUS

## 2014-11-27 ENCOUNTER — Encounter: Payer: Self-pay | Admitting: Emergency Medicine

## 2014-11-27 MED ORDER — NICOTINE 14 MG/24HR TD PT24: 14.0000 mg | MEDICATED_PATCH | Freq: Every day | TRANSDERMAL | Status: DC

## 2014-11-27 NOTE — Telephone Encounter (Signed)
1.29 mychart message from pt: Message     Can the prescription for the patch be changed to the 14 mg patch and resubmitted?         Regards,        Spencer Long    Pt last seen by RB on 1.4.16: Patient Instructions     Congratulations on stopping smoking!! We will refill urine nicotine patches 14 mg We will refill your Wellbutrin We will perform pulmonary function testing in the future to assess your lung function. We will wait to do this until after this round of chemotherapy Follow with Dr Lamonte Sakai in 3 months or sooner if you have any problems.   Per pt's chart, it appears he was given the higher dose nicotine patches while still smoking in attempts to quit and per this last ov, he has not quit though he occasionally uses vapors.  As RB's avs specifically states to refill the 14mg  patch, will go ahead and resubmit the rx as requested by pt.  Email sent to patient informing him of the above Will sign and forward to RB as FYI

## 2014-11-30 ENCOUNTER — Other Ambulatory Visit (HOSPITAL_BASED_OUTPATIENT_CLINIC_OR_DEPARTMENT_OTHER): Payer: 59

## 2014-11-30 DIAGNOSIS — C3492 Malignant neoplasm of unspecified part of left bronchus or lung: Secondary | ICD-10-CM

## 2014-11-30 DIAGNOSIS — C7A1 Malignant poorly differentiated neuroendocrine tumors: Secondary | ICD-10-CM

## 2014-11-30 LAB — COMPREHENSIVE METABOLIC PANEL (CC13)
ALBUMIN: 3.9 g/dL (ref 3.5–5.0)
ALT: 15 U/L (ref 0–55)
ANION GAP: 15 meq/L — AB (ref 3–11)
AST: 15 U/L (ref 5–34)
Alkaline Phosphatase: 98 U/L (ref 40–150)
BILIRUBIN TOTAL: 0.38 mg/dL (ref 0.20–1.20)
BUN: 12.7 mg/dL (ref 7.0–26.0)
CALCIUM: 8.7 mg/dL (ref 8.4–10.4)
CHLORIDE: 100 meq/L (ref 98–109)
CO2: 22 mEq/L (ref 22–29)
Creatinine: 0.8 mg/dL (ref 0.7–1.3)
Glucose: 102 mg/dl (ref 70–140)
Potassium: 4.9 mEq/L (ref 3.5–5.1)
SODIUM: 137 meq/L (ref 136–145)
Total Protein: 6.2 g/dL — ABNORMAL LOW (ref 6.4–8.3)

## 2014-11-30 LAB — CBC WITH DIFFERENTIAL/PLATELET
BASO%: 0.2 % (ref 0.0–2.0)
BASOS ABS: 0 10*3/uL (ref 0.0–0.1)
EOS ABS: 0 10*3/uL (ref 0.0–0.5)
EOS%: 0.5 % (ref 0.0–7.0)
HCT: 30.1 % — ABNORMAL LOW (ref 38.4–49.9)
HGB: 10.1 g/dL — ABNORMAL LOW (ref 13.0–17.1)
LYMPH%: 16.9 % (ref 14.0–49.0)
MCH: 34.5 pg — AB (ref 27.2–33.4)
MCHC: 33.6 g/dL (ref 32.0–36.0)
MCV: 102.4 fL — ABNORMAL HIGH (ref 79.3–98.0)
MONO#: 0.9 10*3/uL (ref 0.1–0.9)
MONO%: 14.9 % — AB (ref 0.0–14.0)
NEUT#: 3.9 10*3/uL (ref 1.5–6.5)
NEUT%: 67.5 % (ref 39.0–75.0)
Platelets: 220 10*3/uL (ref 140–400)
RBC: 2.94 10*6/uL — AB (ref 4.20–5.82)
RDW: 15.7 % — AB (ref 11.0–14.6)
WBC: 5.8 10*3/uL (ref 4.0–10.3)
lymph#: 1 10*3/uL (ref 0.9–3.3)

## 2014-11-30 LAB — MAGNESIUM (CC13): Magnesium: 2.2 mg/dl (ref 1.5–2.5)

## 2014-12-04 ENCOUNTER — Telehealth: Payer: Self-pay | Admitting: Emergency Medicine

## 2014-12-04 NOTE — Telephone Encounter (Signed)
Called to initiate PA for 14 mg nicotine patches.  [phone] PT ID #  [id number]  This rx has been approved through 2/5/217 i have called the pharmacy to make them aware.

## 2014-12-05 ENCOUNTER — Other Ambulatory Visit: Payer: Self-pay | Admitting: Emergency Medicine

## 2014-12-07 ENCOUNTER — Ambulatory Visit: Payer: 59 | Admitting: Nutrition

## 2014-12-07 ENCOUNTER — Ambulatory Visit (HOSPITAL_BASED_OUTPATIENT_CLINIC_OR_DEPARTMENT_OTHER): Payer: 59 | Admitting: Physician Assistant

## 2014-12-07 ENCOUNTER — Ambulatory Visit (HOSPITAL_BASED_OUTPATIENT_CLINIC_OR_DEPARTMENT_OTHER): Payer: 59

## 2014-12-07 ENCOUNTER — Telehealth: Payer: Self-pay | Admitting: Internal Medicine

## 2014-12-07 ENCOUNTER — Encounter: Payer: Self-pay | Admitting: Physician Assistant

## 2014-12-07 ENCOUNTER — Other Ambulatory Visit (HOSPITAL_BASED_OUTPATIENT_CLINIC_OR_DEPARTMENT_OTHER): Payer: 59

## 2014-12-07 VITALS — BP 147/73 | HR 81 | Temp 98.4°F | Resp 17 | Ht 69.0 in | Wt 155.3 lb

## 2014-12-07 DIAGNOSIS — C7A1 Malignant poorly differentiated neuroendocrine tumors: Secondary | ICD-10-CM

## 2014-12-07 DIAGNOSIS — C3492 Malignant neoplasm of unspecified part of left bronchus or lung: Secondary | ICD-10-CM

## 2014-12-07 DIAGNOSIS — I10 Essential (primary) hypertension: Secondary | ICD-10-CM

## 2014-12-07 DIAGNOSIS — Z5111 Encounter for antineoplastic chemotherapy: Secondary | ICD-10-CM

## 2014-12-07 DIAGNOSIS — C3490 Malignant neoplasm of unspecified part of unspecified bronchus or lung: Secondary | ICD-10-CM

## 2014-12-07 LAB — COMPREHENSIVE METABOLIC PANEL (CC13)
ALT: 14 U/L (ref 0–55)
AST: 24 U/L (ref 5–34)
Albumin: 3.9 g/dL (ref 3.5–5.0)
Alkaline Phosphatase: 88 U/L (ref 40–150)
Anion Gap: 13 mEq/L — ABNORMAL HIGH (ref 3–11)
BUN: 13.8 mg/dL (ref 7.0–26.0)
CO2: 22 mEq/L (ref 22–29)
Calcium: 8.9 mg/dL (ref 8.4–10.4)
Chloride: 101 mEq/L (ref 98–109)
Creatinine: 0.8 mg/dL (ref 0.7–1.3)
Glucose: 90 mg/dl (ref 70–140)
Potassium: 5.4 mEq/L — ABNORMAL HIGH (ref 3.5–5.1)
Sodium: 136 mEq/L (ref 136–145)
Total Bilirubin: 0.49 mg/dL (ref 0.20–1.20)
Total Protein: 6.4 g/dL (ref 6.4–8.3)

## 2014-12-07 LAB — CBC WITH DIFFERENTIAL/PLATELET
BASO%: 0.3 % (ref 0.0–2.0)
Basophils Absolute: 0 10*3/uL (ref 0.0–0.1)
EOS%: 0.4 % (ref 0.0–7.0)
Eosinophils Absolute: 0 10*3/uL (ref 0.0–0.5)
HEMATOCRIT: 33.1 % — AB (ref 38.4–49.9)
HGB: 11.7 g/dL — ABNORMAL LOW (ref 13.0–17.1)
LYMPH#: 1.4 10*3/uL (ref 0.9–3.3)
LYMPH%: 12.9 % — ABNORMAL LOW (ref 14.0–49.0)
MCH: 35 pg — AB (ref 27.2–33.4)
MCHC: 35.3 g/dL (ref 32.0–36.0)
MCV: 99.1 fL — ABNORMAL HIGH (ref 79.3–98.0)
MONO#: 1.5 10*3/uL — ABNORMAL HIGH (ref 0.1–0.9)
MONO%: 13.6 % (ref 0.0–14.0)
NEUT%: 72.8 % (ref 39.0–75.0)
NEUTROS ABS: 7.9 10*3/uL — AB (ref 1.5–6.5)
NRBC: 0 % (ref 0–0)
PLATELETS: 314 10*3/uL (ref 140–400)
RBC: 3.34 10*6/uL — ABNORMAL LOW (ref 4.20–5.82)
RDW: 15.5 % — AB (ref 11.0–14.6)
WBC: 10.9 10*3/uL — ABNORMAL HIGH (ref 4.0–10.3)

## 2014-12-07 LAB — MAGNESIUM (CC13): MAGNESIUM: 2.4 mg/dL (ref 1.5–2.5)

## 2014-12-07 MED ORDER — SODIUM CHLORIDE 0.9 % IV SOLN
Freq: Once | INTRAVENOUS | Status: AC
  Administered 2014-12-07: 13:00:00 via INTRAVENOUS

## 2014-12-07 MED ORDER — SODIUM CHLORIDE 0.9 % IV SOLN
150.0000 mg | Freq: Once | INTRAVENOUS | Status: AC
  Administered 2014-12-07: 150 mg via INTRAVENOUS
  Filled 2014-12-07: qty 5

## 2014-12-07 MED ORDER — DEXAMETHASONE SODIUM PHOSPHATE 20 MG/5ML IJ SOLN
INTRAMUSCULAR | Status: AC
  Filled 2014-12-07: qty 5

## 2014-12-07 MED ORDER — SODIUM CHLORIDE 0.9 % IV SOLN
60.0000 mg/m2 | Freq: Once | INTRAVENOUS | Status: AC
  Administered 2014-12-07: 107 mg via INTRAVENOUS
  Filled 2014-12-07: qty 107

## 2014-12-07 MED ORDER — PALONOSETRON HCL INJECTION 0.25 MG/5ML
INTRAVENOUS | Status: AC
  Filled 2014-12-07: qty 5

## 2014-12-07 MED ORDER — DEXAMETHASONE SODIUM PHOSPHATE 20 MG/5ML IJ SOLN
12.0000 mg | Freq: Once | INTRAMUSCULAR | Status: AC
  Administered 2014-12-07: 12 mg via INTRAVENOUS

## 2014-12-07 MED ORDER — MANNITOL 25 % IV SOLN
INTRAVENOUS | Status: DC
  Administered 2014-12-07: 11:00:00 via INTRAVENOUS
  Filled 2014-12-07: qty 1000

## 2014-12-07 MED ORDER — SODIUM CHLORIDE 0.9 % IV SOLN
120.0000 mg/m2 | Freq: Once | INTRAVENOUS | Status: AC
  Administered 2014-12-07: 210 mg via INTRAVENOUS
  Filled 2014-12-07: qty 10.5

## 2014-12-07 MED ORDER — PALONOSETRON HCL INJECTION 0.25 MG/5ML
0.2500 mg | Freq: Once | INTRAVENOUS | Status: AC
  Administered 2014-12-07: 0.25 mg via INTRAVENOUS

## 2014-12-07 NOTE — Progress Notes (Signed)
Brief nutrition follow-up completed with patient and wife, during chemotherapy for small cell lung cancer. Weight is stable and documented as 155.3 pounds February 8. Potassium increased and documented as 5.4. Wife has been providing high iron foods along with source of vitamin C to improve absorption.  Nutrition diagnosis: Food and nutrition related knowledge deficit has improved.  Intervention: Patient to continue small frequent meals for weight maintenance. Provided education on foods high in potassium.  Fact sheet was given. Questions answered and teach back method used.  Monitoring, evaluation, goals: Patient is maintaining his weight and consuming adequate calories.  Next visit: Patient will contact me for questions or concerns.  **Disclaimer: This note was dictated with voice recognition software. Similar sounding words can inadvertently be transcribed and this note may contain transcription errors which may not have been corrected upon publication of note.**

## 2014-12-07 NOTE — Patient Instructions (Signed)
Your recent restaging CT scan revealed further improvement in your disease Continue weekly labs as scheduled Follow-up in 3 weeks prior to next scheduled cycle of chemotherapy Follow-up with your dermatologist as scheduled

## 2014-12-07 NOTE — Progress Notes (Addendum)
No images are attached to the encounter. No scans are attached to the encounter. No scans are attached to the encounter. [facility] VISIT PROGRESS NOTE  [facility], [name] [id number]  DIAGNOSIS: Small cell lung cancer   Staging form: Lung, AJCC 7th Edition     Clinical: T2, N1, M0 - Unsigned Extensive Stage  PRIOR THERAPY: none  CURRENT THERAPY: Systemic chemotherapy with cisplatin 60 MG/M2 on day 1 and etoposide 120 MG/M2 on days 1, 2 and 3 with Neulasta support on day 4. Status post 4 cycles.  INTERVAL HISTORY: [facility] 64 y.o. male returns for a scheduled regular symptom management visit for followup of his recently diagnosed small cell lung cancer. He is accompanied by his wife today. Overall he continues to tolerate his chemotherapy with cisplatin and etoposide with Neulasta support relatively well. He denied any issues with shortness of breath, cough, hemoptysis, fever, chills, nausea or vomiting. He is drinking at least 64 ounces of fluids daily is sleeping well and has a good appetite. He reports a rash that developed on his right wrist and left forearm in the past week. He is followed by Dr. Isidor Holts, his dermatologist and is currently using clobetasol cream per her instructions. He reports that she recently took a biopsy of this rash on his right wrist. He is to return in one week to have the sutures removed. He presents to proceed with cycle #5. He recently had a restaging CT scan of the chest, abdomen and pelvis and presents to discuss the results.  MEDICAL HISTORY: Past Medical History  Diagnosis Date  . Hypertension     under control, has been on med. x 15-20 yrs.  [name] GERD (gastroesophageal reflux disease)   . Infection of elbow     right-no infection at present , but has skin lesions present  . Sarcoidosis     "tends to have skin flareups" and presently experiencing now"feet, legs and arms"  .  Chronic hyponatremia     Dr. Buddy Duty follows  . Lung cancer     ALLERGIES:  is allergic to varenicline; isothiazolinone chloride; oxycodone; and quaternium-15.  MEDICATIONS:  Current Outpatient Prescriptions  Medication Sig Dispense Refill  . amLODipine (NORVASC) 10 MG tablet Take 10 mg by mouth every morning.     [name] aspirin EC 81 MG tablet Take 81 mg by mouth every morning.     [name] atenolol (TENORMIN) 25 MG tablet Take 25 mg by mouth 2 (two) times daily.      . Azilsartan Medoxomil (EDARBI) 80 MG TABS Take 1 tablet by mouth every morning.    [name] buPROPion (WELLBUTRIN SR) 150 MG 12 hr tablet Take 1 tablet (150 mg total) by mouth 2 (two) times daily. 60 tablet 2  . clobetasol cream (TEMOVATE) 4.74 % Apply 1 application topically 2 (two) times daily as needed.    . dicyclomine (BENTYL) 20 MG tablet Take 20 mg by mouth every 4 (four) hours as needed for spasms.     . folic acid (FOLVITE) 1 MG tablet Take 1 mg by mouth 2 (two) times daily.    [name] morphine (MSIR) 15 MG tablet Take 1 tablet (15 mg total) by mouth every 6 (six) hours as needed for severe pain. 30 tablet 0  . Multiple Vitamins-Minerals (MULTIVITAMIN ADULTS 50+ PO) Take 1 tablet by mouth every morning.     . nicotine (NICODERM CQ - DOSED IN MG/24 HOURS) 14  mg/24hr patch Place 1 patch (14 mg total) onto the skin daily. 28 patch 2  . omeprazole (PRILOSEC) 40 MG capsule Take 40 mg by mouth every morning.    . Probiotic Product (PROBIOTIC DAILY PO) Take 1 tablet by mouth every morning.     . prochlorperazine (COMPAZINE) 10 MG tablet Take 1 tablet (10 mg total) by mouth every 6 (six) hours as needed for nausea. 60 tablet 1  . sodium chloride 1 G tablet Take 1 g by mouth 3 (three) times daily.    . white petrolatum (VASELINE) GEL Apply 1 application topically daily as needed for lip care or dry skin.    [name] buPROPion (WELLBUTRIN SR) 150 MG 12 hr tablet TAKE 1 TABLET BY MOUTH DAILY FOR 1 WEEK THEN INCREASE TO 1 TABLET TWICE A DAY 60 tablet 2  .  ondansetron (ZOFRAN) 8 MG tablet Take 1 tablet (8 mg total) by mouth every 8 (eight) hours as needed for nausea or vomiting (start on 3rd day after chemo). (Patient not taking: Reported on 11/02/2014) 20 tablet 1   No current facility-administered medications for this visit.   Facility-Administered Medications Ordered in Other Visits  Medication Dose Route Frequency Provider Last Rate Last Dose  . CISplatin (PLATINOL) 107 mg in sodium chloride 0.9 % 500 mL chemo infusion  60 mg/m2 (Treatment Plan Actual) Intravenous Once Curt Bears, MD 607 mL/hr at 12/07/14 1516 107 mg at 12/07/14 1516  . dextrose 5 % and 0.45% NaCl 1,000 mL with magnesium sulfate 12 mEq, mannitol 12.5 g infusion   Intravenous Continuous Carlton Adam, PA-C 250 mL/hr at 12/07/14 1117      SURGICAL HISTORY:  Past Surgical History  Procedure Laterality Date  . Cholecystectomy  05/2006  . Ulnar nerve repair  01/2008    right; decompression  . Carpal tunnel release  01/2008    right  . Eye surgery  12/2008    correction ectropion, release band, wedge exc., shortening  . I&d extremity  09/12/2011    Procedure: IRRIGATION AND DEBRIDEMENT EXTREMITY;  Surgeon: Tennis Must;  Location: [facility];  Service: Orthopedics;  Laterality: Right;  I&d RIGHT OLECRANON BURSSA   . Tonsillectomy    . Endobronchial ultrasound Bilateral 08/24/2014    Procedure: ENDOBRONCHIAL ULTRASOUND;  Surgeon: Collene Gobble, MD;  Location: [facility];  Service: Cardiopulmonary;  Laterality: Bilateral;    REVIEW OF SYSTEMS:  Review of Systems  Constitutional: Negative for fever, chills, weight loss, malaise/fatigue and diaphoresis.  HENT: Negative for congestion, ear discharge, ear pain, hearing loss, nosebleeds, sore throat and tinnitus.   Eyes: Negative for blurred vision, double vision, photophobia, pain, discharge and redness.  Respiratory: Negative for cough, hemoptysis, sputum production, shortness of breath, wheezing and  stridor.   Cardiovascular: Negative for chest pain, palpitations, orthopnea, claudication, leg swelling and PND.  Gastrointestinal: Negative for heartburn, nausea, vomiting, abdominal pain, diarrhea, constipation, blood in stool and melena.  Genitourinary: Negative.   Musculoskeletal: Negative.   Skin: Positive for rash.  Neurological: Negative for dizziness, tingling, focal weakness, seizures, weakness and headaches.  Endo/Heme/Allergies: Does not bruise/bleed easily.  Psychiatric/Behavioral: Negative for depression. The patient is not nervous/anxious and does not have insomnia.      PHYSICAL EXAMINATION: Physical Exam  Constitutional: He is oriented to person, place, and time and well-developed, well-nourished, and in no distress.  HENT:  Head: Normocephalic and atraumatic.  Mouth/Throat: Oropharynx is clear and moist.  Eyes: Conjunctivae and EOM are normal. Pupils are equal,  round, and reactive to light.  Neck: Normal range of motion. Neck supple.  Cardiovascular: Normal rate, regular rhythm, normal heart sounds and intact distal pulses.   Pulmonary/Chest: Effort normal and breath sounds normal.  Abdominal: Soft. Bowel sounds are normal.  Musculoskeletal: Normal range of motion.  Neurological: He is alert and oriented to person, place, and time. Gait normal.  Skin: Skin is warm and dry. Rash noted.  Approximately one to one and half centimeter circular erythematous slightly raised eruption medial aspect of the right wrist. Medial aspect mid left forearm ovoid erythematous raised plaque-like lesions with smaller similar satellite adjacent lesion.  Psychiatric: Mood, memory, affect and judgment normal.  Vitals reviewed.   ECOG PERFORMANCE STATUS: 1 - Symptomatic but completely ambulatory  Blood pressure 147/73, pulse 81, temperature 98.4 F (36.9 C), temperature source Oral, resp. rate 17, height 5\' 9"  (1.753 m), weight 155 lb 4.8 oz (70.444 kg), SpO2 97 %.  LABORATORY DATA: Lab  Results  Component Value Date   WBC 10.9* 12/07/2014   HGB 11.7* 12/07/2014   HCT 33.1* 12/07/2014   MCV 99.1* 12/07/2014   PLT 314 12/07/2014      Chemistry      Component Value Date/Time   NA 136 12/07/2014 0947   NA 125* 09/12/2011 1302   K 5.4* 12/07/2014 0947   K 5.5* 09/12/2011 1302   CL 95* 09/12/2011 1302   CO2 22 12/07/2014 0947   CO2 26 12/25/2008 1425   BUN 13.8 12/07/2014 0947   BUN 19 09/12/2011 1302   CREATININE 0.8 12/07/2014 0947   CREATININE 1.00 09/12/2011 1302      Component Value Date/Time   CALCIUM 8.9 12/07/2014 0947   CALCIUM 9.1 12/25/2008 1425   ALKPHOS 88 12/07/2014 0947   AST 24 12/07/2014 0947   ALT 14 12/07/2014 0947   BILITOT 0.49 12/07/2014 0947       RADIOGRAPHIC STUDIES:  Ct Chest W Contrast  11/24/2014   CLINICAL DATA:  Small cell lung cancer diagnosed November 2015 with only chemotherapy. Subsequent treatment evaluation.  EXAM: CT CHEST, ABDOMEN, AND PELVIS WITH CONTRAST  TECHNIQUE: Multidetector CT imaging of the chest, abdomen and pelvis was performed following the standard protocol during bolus administration of intravenous contrast.  CONTRAST:  140mL OMNIPAQUE IOHEXOL 300 MG/ML  SOLN  COMPARISON:  CT 10/21/2014, PET-CT 09/11/2014  FINDINGS: CT CHEST FINDINGS  CT CHEST FINDINGS  Mediastinum/Nodes: No axillary supraclavicular lymphadenopathy. No mediastinal or hilar lymphadenopathy. No pericardial fluid. Central pulmonary embolism. Esophagus is normal.  Small 10 mm nodule in the left infrahilar location (image 39, series 2) is not changed from 13 mm on prior.  Lungs/Pleura: No suspicious pulmonary nodules. Linear pleural parenchymal thickening at the left lung base is unchanged. No pleural nodularity.  Musculoskeletal: No aggressive osseous lesion.  CT ABDOMEN AND PELVIS FINDINGS  Hepatobiliary: No focal hepatic lesion. Post cholecystectomy anatomy.  Pancreas: Pancreas is normal. No ductal dilatation. No pancreatic inflammation.  Spleen:  Normal spleen.  Adrenals/Urinary Tract: Adrenal glands are normal. Several calcifications in the right renal hilum appear vascular. No enhancing renal lesion. Ureters and bladder normal. There is small diverticulum along the posterior left aspect bladder.  Stomach/Bowel: Stomach, small bowel, appendix, cecum normal. There are diverticula of the sigmoid colon without acute inflammation. Stool in the rectum.  Vascular/Lymphatic: Abdominal aorta is normal caliber with atherosclerotic calcification. There is no retroperitoneal or periportal lymphadenopathy. No pelvic lymphadenopathy.  Reproductive: Prostate gland is normal.  No pelvic lymphadenopathy.  Other:  No peritoneal  nodularity  Musculoskeletal: No aggressive osseous lesion.  IMPRESSION: 1. No evidence of small cell lung cancer recurrence in the chest, abdomen, or pelvis. 2. Stable small left infrahilar nodule at site of prior hypermetabolic mass . 3. Sigmoid diverticulosis without acute diverticulitis. 4. Atherosclerotic calcifications of aorta and its branches.   Electronically Signed   By: Suzy Bouchard M.D.   On: 11/24/2014 09:17   Ct Abdomen Pelvis W Contrast  11/24/2014   CLINICAL DATA:  Small cell lung cancer diagnosed November 2015 with only chemotherapy. Subsequent treatment evaluation.  EXAM: CT CHEST, ABDOMEN, AND PELVIS WITH CONTRAST  TECHNIQUE: Multidetector CT imaging of the chest, abdomen and pelvis was performed following the standard protocol during bolus administration of intravenous contrast.  CONTRAST:  155mL OMNIPAQUE IOHEXOL 300 MG/ML  SOLN  COMPARISON:  CT 10/21/2014, PET-CT 09/11/2014  FINDINGS: CT CHEST FINDINGS  CT CHEST FINDINGS  Mediastinum/Nodes: No axillary supraclavicular lymphadenopathy. No mediastinal or hilar lymphadenopathy. No pericardial fluid. Central pulmonary embolism. Esophagus is normal.  Small 10 mm nodule in the left infrahilar location (image 39, series 2) is not changed from 13 mm on prior.  Lungs/Pleura: No  suspicious pulmonary nodules. Linear pleural parenchymal thickening at the left lung base is unchanged. No pleural nodularity.  Musculoskeletal: No aggressive osseous lesion.  CT ABDOMEN AND PELVIS FINDINGS  Hepatobiliary: No focal hepatic lesion. Post cholecystectomy anatomy.  Pancreas: Pancreas is normal. No ductal dilatation. No pancreatic inflammation.  Spleen: Normal spleen.  Adrenals/Urinary Tract: Adrenal glands are normal. Several calcifications in the right renal hilum appear vascular. No enhancing renal lesion. Ureters and bladder normal. There is small diverticulum along the posterior left aspect bladder.  Stomach/Bowel: Stomach, small bowel, appendix, cecum normal. There are diverticula of the sigmoid colon without acute inflammation. Stool in the rectum.  Vascular/Lymphatic: Abdominal aorta is normal caliber with atherosclerotic calcification. There is no retroperitoneal or periportal lymphadenopathy. No pelvic lymphadenopathy.  Reproductive: Prostate gland is normal.  No pelvic lymphadenopathy.  Other:  No peritoneal nodularity  Musculoskeletal: No aggressive osseous lesion.  IMPRESSION: 1. No evidence of small cell lung cancer recurrence in the chest, abdomen, or pelvis. 2. Stable small left infrahilar nodule at site of prior hypermetabolic mass . 3. Sigmoid diverticulosis without acute diverticulitis. 4. Atherosclerotic calcifications of aorta and its branches.   Electronically Signed   By: Suzy Bouchard M.D.   On: 11/24/2014 09:17     ASSESSMENT/PLAN:  No problem-specific assessment & plan notes found for this encounter. No problem-specific assessment & plan notes found for this encounter. Patient was seen and examined with Dr. [name]. He was advised to continue using his current topical creams as prescribed by his dermatologist. He was seen by his dermatologist who did a biopsy of the rash on his right wrist. Pathology is pending. He is to return in one week to have sutures removed. His  recent repeat staging CT scan revealed further improvement in his disease. He'll proceed with cycle #5 of his systemic chemotherapy with cisplatin and etoposide with Neulasta support today as scheduled. He will continue with weekly labs in the interim. He will return in 3 weeks for another symptom management visit prior to cycle #6.  All questions were answered. The patient knows to call the clinic with any problems, questions or concerns. We can certainly see the patient much sooner if necessary.  Carlton Adam, PA-C 12/07/2014  ADDENDUM: Hematology/Oncology Attending: I had a face to face encounter with the patient today. I recommended his care plan. This  is a very pleasant 64 years old white male with extensive stage small cell lung cancer who is currently undergoing systemic chemotherapy with cisplatin and etoposide status post 4 cycles with almost complete resolution of his disease. I discussed the scan results with the patient and his wife. He is tolerating his treatment fairly well with no significant adverse effects. I recommended for the patient to proceed with 2 more cycles of his treatment. He will start cycle #5 today. The patient would come back for follow-up visit in 3 weeks with the last cycle of his treatment. He was advised to call immediately if he has any concerning symptoms in the interval.  Disclaimer: This note was dictated with voice recognition software. Similar sounding words can inadvertently be transcribed and may be missed upon review. Eilleen Kempf., MD 12/07/2014

## 2014-12-07 NOTE — Patient Instructions (Signed)
[facility] Discharge Instructions for Patients Receiving Chemotherapy  Today you received the following chemotherapy agents Cisplatin, Etoposide  To help prevent nausea and vomiting after your treatment, we encourage you to take your nausea medication as directed/prescribed   If you develop nausea and vomiting that is not controlled by your nausea medication, call the clinic.   BELOW ARE SYMPTOMS THAT SHOULD BE REPORTED IMMEDIATELY:  *FEVER GREATER THAN 100.5 F  *CHILLS WITH OR WITHOUT FEVER  NAUSEA AND VOMITING THAT IS NOT CONTROLLED WITH YOUR NAUSEA MEDICATION  *UNUSUAL SHORTNESS OF BREATH  *UNUSUAL BRUISING OR BLEEDING  TENDERNESS IN MOUTH AND THROAT WITH OR WITHOUT PRESENCE OF ULCERS  *URINARY PROBLEMS  *BOWEL PROBLEMS  UNUSUAL RASH Items with * indicate a potential emergency and should be followed up as soon as possible.  Feel free to call the clinic you have any questions or concerns. The clinic phone number is [phone].

## 2014-12-07 NOTE — Telephone Encounter (Signed)
gv and printed appt sched adna vs for pt for Feb and March...s.ed added tx.

## 2014-12-08 ENCOUNTER — Ambulatory Visit (HOSPITAL_BASED_OUTPATIENT_CLINIC_OR_DEPARTMENT_OTHER): Payer: 59

## 2014-12-08 DIAGNOSIS — Z5111 Encounter for antineoplastic chemotherapy: Secondary | ICD-10-CM

## 2014-12-08 DIAGNOSIS — C7A1 Malignant poorly differentiated neuroendocrine tumors: Secondary | ICD-10-CM

## 2014-12-08 DIAGNOSIS — C3492 Malignant neoplasm of unspecified part of left bronchus or lung: Secondary | ICD-10-CM

## 2014-12-08 MED ORDER — SODIUM CHLORIDE 0.9 % IV SOLN
Freq: Once | INTRAVENOUS | Status: AC
  Administered 2014-12-08: 08:00:00 via INTRAVENOUS

## 2014-12-08 MED ORDER — ETOPOSIDE CHEMO INJECTION 1 GM/50ML
120.0000 mg/m2 | Freq: Once | INTRAVENOUS | Status: AC
  Administered 2014-12-08: 210 mg via INTRAVENOUS
  Filled 2014-12-08: qty 10.5

## 2014-12-08 MED ORDER — DEXAMETHASONE SODIUM PHOSPHATE 10 MG/ML IJ SOLN
INTRAMUSCULAR | Status: AC
  Filled 2014-12-08: qty 1

## 2014-12-08 MED ORDER — DEXAMETHASONE SODIUM PHOSPHATE 10 MG/ML IJ SOLN
10.0000 mg | Freq: Once | INTRAMUSCULAR | Status: AC
  Administered 2014-12-08: 10 mg via INTRAVENOUS

## 2014-12-08 NOTE — Patient Instructions (Signed)
[facility] Discharge Instructions for Patients Receiving Chemotherapy  Today you received the following chemotherapy agents Etoposide  To help prevent nausea and vomiting after your treatment, we encourage you to take your nausea medication as prescribed.   If you develop nausea and vomiting that is not controlled by your nausea medication, call the clinic.   BELOW ARE SYMPTOMS THAT SHOULD BE REPORTED IMMEDIATELY:  *FEVER GREATER THAN 100.5 F  *CHILLS WITH OR WITHOUT FEVER  NAUSEA AND VOMITING THAT IS NOT CONTROLLED WITH YOUR NAUSEA MEDICATION  *UNUSUAL SHORTNESS OF BREATH  *UNUSUAL BRUISING OR BLEEDING  TENDERNESS IN MOUTH AND THROAT WITH OR WITHOUT PRESENCE OF ULCERS  *URINARY PROBLEMS  *BOWEL PROBLEMS  UNUSUAL RASH Items with * indicate a potential emergency and should be followed up as soon as possible.  Feel free to call the clinic you have any questions or concerns. The clinic phone number is [phone].

## 2014-12-09 ENCOUNTER — Ambulatory Visit (HOSPITAL_BASED_OUTPATIENT_CLINIC_OR_DEPARTMENT_OTHER): Payer: 59

## 2014-12-09 DIAGNOSIS — C3492 Malignant neoplasm of unspecified part of left bronchus or lung: Secondary | ICD-10-CM

## 2014-12-09 DIAGNOSIS — Z5111 Encounter for antineoplastic chemotherapy: Secondary | ICD-10-CM

## 2014-12-09 DIAGNOSIS — C7A1 Malignant poorly differentiated neuroendocrine tumors: Secondary | ICD-10-CM

## 2014-12-09 MED ORDER — DEXAMETHASONE SODIUM PHOSPHATE 10 MG/ML IJ SOLN
INTRAMUSCULAR | Status: AC
  Filled 2014-12-09: qty 1

## 2014-12-09 MED ORDER — SODIUM CHLORIDE 0.9 % IV SOLN
Freq: Once | INTRAVENOUS | Status: AC
  Administered 2014-12-09: 08:00:00 via INTRAVENOUS

## 2014-12-09 MED ORDER — SODIUM CHLORIDE 0.9 % IV SOLN
120.0000 mg/m2 | Freq: Once | INTRAVENOUS | Status: AC
  Administered 2014-12-09: 210 mg via INTRAVENOUS
  Filled 2014-12-09: qty 10.5

## 2014-12-09 MED ORDER — DEXAMETHASONE SODIUM PHOSPHATE 10 MG/ML IJ SOLN
10.0000 mg | Freq: Once | INTRAMUSCULAR | Status: AC
  Administered 2014-12-09: 10 mg via INTRAVENOUS

## 2014-12-09 NOTE — Patient Instructions (Signed)
[facility] Discharge Instructions for Patients Receiving Chemotherapy  Today you received the following chemotherapy agents Etoposide.  To help prevent nausea and vomiting after your treatment, we encourage you to take your nausea medication as prescribed.   If you develop nausea and vomiting that is not controlled by your nausea medication, call the clinic.   BELOW ARE SYMPTOMS THAT SHOULD BE REPORTED IMMEDIATELY:  *FEVER GREATER THAN 100.5 F  *CHILLS WITH OR WITHOUT FEVER  NAUSEA AND VOMITING THAT IS NOT CONTROLLED WITH YOUR NAUSEA MEDICATION  *UNUSUAL SHORTNESS OF BREATH  *UNUSUAL BRUISING OR BLEEDING  TENDERNESS IN MOUTH AND THROAT WITH OR WITHOUT PRESENCE OF ULCERS  *URINARY PROBLEMS  *BOWEL PROBLEMS  UNUSUAL RASH Items with * indicate a potential emergency and should be followed up as soon as possible.  Feel free to call the clinic you have any questions or concerns. The clinic phone number is [phone].

## 2014-12-10 ENCOUNTER — Ambulatory Visit (HOSPITAL_BASED_OUTPATIENT_CLINIC_OR_DEPARTMENT_OTHER): Payer: 59

## 2014-12-10 DIAGNOSIS — C3492 Malignant neoplasm of unspecified part of left bronchus or lung: Secondary | ICD-10-CM

## 2014-12-10 DIAGNOSIS — Z5189 Encounter for other specified aftercare: Secondary | ICD-10-CM

## 2014-12-10 DIAGNOSIS — C7A1 Malignant poorly differentiated neuroendocrine tumors: Secondary | ICD-10-CM

## 2014-12-10 MED ORDER — PEGFILGRASTIM INJECTION 6 MG/0.6ML ~~LOC~~
6.0000 mg | PREFILLED_SYRINGE | Freq: Once | SUBCUTANEOUS | Status: AC
  Administered 2014-12-10: 6 mg via SUBCUTANEOUS
  Filled 2014-12-10: qty 0.6

## 2014-12-11 ENCOUNTER — Other Ambulatory Visit: Payer: Self-pay | Admitting: Physician Assistant

## 2014-12-13 ENCOUNTER — Other Ambulatory Visit: Payer: Self-pay | Admitting: Physician Assistant

## 2014-12-14 ENCOUNTER — Other Ambulatory Visit: Payer: Self-pay

## 2014-12-14 ENCOUNTER — Other Ambulatory Visit (HOSPITAL_BASED_OUTPATIENT_CLINIC_OR_DEPARTMENT_OTHER): Payer: 59

## 2014-12-14 DIAGNOSIS — C3492 Malignant neoplasm of unspecified part of left bronchus or lung: Secondary | ICD-10-CM

## 2014-12-14 DIAGNOSIS — C7A1 Malignant poorly differentiated neuroendocrine tumors: Secondary | ICD-10-CM

## 2014-12-14 DIAGNOSIS — C3400 Malignant neoplasm of unspecified main bronchus: Secondary | ICD-10-CM

## 2014-12-14 LAB — CBC WITH DIFFERENTIAL/PLATELET
BASO%: 0.5 % (ref 0.0–2.0)
BASOS ABS: 0.1 10*3/uL (ref 0.0–0.1)
EOS ABS: 0 10*3/uL (ref 0.0–0.5)
EOS%: 0.1 % (ref 0.0–7.0)
HCT: 28.2 % — ABNORMAL LOW (ref 38.4–49.9)
HGB: 9.4 g/dL — ABNORMAL LOW (ref 13.0–17.1)
LYMPH%: 8.1 % — AB (ref 14.0–49.0)
MCH: 34.2 pg — ABNORMAL HIGH (ref 27.2–33.4)
MCHC: 33.3 g/dL (ref 32.0–36.0)
MCV: 102.8 fL — ABNORMAL HIGH (ref 79.3–98.0)
MONO#: 0.8 10*3/uL (ref 0.1–0.9)
MONO%: 6.4 % (ref 0.0–14.0)
NEUT#: 10.3 10*3/uL — ABNORMAL HIGH (ref 1.5–6.5)
NEUT%: 84.9 % — ABNORMAL HIGH (ref 39.0–75.0)
PLATELETS: 209 10*3/uL (ref 140–400)
RBC: 2.74 10*6/uL — ABNORMAL LOW (ref 4.20–5.82)
RDW: 14.6 % (ref 11.0–14.6)
WBC: 12.2 10*3/uL — ABNORMAL HIGH (ref 4.0–10.3)
lymph#: 1 10*3/uL (ref 0.9–3.3)

## 2014-12-14 LAB — COMPREHENSIVE METABOLIC PANEL (CC13)
ALT: 12 U/L (ref 0–55)
AST: 12 U/L (ref 5–34)
Albumin: 3.8 g/dL (ref 3.5–5.0)
Alkaline Phosphatase: 134 U/L (ref 40–150)
Anion Gap: 7 meq/L (ref 3–11)
BUN: 18.1 mg/dL (ref 7.0–26.0)
CO2: 26 meq/L (ref 22–29)
Calcium: 8.9 mg/dL (ref 8.4–10.4)
Chloride: 96 meq/L — ABNORMAL LOW (ref 98–109)
Creatinine: 0.7 mg/dL (ref 0.7–1.3)
EGFR: >90 ml/min/1.73 m2
Glucose: 96 mg/dL (ref 70–140)
Potassium: 5 meq/L (ref 3.5–5.1)
Sodium: 130 meq/L — ABNORMAL LOW (ref 136–145)
Total Bilirubin: 0.43 mg/dL (ref 0.20–1.20)
Total Protein: 6.1 g/dL — ABNORMAL LOW (ref 6.4–8.3)

## 2014-12-14 LAB — MAGNESIUM (CC13): Magnesium: 2.2 mg/dL (ref 1.5–2.5)

## 2014-12-14 MED ORDER — MORPHINE SULFATE 15 MG PO TABS: 15.0000 mg | ORAL_TABLET | Freq: Four times a day (QID) | ORAL | Status: DC | PRN

## 2014-12-14 NOTE — Telephone Encounter (Signed)
S/w pt. He was unable to pick up Rx today w/his lab appt b/c it was not prepared. There was an allergy alert d/t oxycodone allergy. Pt is not allergic to MSIR. Rx prepared for signature. Pt will pick up tomorrow.

## 2014-12-15 ENCOUNTER — Encounter: Payer: 59 | Admitting: Nurse Practitioner

## 2014-12-15 ENCOUNTER — Telehealth: Payer: Self-pay | Admitting: *Deleted

## 2014-12-15 NOTE — Telephone Encounter (Signed)
THE LEFT SIDE OF PT.'S FACE IN FRONT OF HIS EAR FROM CHEEK BONE DOWN IS SWOLLEN. "IT LOOKS LIKE A KNOT". NO REDNESS OR WARMTH. NO PROBLEMS BREATHING. ON CALL PHYSICIAN INSTRUCTED PT. TO TAKE BENADRYL 25 MG AND ICE TO FACE. ALSO TO CALL THIS MORNING TO BE SEEN. PT. TOOK BENADRYL BUT DID NOT USE THE ICE. THIS MORNING THERE IS LITTLE CHANGE IN PT.'S FACE. PT. HAS AN APPOINTMENT WITH THE DERMATOLOGIST TODAY AT 10:40AM. INSTRUCTED TO COME TO THIS OFFICE AFTER HIS APPOINTMENT.

## 2014-12-18 ENCOUNTER — Other Ambulatory Visit: Payer: Self-pay | Admitting: Medical Oncology

## 2014-12-18 DIAGNOSIS — C3490 Malignant neoplasm of unspecified part of unspecified bronchus or lung: Secondary | ICD-10-CM

## 2014-12-21 ENCOUNTER — Other Ambulatory Visit (HOSPITAL_BASED_OUTPATIENT_CLINIC_OR_DEPARTMENT_OTHER): Payer: 59

## 2014-12-21 DIAGNOSIS — C3490 Malignant neoplasm of unspecified part of unspecified bronchus or lung: Secondary | ICD-10-CM

## 2014-12-21 DIAGNOSIS — C7A1 Malignant poorly differentiated neuroendocrine tumors: Secondary | ICD-10-CM

## 2014-12-21 LAB — CBC WITH DIFFERENTIAL/PLATELET
BASO%: 0.6 % (ref 0.0–2.0)
Basophils Absolute: 0.1 10*3/uL (ref 0.0–0.1)
EOS ABS: 0 10*3/uL (ref 0.0–0.5)
EOS%: 0.2 % (ref 0.0–7.0)
HCT: 31.9 % — ABNORMAL LOW (ref 38.4–49.9)
HEMOGLOBIN: 10.7 g/dL — AB (ref 13.0–17.1)
LYMPH%: 11.5 % — ABNORMAL LOW (ref 14.0–49.0)
MCH: 34.5 pg — ABNORMAL HIGH (ref 27.2–33.4)
MCHC: 33.4 g/dL (ref 32.0–36.0)
MCV: 103.1 fL — ABNORMAL HIGH (ref 79.3–98.0)
MONO#: 1.4 10*3/uL — AB (ref 0.1–0.9)
MONO%: 11.9 % (ref 0.0–14.0)
NEUT#: 8.8 10*3/uL — ABNORMAL HIGH (ref 1.5–6.5)
NEUT%: 75.8 % — AB (ref 39.0–75.0)
Platelets: 199 10*3/uL (ref 140–400)
RBC: 3.1 10*6/uL — ABNORMAL LOW (ref 4.20–5.82)
RDW: 15.3 % — AB (ref 11.0–14.6)
WBC: 11.7 10*3/uL — ABNORMAL HIGH (ref 4.0–10.3)
lymph#: 1.3 10*3/uL (ref 0.9–3.3)

## 2014-12-21 LAB — COMPREHENSIVE METABOLIC PANEL (CC13)
ALT: 11 U/L (ref 0–55)
AST: 15 U/L (ref 5–34)
Albumin: 3.9 g/dL (ref 3.5–5.0)
Alkaline Phosphatase: 105 U/L (ref 40–150)
Anion Gap: 10 mEq/L (ref 3–11)
BILIRUBIN TOTAL: 0.31 mg/dL (ref 0.20–1.20)
BUN: 10.5 mg/dL (ref 7.0–26.0)
CO2: 27 meq/L (ref 22–29)
Calcium: 9.2 mg/dL (ref 8.4–10.4)
Chloride: 99 mEq/L (ref 98–109)
Creatinine: 0.8 mg/dL (ref 0.7–1.3)
GLUCOSE: 95 mg/dL (ref 70–140)
POTASSIUM: 4.9 meq/L (ref 3.5–5.1)
Sodium: 137 mEq/L (ref 136–145)
Total Protein: 6.2 g/dL — ABNORMAL LOW (ref 6.4–8.3)

## 2014-12-25 ENCOUNTER — Other Ambulatory Visit: Payer: Self-pay | Admitting: Medical Oncology

## 2014-12-25 DIAGNOSIS — C3490 Malignant neoplasm of unspecified part of unspecified bronchus or lung: Secondary | ICD-10-CM

## 2014-12-28 ENCOUNTER — Encounter: Payer: Self-pay | Admitting: Internal Medicine

## 2014-12-28 ENCOUNTER — Ambulatory Visit (HOSPITAL_BASED_OUTPATIENT_CLINIC_OR_DEPARTMENT_OTHER): Payer: 59 | Admitting: Internal Medicine

## 2014-12-28 ENCOUNTER — Other Ambulatory Visit (HOSPITAL_BASED_OUTPATIENT_CLINIC_OR_DEPARTMENT_OTHER): Payer: 59

## 2014-12-28 ENCOUNTER — Telehealth: Payer: Self-pay | Admitting: Internal Medicine

## 2014-12-28 ENCOUNTER — Ambulatory Visit (HOSPITAL_BASED_OUTPATIENT_CLINIC_OR_DEPARTMENT_OTHER): Payer: 59

## 2014-12-28 VITALS — BP 133/85 | HR 77 | Temp 98.0°F | Resp 18 | Ht 69.0 in | Wt 157.9 lb

## 2014-12-28 DIAGNOSIS — Z5111 Encounter for antineoplastic chemotherapy: Secondary | ICD-10-CM

## 2014-12-28 DIAGNOSIS — C7A1 Malignant poorly differentiated neuroendocrine tumors: Secondary | ICD-10-CM

## 2014-12-28 DIAGNOSIS — C3492 Malignant neoplasm of unspecified part of left bronchus or lung: Secondary | ICD-10-CM

## 2014-12-28 DIAGNOSIS — C3490 Malignant neoplasm of unspecified part of unspecified bronchus or lung: Secondary | ICD-10-CM

## 2014-12-28 LAB — CBC WITH DIFFERENTIAL/PLATELET
BASO%: 0.3 % (ref 0.0–2.0)
Basophils Absolute: 0 10*3/uL (ref 0.0–0.1)
EOS ABS: 0 10*3/uL (ref 0.0–0.5)
EOS%: 0.3 % (ref 0.0–7.0)
HCT: 31.4 % — ABNORMAL LOW (ref 38.4–49.9)
HGB: 10.9 g/dL — ABNORMAL LOW (ref 13.0–17.1)
LYMPH#: 1.2 10*3/uL (ref 0.9–3.3)
LYMPH%: 12.3 % — ABNORMAL LOW (ref 14.0–49.0)
MCH: 35 pg — ABNORMAL HIGH (ref 27.2–33.4)
MCHC: 34.7 g/dL (ref 32.0–36.0)
MCV: 101 fL — AB (ref 79.3–98.0)
MONO#: 1.6 10*3/uL — ABNORMAL HIGH (ref 0.1–0.9)
MONO%: 15.5 % — AB (ref 0.0–14.0)
NEUT%: 71.6 % (ref 39.0–75.0)
NEUTROS ABS: 7.1 10*3/uL — AB (ref 1.5–6.5)
Platelets: 288 10*3/uL (ref 140–400)
RBC: 3.11 10*6/uL — AB (ref 4.20–5.82)
RDW: 14.5 % (ref 11.0–14.6)
WBC: 10 10*3/uL (ref 4.0–10.3)

## 2014-12-28 LAB — COMPREHENSIVE METABOLIC PANEL (CC13)
ALBUMIN: 3.8 g/dL (ref 3.5–5.0)
ALT: 13 U/L (ref 0–55)
ANION GAP: 9 meq/L (ref 3–11)
AST: 16 U/L (ref 5–34)
Alkaline Phosphatase: 93 U/L (ref 40–150)
BUN: 16.3 mg/dL (ref 7.0–26.0)
CALCIUM: 9.4 mg/dL (ref 8.4–10.4)
CO2: 23 mEq/L (ref 22–29)
CREATININE: 0.8 mg/dL (ref 0.7–1.3)
Chloride: 101 mEq/L (ref 98–109)
EGFR: >90 mL/min/{1.73_m2} (ref >90)
Glucose: 87 mg/dl (ref 70–140)
Potassium: 5.3 mEq/L — ABNORMAL HIGH (ref 3.5–5.1)
SODIUM: 133 meq/L — AB (ref 136–145)
Total Bilirubin: 0.37 mg/dL (ref 0.20–1.20)
Total Protein: 6.3 g/dL — ABNORMAL LOW (ref 6.4–8.3)

## 2014-12-28 MED ORDER — FOSAPREPITANT DIMEGLUMINE INJECTION 150 MG
150.0000 mg | Freq: Once | INTRAVENOUS | Status: AC
Start: 2014-12-28 — End: 2014-12-28
  Administered 2014-12-28: 150 mg via INTRAVENOUS
  Filled 2014-12-28: qty 5

## 2014-12-28 MED ORDER — PALONOSETRON HCL INJECTION 0.25 MG/5ML
0.2500 mg | Freq: Once | INTRAVENOUS | Status: AC
  Administered 2014-12-28: 0.25 mg via INTRAVENOUS

## 2014-12-28 MED ORDER — DEXAMETHASONE SODIUM PHOSPHATE 20 MG/5ML IJ SOLN
INTRAMUSCULAR | Status: AC
  Filled 2014-12-28: qty 5

## 2014-12-28 MED ORDER — SODIUM CHLORIDE 0.9 % IV SOLN
Freq: Once | INTRAVENOUS | Status: AC
  Administered 2014-12-28: 09:00:00 via INTRAVENOUS

## 2014-12-28 MED ORDER — DEXTROSE-NACL 5-0.45 % IV SOLN
Freq: Once | INTRAVENOUS | Status: AC
  Administered 2014-12-28: 12:00:00 via INTRAVENOUS
  Filled 2014-12-28: qty 3

## 2014-12-28 MED ORDER — PALONOSETRON HCL INJECTION 0.25 MG/5ML
INTRAVENOUS | Status: AC
  Filled 2014-12-28: qty 5

## 2014-12-28 MED ORDER — DEXAMETHASONE SODIUM PHOSPHATE 20 MG/5ML IJ SOLN
12.0000 mg | Freq: Once | INTRAMUSCULAR | Status: AC
  Administered 2014-12-28: 20 mg via INTRAVENOUS

## 2014-12-28 MED ORDER — SODIUM CHLORIDE 0.9 % IV SOLN
60.0000 mg/m2 | Freq: Once | INTRAVENOUS | Status: AC
  Administered 2014-12-28: 107 mg via INTRAVENOUS
  Filled 2014-12-28: qty 107

## 2014-12-28 MED ORDER — SODIUM CHLORIDE 0.9 % IV SOLN
120.0000 mg/m2 | Freq: Once | INTRAVENOUS | Status: AC
  Administered 2014-12-28: 210 mg via INTRAVENOUS
  Filled 2014-12-28: qty 10.5

## 2014-12-28 NOTE — Telephone Encounter (Signed)
gv and printed appt sched and avs fo rp March.Marland KitchenMarland KitchenMarland KitchenMarland Kitchenpt will come back to get barium

## 2014-12-28 NOTE — Patient Instructions (Signed)
[facility] Discharge Instructions for Patients Receiving Chemotherapy  Today you received the following chemotherapy agents Etoposide and Cisplatin.  To help prevent nausea and vomiting after your treatment, we encourage you to take your nausea medication as prescribed.   If you develop nausea and vomiting that is not controlled by your nausea medication, call the clinic.   BELOW ARE SYMPTOMS THAT SHOULD BE REPORTED IMMEDIATELY:  *FEVER GREATER THAN 100.5 F  *CHILLS WITH OR WITHOUT FEVER  NAUSEA AND VOMITING THAT IS NOT CONTROLLED WITH YOUR NAUSEA MEDICATION  *UNUSUAL SHORTNESS OF BREATH  *UNUSUAL BRUISING OR BLEEDING  TENDERNESS IN MOUTH AND THROAT WITH OR WITHOUT PRESENCE OF ULCERS  *URINARY PROBLEMS  *BOWEL PROBLEMS  UNUSUAL RASH Items with * indicate a potential emergency and should be followed up as soon as possible.  Feel free to call the clinic you have any questions or concerns. The clinic phone number is [phone].

## 2014-12-28 NOTE — Progress Notes (Signed)
[facility] [phone]) [phone]   Fax:(336) [place], [place] [address]  DIAGNOSIS: Extensive Stage (T2, N1, M1b) Small cell lung Cancer diagnosed in October 2015.  PRIOR THERAPY: None  CURRENT THERAPY: Systemic chemotherapy with cisplatin 60 MG/M2 on day 1 and etoposide 120 MG/M2 on days 1, 2 and 3 with Neulasta support on day 4, status post 5 cycles.  INTERVAL HISTORY: Spencer Long 64 y.o. male returns to the clinic today for follow-up visit accompanied by his wife. The patient is feeling fine and tolerating his treatment with cisplatin and etoposide fairly well with no significant adverse effects. He denied having any significant chest pain, shortness of breath, cough or hemoptysis. The patient denied having any nausea or vomiting, no fever or chills. He is here today to start cycle #6 of his chemotherapy.  MEDICAL HISTORY: Past Medical History  Diagnosis Date  . Hypertension     under control, has been on med. x 15-20 yrs.  [name] GERD (gastroesophageal reflux disease)   . Infection of elbow     right-no infection at present , but has skin lesions present  . Sarcoidosis     "tends to have skin flareups" and presently experiencing now"feet, legs and arms"  . Chronic hyponatremia     Dr. Buddy Duty follows  . Lung cancer     ALLERGIES:  is allergic to varenicline; isothiazolinone chloride; oxycodone; and quaternium-15.  MEDICATIONS:  Current Outpatient Prescriptions  Medication Sig Dispense Refill  . amLODipine (NORVASC) 10 MG tablet Take 10 mg by mouth every morning.     [name] aspirin EC 81 MG tablet Take 81 mg by mouth every morning.     [name] atenolol (TENORMIN) 25 MG tablet Take 25 mg by mouth 2 (two) times daily.      . Azilsartan Medoxomil (EDARBI) 80 MG TABS Take 1 tablet by mouth every morning.    [name] buPROPion (WELLBUTRIN SR) 150 MG 12 hr tablet Take 1 tablet (150 mg total) by  mouth 2 (two) times daily. 60 tablet 2  . clobetasol cream (TEMOVATE) 9.44 % Apply 1 application topically 2 (two) times daily as needed.    . dicyclomine (BENTYL) 20 MG tablet Take 20 mg by mouth every 4 (four) hours as needed for spasms.     . folic acid (FOLVITE) 1 MG tablet Take 1 mg by mouth 2 (two) times daily.    [name] morphine (MSIR) 15 MG tablet Take 1 tablet (15 mg total) by mouth every 6 (six) hours as needed for severe pain. 30 tablet 0  . Multiple Vitamins-Minerals (MULTIVITAMIN ADULTS 50+ PO) Take 1 tablet by mouth every morning.     . nicotine (NICODERM CQ - DOSED IN MG/24 HOURS) 14 mg/24hr patch Place 1 patch (14 mg total) onto the skin daily. 28 patch 2  . omeprazole (PRILOSEC) 40 MG capsule Take 40 mg by mouth every morning.    . Probiotic Product (PROBIOTIC DAILY PO) Take 1 tablet by mouth every morning.     . prochlorperazine (COMPAZINE) 10 MG tablet Take 1 tablet (10 mg total) by mouth every 6 (six) hours as needed for nausea. 60 tablet 1  . sodium chloride 1 G tablet Take 1 g by mouth 3 (three) times daily.    . white petrolatum (VASELINE) GEL Apply 1 application topically daily as needed for lip care or dry skin.    [name] ondansetron (  ZOFRAN) 8 MG tablet Take 1 tablet (8 mg total) by mouth every 8 (eight) hours as needed for nausea or vomiting (start on 3rd day after chemo). (Patient not taking: Reported on 12/28/2014) 20 tablet 1   No current facility-administered medications for this visit.    SURGICAL HISTORY:  Past Surgical History  Procedure Laterality Date  . Cholecystectomy  05/2006  . Ulnar nerve repair  01/2008    right; decompression  . Carpal tunnel release  01/2008    right  . Eye surgery  12/2008    correction ectropion, release band, wedge exc., shortening  . I&d extremity  09/12/2011    Procedure: IRRIGATION AND DEBRIDEMENT EXTREMITY;  Surgeon: Tennis Must;  Location: [facility];  Service: Orthopedics;  Laterality: Right;  I&d RIGHT OLECRANON  BURSSA   . Tonsillectomy    . Endobronchial ultrasound Bilateral 08/24/2014    Procedure: ENDOBRONCHIAL ULTRASOUND;  Surgeon: Collene Gobble, MD;  Location: [facility];  Service: Cardiopulmonary;  Laterality: Bilateral;    REVIEW OF SYSTEMS:  A comprehensive review of systems was negative except for: Constitutional: positive for fatigue   PHYSICAL EXAMINATION: General appearance: alert, cooperative, fatigued and no distress Head: Normocephalic, without obvious abnormality, atraumatic Neck: no adenopathy, no JVD, supple, symmetrical, trachea midline and thyroid not enlarged, symmetric, no tenderness/mass/nodules Lymph nodes: Cervical, supraclavicular, and axillary nodes normal. Resp: clear to auscultation bilaterally Back: symmetric, no curvature. ROM normal. No CVA tenderness. Cardio: regular rate and rhythm, S1, S2 normal, no murmur, click, rub or gallop GI: soft, non-tender; bowel sounds normal; no masses,  no organomegaly Extremities: extremities normal, atraumatic, no cyanosis or edema  ECOG PERFORMANCE STATUS: 1 - Symptomatic but completely ambulatory  Blood pressure 133/85, pulse 77, temperature 98 F (36.7 C), temperature source Oral, resp. rate 18, height 5\' 9"  (1.753 m), weight 157 lb 14.4 oz (71.623 kg), SpO2 100 %.  LABORATORY DATA: Lab Results  Component Value Date   WBC 10.0 12/28/2014   HGB 10.9* 12/28/2014   HCT 31.4* 12/28/2014   MCV 101.0* 12/28/2014   PLT 288 12/28/2014      Chemistry      Component Value Date/Time   NA 133* 12/28/2014 0808   NA 125* 09/12/2011 1302   K 5.3* 12/28/2014 0808   K 5.5* 09/12/2011 1302   CL 95* 09/12/2011 1302   CO2 23 12/28/2014 0808   CO2 26 12/25/2008 1425   BUN 16.3 12/28/2014 0808   BUN 19 09/12/2011 1302   CREATININE 0.8 12/28/2014 0808   CREATININE 1.00 09/12/2011 1302      Component Value Date/Time   CALCIUM 9.2 12/21/2014 0936   CALCIUM 9.1 12/25/2008 1425   ALKPHOS 105 12/21/2014 0936   AST 15 12/21/2014  0936   ALT 11 12/21/2014 0936   BILITOT 0.31 12/21/2014 0936       RADIOGRAPHIC STUDIES: No results found.  ASSESSMENT AND PLAN: This is a very pleasant 64 years old white male with extensive stage small cell lung cancer currently undergoing systemic chemotherapy with cisplatin and etoposide status post 5 cycles and tolerating his treatment fairly well. We will proceed with cycle #6 today as scheduled. The patient would come back for follow-up visit in 3 weeks after repeating CT scan of the chest, abdomen and pelvis for restaging of his disease. He was advised to call immediately if he has any concerning symptoms in the interval. The patient voices understanding of current disease status and treatment options and is in agreement with  the current care plan.  All questions were answered. The patient knows to call the clinic with any problems, questions or concerns. We can certainly see the patient much sooner if necessary.  Disclaimer: This note was dictated with voice recognition software. Similar sounding words can inadvertently be transcribed and may not be corrected upon review.

## 2014-12-29 ENCOUNTER — Ambulatory Visit (HOSPITAL_BASED_OUTPATIENT_CLINIC_OR_DEPARTMENT_OTHER): Payer: 59

## 2014-12-29 DIAGNOSIS — C7A1 Malignant poorly differentiated neuroendocrine tumors: Secondary | ICD-10-CM

## 2014-12-29 DIAGNOSIS — C3492 Malignant neoplasm of unspecified part of left bronchus or lung: Secondary | ICD-10-CM

## 2014-12-29 DIAGNOSIS — Z5111 Encounter for antineoplastic chemotherapy: Secondary | ICD-10-CM

## 2014-12-29 MED ORDER — SODIUM CHLORIDE 0.9 % IV SOLN
Freq: Once | INTRAVENOUS | Status: AC
  Administered 2014-12-29: 08:00:00 via INTRAVENOUS

## 2014-12-29 MED ORDER — DEXAMETHASONE SODIUM PHOSPHATE 10 MG/ML IJ SOLN
10.0000 mg | Freq: Once | INTRAMUSCULAR | Status: AC
  Administered 2014-12-29: 10 mg via INTRAVENOUS

## 2014-12-29 MED ORDER — DEXAMETHASONE SODIUM PHOSPHATE 10 MG/ML IJ SOLN
INTRAMUSCULAR | Status: AC
  Filled 2014-12-29: qty 1

## 2014-12-29 MED ORDER — SODIUM CHLORIDE 0.9 % IV SOLN
120.0000 mg/m2 | Freq: Once | INTRAVENOUS | Status: AC
  Administered 2014-12-29: 210 mg via INTRAVENOUS
  Filled 2014-12-29: qty 10.5

## 2014-12-29 NOTE — Patient Instructions (Addendum)
[facility] Discharge Instructions for Patients Receiving Chemotherapy  Today you received the following chemotherapy agents Etoposide.  To help prevent nausea and vomiting after your treatment, we encourage you to take your nausea medication: Compazine. Take one every six hours as needed.   If you develop nausea and vomiting that is not controlled by your nausea medication, call the clinic.   BELOW ARE SYMPTOMS THAT SHOULD BE REPORTED IMMEDIATELY:  *FEVER GREATER THAN 100.5 F  *CHILLS WITH OR WITHOUT FEVER  NAUSEA AND VOMITING THAT IS NOT CONTROLLED WITH YOUR NAUSEA MEDICATION  *UNUSUAL SHORTNESS OF BREATH  *UNUSUAL BRUISING OR BLEEDING  TENDERNESS IN MOUTH AND THROAT WITH OR WITHOUT PRESENCE OF ULCERS  *URINARY PROBLEMS  *BOWEL PROBLEMS  UNUSUAL RASH Items with * indicate a potential emergency and should be followed up as soon as possible.  Feel free to call the clinic should you have any questions or concerns. The clinic phone number is [phone].

## 2014-12-30 ENCOUNTER — Ambulatory Visit (HOSPITAL_BASED_OUTPATIENT_CLINIC_OR_DEPARTMENT_OTHER): Payer: 59

## 2014-12-30 DIAGNOSIS — C7A1 Malignant poorly differentiated neuroendocrine tumors: Secondary | ICD-10-CM

## 2014-12-30 DIAGNOSIS — Z5111 Encounter for antineoplastic chemotherapy: Secondary | ICD-10-CM

## 2014-12-30 DIAGNOSIS — C3492 Malignant neoplasm of unspecified part of left bronchus or lung: Secondary | ICD-10-CM

## 2014-12-30 MED ORDER — SODIUM CHLORIDE 0.9 % IV SOLN
120.0000 mg/m2 | Freq: Once | INTRAVENOUS | Status: AC
  Administered 2014-12-30: 210 mg via INTRAVENOUS
  Filled 2014-12-30: qty 10.5

## 2014-12-30 MED ORDER — DEXAMETHASONE SODIUM PHOSPHATE 10 MG/ML IJ SOLN
10.0000 mg | Freq: Once | INTRAMUSCULAR | Status: AC
  Administered 2014-12-30: 10 mg via INTRAVENOUS

## 2014-12-30 MED ORDER — DEXAMETHASONE SODIUM PHOSPHATE 10 MG/ML IJ SOLN
INTRAMUSCULAR | Status: AC
Start: 2014-12-30 — End: 2014-12-30
  Filled 2014-12-30: qty 1

## 2014-12-30 MED ORDER — SODIUM CHLORIDE 0.9 % IV SOLN
Freq: Once | INTRAVENOUS | Status: AC
  Administered 2014-12-30: 09:00:00 via INTRAVENOUS

## 2014-12-30 NOTE — Patient Instructions (Signed)
[facility] Discharge Instructions for Patients Receiving Chemotherapy  Today you received the following chemotherapy agents:  Etoposide  To help prevent nausea and vomiting after your treatment, we encourage you to take your nausea medication as ordered per MD.   If you develop nausea and vomiting that is not controlled by your nausea medication, call the clinic.   BELOW ARE SYMPTOMS THAT SHOULD BE REPORTED IMMEDIATELY:  *FEVER GREATER THAN 100.5 F  *CHILLS WITH OR WITHOUT FEVER  NAUSEA AND VOMITING THAT IS NOT CONTROLLED WITH YOUR NAUSEA MEDICATION  *UNUSUAL SHORTNESS OF BREATH  *UNUSUAL BRUISING OR BLEEDING  TENDERNESS IN MOUTH AND THROAT WITH OR WITHOUT PRESENCE OF ULCERS  *URINARY PROBLEMS  *BOWEL PROBLEMS  UNUSUAL RASH Items with * indicate a potential emergency and should be followed up as soon as possible.  Feel free to call the clinic you have any questions or concerns. The clinic phone number is [phone].

## 2014-12-31 ENCOUNTER — Ambulatory Visit (HOSPITAL_BASED_OUTPATIENT_CLINIC_OR_DEPARTMENT_OTHER): Payer: 59

## 2014-12-31 DIAGNOSIS — Z5189 Encounter for other specified aftercare: Secondary | ICD-10-CM

## 2014-12-31 DIAGNOSIS — C7A1 Malignant poorly differentiated neuroendocrine tumors: Secondary | ICD-10-CM

## 2014-12-31 DIAGNOSIS — C3492 Malignant neoplasm of unspecified part of left bronchus or lung: Secondary | ICD-10-CM

## 2014-12-31 MED ORDER — PEGFILGRASTIM INJECTION 6 MG/0.6ML ~~LOC~~
6.0000 mg | PREFILLED_SYRINGE | Freq: Once | SUBCUTANEOUS | Status: AC
  Administered 2014-12-31: 6 mg via SUBCUTANEOUS
  Filled 2014-12-31: qty 0.6

## 2014-12-31 NOTE — Patient Instructions (Signed)
Pegfilgrastim injection What is this medicine? PEGFILGRASTIM (peg fil GRA stim) is a long-acting granulocyte colony-stimulating factor that stimulates the growth of neutrophils, a type of white blood cell important in the body's fight against infection. It is used to reduce the incidence of fever and infection in patients with certain types of cancer who are receiving chemotherapy that affects the bone marrow. This medicine may be used for other purposes; ask your health care provider or pharmacist if you have questions. COMMON BRAND NAME(S): Neulasta What should I tell my health care provider before I take this medicine? They need to know if you have any of these conditions: -latex allergy -ongoing radiation therapy -sickle cell disease -skin reactions to acrylic adhesives (On-Body Injector only) -an unusual or allergic reaction to pegfilgrastim, filgrastim, other medicines, foods, dyes, or preservatives -pregnant or trying to get pregnant -breast-feeding How should I use this medicine? This medicine is for injection under the skin. If you get this medicine at home, you will be taught how to prepare and give the pre-filled syringe or how to use the On-body Injector. Refer to the patient Instructions for Use for detailed instructions. Use exactly as directed. Take your medicine at regular intervals. Do not take your medicine more often than directed. It is important that you put your used needles and syringes in a special sharps container. Do not put them in a trash can. If you do not have a sharps container, call your pharmacist or healthcare provider to get one. Talk to your pediatrician regarding the use of this medicine in children. Special care may be needed. Overdosage: If you think you have taken too much of this medicine contact a poison control center or emergency room at once. NOTE: This medicine is only for you. Do not share this medicine with others. What if I miss a dose? It is  important not to miss your dose. Call your doctor or health care professional if you miss your dose. If you miss a dose due to an On-body Injector failure or leakage, a new dose should be administered as soon as possible using a single prefilled syringe for manual use. What may interact with this medicine? Interactions have not been studied. Give your health care provider a list of all the medicines, herbs, non-prescription drugs, or dietary supplements you use. Also tell them if you smoke, drink alcohol, or use illegal drugs. Some items may interact with your medicine. This list may not describe all possible interactions. Give your health care provider a list of all the medicines, herbs, non-prescription drugs, or dietary supplements you use. Also tell them if you smoke, drink alcohol, or use illegal drugs. Some items may interact with your medicine. What should I watch for while using this medicine? You may need blood work done while you are taking this medicine. If you are going to need a MRI, CT scan, or other procedure, tell your doctor that you are using this medicine (On-Body Injector only). What side effects may I notice from receiving this medicine? Side effects that you should report to your doctor or health care professional as soon as possible: -allergic reactions like skin rash, itching or hives, swelling of the face, lips, or tongue -dizziness -fever -pain, redness, or irritation at site where injected -pinpoint red spots on the skin -shortness of breath or breathing problems -stomach or side pain, or pain at the shoulder -swelling -tiredness -trouble passing urine Side effects that usually do not require medical attention (report to your doctor   or health care professional if they continue or are bothersome): -bone pain -muscle pain This list may not describe all possible side effects. Call your doctor for medical advice about side effects. You may report side effects to FDA at  1-800-FDA-1088. Where should I keep my medicine? Keep out of the reach of children. Store pre-filled syringes in a refrigerator between 2 and 8 degrees C (36 and 46 degrees F). Do not freeze. Keep in carton to protect from light. Throw away this medicine if it is left out of the refrigerator for more than 48 hours. Throw away any unused medicine after the expiration date. NOTE: This sheet is a summary. It may not cover all possible information. If you have questions about this medicine, talk to your doctor, pharmacist, or health care provider.  2015, [employer]vier/Gold Standard. (2014-01-15 16:14:05)  

## 2015-01-01 ENCOUNTER — Other Ambulatory Visit: Payer: Self-pay | Admitting: *Deleted

## 2015-01-01 DIAGNOSIS — C3490 Malignant neoplasm of unspecified part of unspecified bronchus or lung: Secondary | ICD-10-CM

## 2015-01-04 ENCOUNTER — Other Ambulatory Visit (HOSPITAL_BASED_OUTPATIENT_CLINIC_OR_DEPARTMENT_OTHER): Payer: 59

## 2015-01-04 DIAGNOSIS — C7A1 Malignant poorly differentiated neuroendocrine tumors: Secondary | ICD-10-CM

## 2015-01-04 DIAGNOSIS — C3490 Malignant neoplasm of unspecified part of unspecified bronchus or lung: Secondary | ICD-10-CM

## 2015-01-04 LAB — MAGNESIUM (CC13): MAGNESIUM: 2.3 mg/dL (ref 1.5–2.5)

## 2015-01-04 LAB — CBC WITH DIFFERENTIAL/PLATELET
BASO%: 0.4 % (ref 0.0–2.0)
Basophils Absolute: 0.1 10*3/uL (ref 0.0–0.1)
EOS ABS: 0 10*3/uL (ref 0.0–0.5)
EOS%: 0.1 % (ref 0.0–7.0)
HCT: 29.7 % — ABNORMAL LOW (ref 38.4–49.9)
HGB: 9.8 g/dL — ABNORMAL LOW (ref 13.0–17.1)
LYMPH%: 5.8 % — ABNORMAL LOW (ref 14.0–49.0)
MCH: 33.9 pg — ABNORMAL HIGH (ref 27.2–33.4)
MCHC: 33 g/dL (ref 32.0–36.0)
MCV: 102.6 fL — AB (ref 79.3–98.0)
MONO#: 0.6 10*3/uL (ref 0.1–0.9)
MONO%: 3.5 % (ref 0.0–14.0)
NEUT#: 15.9 10*3/uL — ABNORMAL HIGH (ref 1.5–6.5)
NEUT%: 90.2 % — AB (ref 39.0–75.0)
Platelets: 256 10*3/uL (ref 140–400)
RBC: 2.89 10*6/uL — AB (ref 4.20–5.82)
RDW: 14.2 % (ref 11.0–14.6)
WBC: 17.7 10*3/uL — AB (ref 4.0–10.3)
lymph#: 1 10*3/uL (ref 0.9–3.3)

## 2015-01-04 LAB — COMPREHENSIVE METABOLIC PANEL (CC13)
ALK PHOS: 153 U/L — AB (ref 40–150)
ALT: 11 U/L (ref 0–55)
AST: 14 U/L (ref 5–34)
Albumin: 3.8 g/dL (ref 3.5–5.0)
Anion Gap: 6 mEq/L (ref 3–11)
BILIRUBIN TOTAL: 0.37 mg/dL (ref 0.20–1.20)
BUN: 16.3 mg/dL (ref 7.0–26.0)
CO2: 26 mEq/L (ref 22–29)
CREATININE: 0.7 mg/dL (ref 0.7–1.3)
Calcium: 9.3 mg/dL (ref 8.4–10.4)
Chloride: 99 mEq/L (ref 98–109)
EGFR: >90 mL/min/{1.73_m2} (ref >90)
GLUCOSE: 96 mg/dL (ref 70–140)
Potassium: 5.5 mEq/L — ABNORMAL HIGH (ref 3.5–5.1)
Sodium: 131 mEq/L — ABNORMAL LOW (ref 136–145)
Total Protein: 6 g/dL — ABNORMAL LOW (ref 6.4–8.3)

## 2015-01-07 ENCOUNTER — Encounter: Payer: Self-pay | Admitting: Emergency Medicine

## 2015-01-07 NOTE — Telephone Encounter (Signed)
Spoke with patient via telephone, patient requested that these results be placed up front to be picked up Monday.  Nothing further needed.

## 2015-01-11 ENCOUNTER — Other Ambulatory Visit (HOSPITAL_BASED_OUTPATIENT_CLINIC_OR_DEPARTMENT_OTHER): Payer: 59

## 2015-01-11 DIAGNOSIS — C7A1 Malignant poorly differentiated neuroendocrine tumors: Secondary | ICD-10-CM

## 2015-01-11 DIAGNOSIS — C3492 Malignant neoplasm of unspecified part of left bronchus or lung: Secondary | ICD-10-CM

## 2015-01-11 LAB — COMPREHENSIVE METABOLIC PANEL (CC13)
ALT: 17 U/L (ref 0–55)
AST: 14 U/L (ref 5–34)
Albumin: 3.7 g/dL (ref 3.5–5.0)
Alkaline Phosphatase: 102 U/L (ref 40–150)
Anion Gap: 10 mEq/L (ref 3–11)
BUN: 13.1 mg/dL (ref 7.0–26.0)
CALCIUM: 8.7 mg/dL (ref 8.4–10.4)
CO2: 25 mEq/L (ref 22–29)
Chloride: 99 mEq/L (ref 98–109)
Creatinine: 0.8 mg/dL (ref 0.7–1.3)
EGFR: >90 mL/min/{1.73_m2} (ref >90)
Glucose: 97 mg/dl (ref 70–140)
POTASSIUM: 4.9 meq/L (ref 3.5–5.1)
SODIUM: 135 meq/L — AB (ref 136–145)
TOTAL PROTEIN: 6 g/dL — AB (ref 6.4–8.3)
Total Bilirubin: 0.29 mg/dL (ref 0.20–1.20)

## 2015-01-11 LAB — CBC WITH DIFFERENTIAL/PLATELET
BASO%: 0.6 % (ref 0.0–2.0)
Basophils Absolute: 0 10*3/uL (ref 0.0–0.1)
EOS ABS: 0 10*3/uL (ref 0.0–0.5)
EOS%: 0.3 % (ref 0.0–7.0)
HCT: 30.6 % — ABNORMAL LOW (ref 38.4–49.9)
HGB: 10.6 g/dL — ABNORMAL LOW (ref 13.0–17.1)
LYMPH%: 18.7 % (ref 14.0–49.0)
MCH: 35 pg — ABNORMAL HIGH (ref 27.2–33.4)
MCHC: 34.6 g/dL (ref 32.0–36.0)
MCV: 101 fL — ABNORMAL HIGH (ref 79.3–98.0)
MONO#: 0.9 10*3/uL (ref 0.1–0.9)
MONO%: 14.4 % — ABNORMAL HIGH (ref 0.0–14.0)
NEUT%: 66 % (ref 39.0–75.0)
NEUTROS ABS: 4.3 10*3/uL (ref 1.5–6.5)
Platelets: 147 10*3/uL (ref 140–400)
RBC: 3.03 10*6/uL — ABNORMAL LOW (ref 4.20–5.82)
RDW: 14.5 % (ref 11.0–14.6)
WBC: 6.5 10*3/uL (ref 4.0–10.3)
lymph#: 1.2 10*3/uL (ref 0.9–3.3)

## 2015-01-11 LAB — MAGNESIUM (CC13): Magnesium: 2.1 mg/dL (ref 1.5–2.5)

## 2015-01-18 ENCOUNTER — Encounter: Payer: Self-pay | Admitting: Internal Medicine

## 2015-01-18 ENCOUNTER — Telehealth: Payer: Self-pay | Admitting: Internal Medicine

## 2015-01-18 ENCOUNTER — Other Ambulatory Visit (HOSPITAL_BASED_OUTPATIENT_CLINIC_OR_DEPARTMENT_OTHER): Payer: 59

## 2015-01-18 ENCOUNTER — Encounter (HOSPITAL_COMMUNITY): Payer: Self-pay

## 2015-01-18 ENCOUNTER — Ambulatory Visit (HOSPITAL_COMMUNITY)
Admission: RE | Admit: 2015-01-18 | Discharge: 2015-01-18 | Disposition: A | Discharge status: Home / Self Care | Payer: 59 | Source: Ambulatory Visit | Attending: Internal Medicine | Admitting: Internal Medicine

## 2015-01-18 ENCOUNTER — Ambulatory Visit (HOSPITAL_BASED_OUTPATIENT_CLINIC_OR_DEPARTMENT_OTHER): Payer: 59 | Admitting: Internal Medicine

## 2015-01-18 VITALS — BP 154/82 | HR 86 | Temp 98.3°F | Resp 18 | Ht 69.0 in | Wt 161.1 lb

## 2015-01-18 DIAGNOSIS — N200 Calculus of kidney: Secondary | ICD-10-CM | POA: Insufficient documentation

## 2015-01-18 DIAGNOSIS — K5730 Diverticulosis of large intestine without perforation or abscess without bleeding: Secondary | ICD-10-CM | POA: Insufficient documentation

## 2015-01-18 DIAGNOSIS — C3490 Malignant neoplasm of unspecified part of unspecified bronchus or lung: Secondary | ICD-10-CM | POA: Insufficient documentation

## 2015-01-18 DIAGNOSIS — I10 Essential (primary) hypertension: Secondary | ICD-10-CM

## 2015-01-18 DIAGNOSIS — C3492 Malignant neoplasm of unspecified part of left bronchus or lung: Secondary | ICD-10-CM

## 2015-01-18 DIAGNOSIS — M5136 Other intervertebral disc degeneration, lumbar region: Secondary | ICD-10-CM | POA: Diagnosis not present

## 2015-01-18 DIAGNOSIS — I2510 Atherosclerotic heart disease of native coronary artery without angina pectoris: Secondary | ICD-10-CM | POA: Insufficient documentation

## 2015-01-18 DIAGNOSIS — J8410 Pulmonary fibrosis, unspecified: Secondary | ICD-10-CM | POA: Insufficient documentation

## 2015-01-18 DIAGNOSIS — C7A1 Malignant poorly differentiated neuroendocrine tumors: Secondary | ICD-10-CM

## 2015-01-18 LAB — COMPREHENSIVE METABOLIC PANEL (CC13)
ALBUMIN: 4 g/dL (ref 3.5–5.0)
ALT: 16 U/L (ref 0–55)
ANION GAP: 11 meq/L (ref 3–11)
AST: 17 U/L (ref 5–34)
Alkaline Phosphatase: 93 U/L (ref 40–150)
BILIRUBIN TOTAL: 0.4 mg/dL (ref 0.20–1.20)
BUN: 12.5 mg/dL (ref 7.0–26.0)
CHLORIDE: 99 meq/L (ref 98–109)
CO2: 25 mEq/L (ref 22–29)
Calcium: 9.3 mg/dL (ref 8.4–10.4)
Creatinine: 0.8 mg/dL (ref 0.7–1.3)
EGFR: >90 mL/min/{1.73_m2} (ref >90)
GLUCOSE: 105 mg/dL (ref 70–140)
Potassium: 5.5 mEq/L — ABNORMAL HIGH (ref 3.5–5.1)
SODIUM: 135 meq/L — AB (ref 136–145)
Total Protein: 6.4 g/dL (ref 6.4–8.3)

## 2015-01-18 LAB — CBC WITH DIFFERENTIAL/PLATELET
BASO%: 0.2 % (ref 0.0–2.0)
BASOS ABS: 0 10*3/uL (ref 0.0–0.1)
EOS ABS: 0.1 10*3/uL (ref 0.0–0.5)
EOS%: 0.6 % (ref 0.0–7.0)
HEMATOCRIT: 34.7 % — AB (ref 38.4–49.9)
HGB: 12.1 g/dL — ABNORMAL LOW (ref 13.0–17.1)
LYMPH%: 13.6 % — AB (ref 14.0–49.0)
MCH: 35.1 pg — AB (ref 27.2–33.4)
MCHC: 34.9 g/dL (ref 32.0–36.0)
MCV: 100.6 fL — AB (ref 79.3–98.0)
MONO#: 1.1 10*3/uL — ABNORMAL HIGH (ref 0.1–0.9)
MONO%: 12.8 % (ref 0.0–14.0)
NEUT#: 6.1 10*3/uL (ref 1.5–6.5)
NEUT%: 72.8 % (ref 39.0–75.0)
Platelets: 250 10*3/uL (ref 140–400)
RBC: 3.45 10*6/uL — ABNORMAL LOW (ref 4.20–5.82)
RDW: 14.5 % (ref 11.0–14.6)
WBC: 8.3 10*3/uL (ref 4.0–10.3)
lymph#: 1.1 10*3/uL (ref 0.9–3.3)

## 2015-01-18 LAB — MAGNESIUM (CC13): MAGNESIUM: 2.1 mg/dL (ref 1.5–2.5)

## 2015-01-18 MED ORDER — IOHEXOL 300 MG/ML  SOLN
100.0000 mL | Freq: Once | INTRAMUSCULAR | Status: AC | PRN
  Administered 2015-01-18: 100 mL via INTRAVENOUS

## 2015-01-18 NOTE — Progress Notes (Signed)
[facility] [phone]   Fax:(336) [place], [place] [address]  DIAGNOSIS: Extensive Stage (T2, N1, M1b) Small cell lung Cancer diagnosed in October 2015.  PRIOR THERAPY: Systemic chemotherapy with cisplatin 60 MG/M2 on day 1 and etoposide 120 MG/M2 on days 1, 2 and 3 with Neulasta support on day 4, status post 6 cycles.  CURRENT THERAPY: Observation.  INTERVAL HISTORY: Spencer Long 64 y.o. male returns to the clinic today for follow-up visit accompanied by his wife. The patient is feeling fine and tolerated his treatment with cisplatin and etoposide fairly well with no significant adverse effects. Status post 6 cycles of treatment. He denied having any significant chest pain, shortness of breath, cough or hemoptysis. The patient denied having any nausea or vomiting, no fever or chills. He had repeat CT scan of the chest, abdomen and pelvis performed recently and he is here for evaluation and discussion of his scan results.  MEDICAL HISTORY: Past Medical History  Diagnosis Date  . Hypertension     under control, has been on med. x 15-20 yrs.  [name] GERD (gastroesophageal reflux disease)   . Infection of elbow     right-no infection at present , but has skin lesions present  . Sarcoidosis     "tends to have skin flareups" and presently experiencing now"feet, legs and arms"  . Chronic hyponatremia     Dr. Buddy Duty follows  . Lung cancer     ALLERGIES:  is allergic to varenicline; isothiazolinone chloride; oxycodone; and quaternium-15.  MEDICATIONS:  Current Outpatient Prescriptions  Medication Sig Dispense Refill  . amLODipine (NORVASC) 10 MG tablet Take 10 mg by mouth every morning.     [name] aspirin EC 81 MG tablet Take 81 mg by mouth every morning.     [name] atenolol (TENORMIN) 25 MG tablet Take 25 mg by mouth 2 (two) times daily.      . Azilsartan Medoxomil (EDARBI) 80 MG  TABS Take 1 tablet by mouth every morning.    [name] buPROPion (WELLBUTRIN SR) 150 MG 12 hr tablet Take 1 tablet (150 mg total) by mouth 2 (two) times daily. 60 tablet 2  . clobetasol cream (TEMOVATE) 2.70 % Apply 1 application topically 2 (two) times daily as needed.    . folic acid (FOLVITE) 1 MG tablet Take 1 mg by mouth 2 (two) times daily.    [name] morphine (MSIR) 15 MG tablet Take 1 tablet (15 mg total) by mouth every 6 (six) hours as needed for severe pain. 30 tablet 0  . Multiple Vitamins-Minerals (MULTIVITAMIN ADULTS 50+ PO) Take 1 tablet by mouth every morning.     . nicotine (NICODERM CQ - DOSED IN MG/24 HOURS) 14 mg/24hr patch Place 1 patch (14 mg total) onto the skin daily. 28 patch 2  . omeprazole (PRILOSEC) 40 MG capsule Take 40 mg by mouth every morning.    . Probiotic Product (PROBIOTIC DAILY PO) Take 1 tablet by mouth every morning.     . sodium chloride 1 G tablet Take 1 g by mouth 3 (three) times daily.    . white petrolatum (VASELINE) GEL Apply 1 application topically daily as needed for lip care or dry skin.    [name] dicyclomine (BENTYL) 20 MG tablet Take 20 mg by mouth every 4 (four) hours as needed for spasms.     . ondansetron (ZOFRAN) 8 MG tablet Take 1  tablet (8 mg total) by mouth every 8 (eight) hours as needed for nausea or vomiting (start on 3rd day after chemo). (Patient not taking: Reported on 12/28/2014) 20 tablet 1  . prochlorperazine (COMPAZINE) 10 MG tablet Take 1 tablet (10 mg total) by mouth every 6 (six) hours as needed for nausea. (Patient not taking: Reported on 01/18/2015) 60 tablet 1   No current facility-administered medications for this visit.    SURGICAL HISTORY:  Past Surgical History  Procedure Laterality Date  . Cholecystectomy  05/2006  . Ulnar nerve repair  01/2008    right; decompression  . Carpal tunnel release  01/2008    right  . Eye surgery  12/2008    correction ectropion, release band, wedge exc., shortening  . I&d extremity  09/12/2011     Procedure: IRRIGATION AND DEBRIDEMENT EXTREMITY;  Surgeon: Tennis Must;  Location: [facility];  Service: Orthopedics;  Laterality: Right;  I&d RIGHT OLECRANON BURSSA   . Tonsillectomy    . Endobronchial ultrasound Bilateral 08/24/2014    Procedure: ENDOBRONCHIAL ULTRASOUND;  Surgeon: Collene Gobble, MD;  Location: [facility];  Service: Cardiopulmonary;  Laterality: Bilateral;    REVIEW OF SYSTEMS:  Constitutional: positive for fatigue Eyes: negative Ears, nose, mouth, throat, and face: negative Respiratory: negative Cardiovascular: negative Gastrointestinal: negative Genitourinary:negative Integument/breast: negative Hematologic/lymphatic: negative Musculoskeletal:negative Neurological: negative Behavioral/Psych: negative Endocrine: negative Allergic/Immunologic: negative   PHYSICAL EXAMINATION: General appearance: alert, cooperative, fatigued and no distress Head: Normocephalic, without obvious abnormality, atraumatic Neck: no adenopathy, no JVD, supple, symmetrical, trachea midline and thyroid not enlarged, symmetric, no tenderness/mass/nodules Lymph nodes: Cervical, supraclavicular, and axillary nodes normal. Resp: clear to auscultation bilaterally Back: symmetric, no curvature. ROM normal. No CVA tenderness. Cardio: regular rate and rhythm, S1, S2 normal, no murmur, click, rub or gallop GI: soft, non-tender; bowel sounds normal; no masses,  no organomegaly Extremities: extremities normal, atraumatic, no cyanosis or edema Neurologic: Alert and oriented X 3, normal strength and tone. Normal symmetric reflexes. Normal coordination and gait  ECOG PERFORMANCE STATUS: 1 - Symptomatic but completely ambulatory  Blood pressure 154/82, pulse 86, temperature 98.3 F (36.8 C), temperature source Oral, resp. rate 18, height 5\' 9"  (1.753 m), weight 161 lb 1.6 oz (73.074 kg), SpO2 100 %.  LABORATORY DATA: Lab Results  Component Value Date   WBC 8.3 01/18/2015   HGB  12.1* 01/18/2015   HCT 34.7* 01/18/2015   MCV 100.6* 01/18/2015   PLT 250 01/18/2015      Chemistry      Component Value Date/Time   NA 135* 01/11/2015 0946   NA 125* 09/12/2011 1302   K 4.9 01/11/2015 0946   K 5.5* 09/12/2011 1302   CL 95* 09/12/2011 1302   CO2 25 01/11/2015 0946   CO2 26 12/25/2008 1425   BUN 13.1 01/11/2015 0946   BUN 19 09/12/2011 1302   CREATININE 0.8 01/11/2015 0946   CREATININE 1.00 09/12/2011 1302      Component Value Date/Time   CALCIUM 8.7 01/11/2015 0946   CALCIUM 9.1 12/25/2008 1425   ALKPHOS 102 01/11/2015 0946   AST 14 01/11/2015 0946   ALT 17 01/11/2015 0946   BILITOT 0.29 01/11/2015 0946       RADIOGRAPHIC STUDIES: Ct Chest W Contrast  01/18/2015   CLINICAL DATA:  Small cell lung cancer  EXAM: CT CHEST, ABDOMEN, AND PELVIS WITH CONTRAST  TECHNIQUE: Multidetector CT imaging of the chest, abdomen and pelvis was performed following the standard protocol during bolus administration of  intravenous contrast.  CONTRAST:  124mL OMNIPAQUE IOHEXOL 300 MG/ML  SOLN  COMPARISON:  None.  FINDINGS: CT CHEST FINDINGS  Mediastinum: The heart size appears normal. No pericardial or pleural effusion identified. There is calcified atherosclerotic disease involving the thoracic aorta. There also calcifications involving the LAD, left circumflex and RCA Coronary arteries. No mediastinal adenopathy. Index left hilar lymph node measures 1 cm, image 41/series 2. This is unchanged compared with previous exam.  Lungs/Pleura: No pleural effusion identified. The central left infrahilar lesion measures 1 cm, image 40/series 2. On the previous exam this measured the same. There is persistent endobronchial filling defect within the posterior left lower lobe airway which may represent chronic mucous plugging secondary to obstructing endobronchial lesion. The appearance is unchanged from the previous exam. No new pulmonary nodule or mass identified. Calcified granuloma is identified  in the lingular portion of the left lung  Musculoskeletal: Review of the visualized bony structures is negative for aggressive lytic or sclerotic bone lesion.  CT ABDOMEN AND PELVIS FINDINGS  Hepatobiliary: No focal liver abnormality. The gallbladder is surgically absent. There is no biliary dilatation.  Pancreas: Normal appearance of the pancreas.  Spleen: The spleen is unremarkable.  Adrenals/Urinary Tract: The adrenal glands are both normal. Normal appearance of the left kidney. Small right renal calculi are again noted. The largest is in the inferior pole measuring 5 mm, image 75/series 2. The urinary bladder appears normal.  Stomach/Bowel: The stomach is unremarkable. The small bowel loops have a normal course and caliber without evidence for bowel obstruction. Normal appearance of the proximal colon. There are multiple distal colonic diverticula without acute inflammation.  Vascular/Lymphatic: Calcified atherosclerotic disease involves the abdominal aorta. No aneurysm. No enlarged retroperitoneal or mesenteric adenopathy. No enlarged pelvic or inguinal lymph nodes.  Reproductive: The prostate gland and seminal vesicles are unremarkable.  Other: There is no free fluid or fluid collections within the abdomen or pelvis.  Musculoskeletal: Degenerative disc disease is identified within the lower lumbar spine. No aggressive lytic or sclerotic bone lesions identified.  IMPRESSION: 1. Stable left lower lobe perihilar lesion with associated endobronchial opacification within the posterior left lower lobe. 2. No evidence for disease progression or metastatic disease. 3. Atherosclerotic disease including multi vessel coronary artery calcification.   Electronically Signed   By: Kerby Moors M.D.   On: 01/18/2015 09:17   Ct Abdomen Pelvis W Contrast  01/18/2015   CLINICAL DATA:  Small cell lung cancer  EXAM: CT CHEST, ABDOMEN, AND PELVIS WITH CONTRAST  TECHNIQUE: Multidetector CT imaging of the chest, abdomen and  pelvis was performed following the standard protocol during bolus administration of intravenous contrast.  CONTRAST:  161mL OMNIPAQUE IOHEXOL 300 MG/ML  SOLN  COMPARISON:  None.  FINDINGS: CT CHEST FINDINGS  Mediastinum: The heart size appears normal. No pericardial or pleural effusion identified. There is calcified atherosclerotic disease involving the thoracic aorta. There also calcifications involving the LAD, left circumflex and RCA Coronary arteries. No mediastinal adenopathy. Index left hilar lymph node measures 1 cm, image 41/series 2. This is unchanged compared with previous exam.  Lungs/Pleura: No pleural effusion identified. The central left infrahilar lesion measures 1 cm, image 40/series 2. On the previous exam this measured the same. There is persistent endobronchial filling defect within the posterior left lower lobe airway which may represent chronic mucous plugging secondary to obstructing endobronchial lesion. The appearance is unchanged from the previous exam. No new pulmonary nodule or mass identified. Calcified granuloma is identified in the lingular portion  of the left lung  Musculoskeletal: Review of the visualized bony structures is negative for aggressive lytic or sclerotic bone lesion.  CT ABDOMEN AND PELVIS FINDINGS  Hepatobiliary: No focal liver abnormality. The gallbladder is surgically absent. There is no biliary dilatation.  Pancreas: Normal appearance of the pancreas.  Spleen: The spleen is unremarkable.  Adrenals/Urinary Tract: The adrenal glands are both normal. Normal appearance of the left kidney. Small right renal calculi are again noted. The largest is in the inferior pole measuring 5 mm, image 75/series 2. The urinary bladder appears normal.  Stomach/Bowel: The stomach is unremarkable. The small bowel loops have a normal course and caliber without evidence for bowel obstruction. Normal appearance of the proximal colon. There are multiple distal colonic diverticula without acute  inflammation.  Vascular/Lymphatic: Calcified atherosclerotic disease involves the abdominal aorta. No aneurysm. No enlarged retroperitoneal or mesenteric adenopathy. No enlarged pelvic or inguinal lymph nodes.  Reproductive: The prostate gland and seminal vesicles are unremarkable.  Other: There is no free fluid or fluid collections within the abdomen or pelvis.  Musculoskeletal: Degenerative disc disease is identified within the lower lumbar spine. No aggressive lytic or sclerotic bone lesions identified.  IMPRESSION: 1. Stable left lower lobe perihilar lesion with associated endobronchial opacification within the posterior left lower lobe. 2. No evidence for disease progression or metastatic disease. 3. Atherosclerotic disease including multi vessel coronary artery calcification.   Electronically Signed   By: Kerby Moors M.D.   On: 01/18/2015 09:17    ASSESSMENT AND PLAN: This is a very pleasant 64 years old white male with extensive stage small cell lung cancer currently undergoing systemic chemotherapy with cisplatin and etoposide status post 6 cycles and tolerated his treatment fairly well. The recent CT scan of the chest, abdomen and pelvis showed stable disease with no evidence for disease progression. I discussed the scan results with the patient and his wife. I recommended for him to see Dr. Sondra Come for evaluation and consideration of prophylactic cranial irradiation. I will see the patient back for follow-up visit in 3 months for reevaluation after repeating CT scan of the chest, abdomen pelvis for restaging of his disease. He was advised to call immediately if he has any concerning symptoms in the interval. The patient voices understanding of current disease status and treatment options and is in agreement with the current care plan.  All questions were answered. The patient knows to call the clinic with any problems, questions or concerns. We can certainly see the patient much sooner if  necessary.  Disclaimer: This note was dictated with voice recognition software. Similar sounding words can inadvertently be transcribed and may not be corrected upon review.

## 2015-01-18 NOTE — Telephone Encounter (Signed)
gv and printed appt sched and avs for pt for Sept....gv pt barium

## 2015-01-19 NOTE — Progress Notes (Signed)
Histology and Location of Primary Cancer: Extensive Stage (T2, N1, M1b) Small cell lung Cancer diagnosed in October 2015 to be seen for prophylactic cranial irradiation  Past/Anticipated chemotherapy by medical oncology, if any: Systemic chemotherapy with cisplatin 60 MG/M2 on day 1 and etoposide 120 MG/M2 on days 1, 2 and 3 with Neulasta support on day 4, status post 6 cycles.  Pain on a scale of 0-10 is: 0  SAFETY ISSUES:  Prior radiation? no  Pacemaker/ICD? no  Possible current pregnancy? no  Is the patient on methotrexate? no  Additional Complaints / other details:  Patient is here with his wife.  He has a rash (contact dermatitis) on both arms, left food, neck and around his eyes.  He is seeing a dermatologist, Dr. Delman Cheadle.  BP 150/78 mmHg  Pulse 96  Temp(Src) 97.9 F (36.6 C) (Oral)  Resp 16  Ht 5\' 9"  (1.753 m)  Wt 161 lb 12.8 oz (73.392 kg)  BMI 23.88 kg/m2  SpO2 100%

## 2015-01-25 ENCOUNTER — Ambulatory Visit: Payer: 59

## 2015-01-28 ENCOUNTER — Ambulatory Visit
Admission: RE | Admit: 2015-01-28 | Discharge: 2015-01-28 | Disposition: A | Discharge status: Home / Self Care | Payer: 59 | Source: Ambulatory Visit | Attending: Radiation Oncology | Admitting: Radiation Oncology

## 2015-01-28 ENCOUNTER — Encounter: Payer: Self-pay | Admitting: Radiation Oncology

## 2015-01-28 VITALS — BP 150/78 | HR 96 | Temp 97.9°F | Resp 16 | Ht 69.0 in | Wt 161.8 lb

## 2015-01-28 DIAGNOSIS — C3492 Malignant neoplasm of unspecified part of left bronchus or lung: Secondary | ICD-10-CM

## 2015-01-28 DIAGNOSIS — Z87891 Personal history of nicotine dependence: Secondary | ICD-10-CM | POA: Diagnosis not present

## 2015-01-28 DIAGNOSIS — C3490 Malignant neoplasm of unspecified part of unspecified bronchus or lung: Secondary | ICD-10-CM

## 2015-01-28 NOTE — Progress Notes (Signed)
Please see the Nurse Progress Note in the MD Initial Consult Encounter for this patient. 

## 2015-01-28 NOTE — Progress Notes (Signed)
Radiation Oncology         [phone] ________________________________  Name: Spencer Long MRN: [id number]  Date: 01/28/2015  DOB: 10-23-1951  Follow-Up Visit Note  CC: [name], MD  Curt Bears, MD    ICD-9-CM ICD-10-CM   1. Small cell lung cancer, unspecified laterality 162.9 C34.90     Diagnosis:   Extensive Stage (T2, N1, M1b) Small cell lung Cancer diagnosed in October 2015.   Narrative:  The patient returns today for further evaluation at the courtesy of Dr. [name]. The patient was initially seen in November 2015 at the time of diagnosis. Patient was found to have extensive stage disease at that time with no immediate plans for radiation therapy. The patient has completed systemic chemotherapy with cisplatin 60 MG/M2 on day 1 and etoposide 120 MG/M2 on days 1, 2 and 3 with Neulasta support on day 4, status post 6 cycles.  He has received a very favorable response to his chemotherapy with no significant progressive disease. Clinically the patient is doing well at this time. He is now referred to radiation oncology for consideration for prophylactic cranial radiation.  Patient denies any headaches or nausea. He denies any focal motor weakness.               ALLERGIES:  is allergic to varenicline; isothiazolinone chloride; oxycodone; and quaternium-15.  Meds: Current Outpatient Prescriptions  Medication Sig Dispense Refill  . amLODipine (NORVASC) 10 MG tablet Take 10 mg by mouth every morning.     [name] aspirin EC 81 MG tablet Take 81 mg by mouth every morning.     [name] atenolol (TENORMIN) 25 MG tablet Take 25 mg by mouth 2 (two) times daily.      . Azilsartan Medoxomil (EDARBI) 80 MG TABS Take 1 tablet by mouth every morning.    [name] buPROPion (WELLBUTRIN SR) 150 MG 12 hr tablet Take 1 tablet (150 mg total) by mouth 2 (two) times daily. 60 tablet 2  . calcipotriene-betamethasone (TACLONEX) ointment Apply 1 application topically daily.    . folic acid (FOLVITE) 1  MG tablet Take 1 mg by mouth 2 (two) times daily.    . Multiple Vitamins-Minerals (MULTIVITAMIN ADULTS 50+ PO) Take 1 tablet by mouth every morning.     . mupirocin cream (BACTROBAN) 2 % Apply 1 application topically 2 (two) times daily.    . nicotine (NICODERM CQ - DOSED IN MG/24 HOURS) 14 mg/24hr patch Place 1 patch (14 mg total) onto the skin daily. 28 patch 2  . omeprazole (PRILOSEC) 40 MG capsule Take 40 mg by mouth every morning.    . predniSONE (DELTASONE) 10 MG tablet Take 40 mg by mouth daily with breakfast. Taking 40 mg for 3 days, then taper down    . Probiotic Product (PROBIOTIC DAILY PO) Take 1 tablet by mouth every morning.     . prochlorperazine (COMPAZINE) 10 MG tablet Take 1 tablet (10 mg total) by mouth every 6 (six) hours as needed for nausea. 60 tablet 1  . sodium chloride 1 G tablet Take 1 g by mouth 3 (three) times daily.    . clobetasol cream (TEMOVATE) 4.91 % Apply 1 application topically 2 (two) times daily as needed.    . dicyclomine (BENTYL) 20 MG tablet Take 20 mg by mouth every 4 (four) hours as needed for spasms.     [name] morphine (MSIR) 15 MG tablet Take 1 tablet (15 mg total) by mouth every 6 (six) hours as needed for severe pain. (  Patient not taking: Reported on 01/28/2015) 30 tablet 0  . ondansetron (ZOFRAN) 8 MG tablet Take 1 tablet (8 mg total) by mouth every 8 (eight) hours as needed for nausea or vomiting (start on 3rd day after chemo). (Patient not taking: Reported on 12/28/2014) 20 tablet 1  . white petrolatum (VASELINE) GEL Apply 1 application topically daily as needed for lip care or dry skin.     No current facility-administered medications for this encounter.    Physical Findings: The patient is in no acute distress. Patient is alert and oriented.  height is 5\' 9"  (1.753 m) and weight is 161 lb 12.8 oz (73.392 kg). His oral temperature is 97.9 F (36.6 C). His blood pressure is 150/78 and his pulse is 96. His respiration is 16 and oxygen saturation is 100%.  .  No palpable subclavicular or axillary adenopathy. The lungs are clear to auscultation. The heart has regular rhythm and rate. On neurological examination motor strength is 5 out of 5 in the proximal and distal muscle groups of the upper lower extremities.  Lab Findings: Lab Results  Component Value Date   WBC 8.3 01/18/2015   HGB 12.1* 01/18/2015   HCT 34.7* 01/18/2015   MCV 100.6* 01/18/2015   PLT 250 01/18/2015    Radiographic Findings: Ct Chest W Contrast  01/18/2015   CLINICAL DATA:  Small cell lung cancer  EXAM: CT CHEST, ABDOMEN, AND PELVIS WITH CONTRAST  TECHNIQUE: Multidetector CT imaging of the chest, abdomen and pelvis was performed following the standard protocol during bolus administration of intravenous contrast.  CONTRAST:  177mL OMNIPAQUE IOHEXOL 300 MG/ML  SOLN  COMPARISON:  None.  FINDINGS: CT CHEST FINDINGS  Mediastinum: The heart size appears normal. No pericardial or pleural effusion identified. There is calcified atherosclerotic disease involving the thoracic aorta. There also calcifications involving the LAD, left circumflex and RCA Coronary arteries. No mediastinal adenopathy. Index left hilar lymph node measures 1 cm, image 41/series 2. This is unchanged compared with previous exam.  Lungs/Pleura: No pleural effusion identified. The central left infrahilar lesion measures 1 cm, image 40/series 2. On the previous exam this measured the same. There is persistent endobronchial filling defect within the posterior left lower lobe airway which may represent chronic mucous plugging secondary to obstructing endobronchial lesion. The appearance is unchanged from the previous exam. No new pulmonary nodule or mass identified. Calcified granuloma is identified in the lingular portion of the left lung  Musculoskeletal: Review of the visualized bony structures is negative for aggressive lytic or sclerotic bone lesion.  CT ABDOMEN AND PELVIS FINDINGS  Hepatobiliary: No focal liver  abnormality. The gallbladder is surgically absent. There is no biliary dilatation.  Pancreas: Normal appearance of the pancreas.  Spleen: The spleen is unremarkable.  Adrenals/Urinary Tract: The adrenal glands are both normal. Normal appearance of the left kidney. Small right renal calculi are again noted. The largest is in the inferior pole measuring 5 mm, image 75/series 2. The urinary bladder appears normal.  Stomach/Bowel: The stomach is unremarkable. The small bowel loops have a normal course and caliber without evidence for bowel obstruction. Normal appearance of the proximal colon. There are multiple distal colonic diverticula without acute inflammation.  Vascular/Lymphatic: Calcified atherosclerotic disease involves the abdominal aorta. No aneurysm. No enlarged retroperitoneal or mesenteric adenopathy. No enlarged pelvic or inguinal lymph nodes.  Reproductive: The prostate gland and seminal vesicles are unremarkable.  Other: There is no free fluid or fluid collections within the abdomen or pelvis.  Musculoskeletal: Degenerative  disc disease is identified within the lower lumbar spine. No aggressive lytic or sclerotic bone lesions identified.  IMPRESSION: 1. Stable left lower lobe perihilar lesion with associated endobronchial opacification within the posterior left lower lobe. 2. No evidence for disease progression or metastatic disease. 3. Atherosclerotic disease including multi vessel coronary artery calcification.   Electronically Signed   By: Kerby Moors M.D.   On: 01/18/2015 09:17   Ct Abdomen Pelvis W Contrast  01/18/2015   CLINICAL DATA:  Small cell lung cancer  EXAM: CT CHEST, ABDOMEN, AND PELVIS WITH CONTRAST  TECHNIQUE: Multidetector CT imaging of the chest, abdomen and pelvis was performed following the standard protocol during bolus administration of intravenous contrast.  CONTRAST:  119mL OMNIPAQUE IOHEXOL 300 MG/ML  SOLN  COMPARISON:  None.  FINDINGS: CT CHEST FINDINGS  Mediastinum: The  heart size appears normal. No pericardial or pleural effusion identified. There is calcified atherosclerotic disease involving the thoracic aorta. There also calcifications involving the LAD, left circumflex and RCA Coronary arteries. No mediastinal adenopathy. Index left hilar lymph node measures 1 cm, image 41/series 2. This is unchanged compared with previous exam.  Lungs/Pleura: No pleural effusion identified. The central left infrahilar lesion measures 1 cm, image 40/series 2. On the previous exam this measured the same. There is persistent endobronchial filling defect within the posterior left lower lobe airway which may represent chronic mucous plugging secondary to obstructing endobronchial lesion. The appearance is unchanged from the previous exam. No new pulmonary nodule or mass identified. Calcified granuloma is identified in the lingular portion of the left lung  Musculoskeletal: Review of the visualized bony structures is negative for aggressive lytic or sclerotic bone lesion.  CT ABDOMEN AND PELVIS FINDINGS  Hepatobiliary: No focal liver abnormality. The gallbladder is surgically absent. There is no biliary dilatation.  Pancreas: Normal appearance of the pancreas.  Spleen: The spleen is unremarkable.  Adrenals/Urinary Tract: The adrenal glands are both normal. Normal appearance of the left kidney. Small right renal calculi are again noted. The largest is in the inferior pole measuring 5 mm, image 75/series 2. The urinary bladder appears normal.  Stomach/Bowel: The stomach is unremarkable. The small bowel loops have a normal course and caliber without evidence for bowel obstruction. Normal appearance of the proximal colon. There are multiple distal colonic diverticula without acute inflammation.  Vascular/Lymphatic: Calcified atherosclerotic disease involves the abdominal aorta. No aneurysm. No enlarged retroperitoneal or mesenteric adenopathy. No enlarged pelvic or inguinal lymph nodes.  Reproductive:  The prostate gland and seminal vesicles are unremarkable.  Other: There is no free fluid or fluid collections within the abdomen or pelvis.  Musculoskeletal: Degenerative disc disease is identified within the lower lumbar spine. No aggressive lytic or sclerotic bone lesions identified.  IMPRESSION: 1. Stable left lower lobe perihilar lesion with associated endobronchial opacification within the posterior left lower lobe. 2. No evidence for disease progression or metastatic disease. 3. Atherosclerotic disease including multi vessel coronary artery calcification.   Electronically Signed   By: Kerby Moors M.D.   On: 01/18/2015 09:17    Impression:  Extensive stage small cell lung cancer in remission. Given the patient's good performance status and imaging results as above I do feel he would be a good candidate for prophylactic cranial irradiation. Prior to proceeding with this treatment the patient will be set up for a repeat MRI to rule out brain metastasis.  Plan:  Simulation and planning after completion of MRI.  ____________________________________ Blair Promise, MD

## 2015-01-29 ENCOUNTER — Telehealth: Payer: Self-pay

## 2015-01-29 NOTE — Telephone Encounter (Signed)
01/29/15 Disc Received from [facility] and filed on shelf.[name]

## 2015-02-03 ENCOUNTER — Ambulatory Visit
Admission: RE | Admit: 2015-02-03 | Discharge: 2015-02-03 | Disposition: A | Discharge status: Home / Self Care | Payer: 59 | Source: Ambulatory Visit | Attending: Radiation Oncology | Admitting: Radiation Oncology

## 2015-02-03 DIAGNOSIS — C3490 Malignant neoplasm of unspecified part of unspecified bronchus or lung: Secondary | ICD-10-CM

## 2015-02-03 MED ORDER — GADOBENATE DIMEGLUMINE 529 MG/ML IV SOLN
15.0000 mL | Freq: Once | INTRAVENOUS | Status: AC | PRN
  Administered 2015-02-03: 15 mL via INTRAVENOUS

## 2015-02-04 ENCOUNTER — Ambulatory Visit
Admission: RE | Admit: 2015-02-04 | Discharge: 2015-02-04 | Disposition: A | Discharge status: Home / Self Care | Payer: 59 | Source: Ambulatory Visit | Attending: Radiation Oncology | Admitting: Radiation Oncology

## 2015-02-04 DIAGNOSIS — C3492 Malignant neoplasm of unspecified part of left bronchus or lung: Secondary | ICD-10-CM | POA: Diagnosis not present

## 2015-02-04 DIAGNOSIS — C3490 Malignant neoplasm of unspecified part of unspecified bronchus or lung: Secondary | ICD-10-CM

## 2015-02-04 NOTE — Progress Notes (Signed)
  Radiation Oncology         [phone] ________________________________  Name: OTHMAR RINGER MRN: [id number]  Date: 02/04/2015  DOB: 01/21/1951  SIMULATION AND TREATMENT PLANNING NOTE    ICD-9-CM ICD-10-CM   1. Small cell lung cancer, unspecified laterality 162.9 C34.90     DIAGNOSIS:  Extensive Stage (T2, N1, M1b) Small cell lung Cancer diagnosed in October 2015.  NARRATIVE:  The patient was brought to the [place].  Identity was confirmed.  All relevant records and images related to the planned course of therapy were reviewed.  The patient freely provided informed written consent to proceed with treatment after reviewing the details related to the planned course of therapy. The consent form was witnessed and verified by the simulation staff.  Then, the patient was set-up in a stable reproducible  supine position for radiation therapy.  CT images were obtained.  Surface markings were placed.  The CT images were loaded into the planning software.  Then the target and avoidance structures were contoured.  Treatment planning then occurred.  The radiation prescription was entered and confirmed.  Then, I designed and supervised the construction of a total of 3 medically necessary complex treatment devices.  I have requested : Isodose Plan.  I have ordered:dose calc.  PLAN:  The patient will receive 25 Gy in 10 fractions directed at the whole brain as prophylaxis.  ________________________________  -----------------------------------  Blair Promise, PhD, MD

## 2015-02-08 ENCOUNTER — Encounter: Payer: Self-pay | Admitting: Emergency Medicine

## 2015-02-09 DIAGNOSIS — C3492 Malignant neoplasm of unspecified part of left bronchus or lung: Secondary | ICD-10-CM | POA: Diagnosis not present

## 2015-02-11 ENCOUNTER — Ambulatory Visit
Admission: RE | Admit: 2015-02-11 | Discharge: 2015-02-11 | Disposition: A | Discharge status: Home / Self Care | Payer: 59 | Source: Ambulatory Visit | Attending: Radiation Oncology | Admitting: Radiation Oncology

## 2015-02-11 DIAGNOSIS — C3490 Malignant neoplasm of unspecified part of unspecified bronchus or lung: Secondary | ICD-10-CM

## 2015-02-11 DIAGNOSIS — C3492 Malignant neoplasm of unspecified part of left bronchus or lung: Secondary | ICD-10-CM | POA: Diagnosis not present

## 2015-02-11 NOTE — Progress Notes (Signed)
  Radiation Oncology         [phone] ________________________________  Name: Spencer Long MRN: [id number]  Date: 02/11/2015  DOB: 1950/12/12  Simulation Verification Note    ICD-9-CM ICD-10-CM   1. Small cell lung cancer, unspecified laterality 162.9 C34.90     Status: outpatient  NARRATIVE: The patient was brought to the treatment unit and placed in the planned treatment position. The clinical setup was verified. Then port films were obtained and uploaded to the radiation oncology medical record software.  The treatment beams were carefully compared against the planned radiation fields. The position location and shape of the radiation fields was reviewed. They targeted volume of tissue appears to be appropriately covered by the radiation beams. Organs at risk appear to be excluded as planned.  Based on my personal review, I approved the simulation verification. The patient's treatment will proceed as planned.  -----------------------------------  Blair Promise, PhD, MD

## 2015-02-11 NOTE — Progress Notes (Signed)
Notified patient that Dr.Kinard will not prescribe steroid therapy unless patient is symptomatic.He may take compazine as needed for nausea.If has any problems tonight or in morning he may see doctor on Friday.

## 2015-02-12 ENCOUNTER — Ambulatory Visit
Admission: RE | Admit: 2015-02-12 | Discharge: 2015-02-12 | Disposition: A | Discharge status: Home / Self Care | Payer: 59 | Source: Ambulatory Visit | Attending: Radiation Oncology | Admitting: Radiation Oncology

## 2015-02-12 DIAGNOSIS — C3492 Malignant neoplasm of unspecified part of left bronchus or lung: Secondary | ICD-10-CM | POA: Diagnosis not present

## 2015-02-15 ENCOUNTER — Ambulatory Visit
Admission: RE | Admit: 2015-02-15 | Discharge: 2015-02-15 | Disposition: A | Discharge status: Home / Self Care | Payer: 59 | Source: Ambulatory Visit | Attending: Radiation Oncology | Admitting: Radiation Oncology

## 2015-02-15 DIAGNOSIS — C3492 Malignant neoplasm of unspecified part of left bronchus or lung: Secondary | ICD-10-CM | POA: Diagnosis not present

## 2015-02-16 ENCOUNTER — Ambulatory Visit
Admission: RE | Admit: 2015-02-16 | Discharge: 2015-02-16 | Disposition: A | Discharge status: Home / Self Care | Payer: 59 | Source: Ambulatory Visit | Attending: Radiation Oncology | Admitting: Radiation Oncology

## 2015-02-16 ENCOUNTER — Encounter: Payer: Self-pay | Admitting: Radiation Oncology

## 2015-02-16 VITALS — BP 131/67 | HR 75 | Temp 98.2°F | Ht 69.0 in | Wt 169.4 lb

## 2015-02-16 DIAGNOSIS — C3490 Malignant neoplasm of unspecified part of unspecified bronchus or lung: Secondary | ICD-10-CM

## 2015-02-16 DIAGNOSIS — C3492 Malignant neoplasm of unspecified part of left bronchus or lung: Secondary | ICD-10-CM | POA: Diagnosis not present

## 2015-02-16 MED ORDER — RADIAPLEXRX EX GEL: Freq: Once | CUTANEOUS | Status: DC

## 2015-02-16 NOTE — Progress Notes (Signed)
  Radiation Oncology         [phone] ________________________________  Name: Spencer Long MRN: [id number]  Date: 02/16/2015  DOB: 11-13-1950  Weekly Radiation Therapy Management  DIAGNOSIS: Extensive Stage (T2, N1, M1b) Small cell lung Cancer diagnosed in October 2015.  Current Dose: 10 Gy     Planned Dose:  25 Gy  directed at the whole brain as prophylaxis.  Narrative . . . . . . . . The patient presents for routine under treatment assessment.                                   The patient is without complaint. No nausea or headaches                                 Set-up films were reviewed.                                 The chart was checked. Physical Findings. . .  height is '5\' 9"'$  (1.753 m) and weight is 169 lb 6.4 oz (76.839 kg). His temperature is 98.2 F (36.8 C). His blood pressure is 131/67 and his pulse is 75. . Weight essentially stable.  No significant changes. No secondary infection in the oral cavity Impression . . . . . . . The patient is tolerating radiation. Plan . . . . . . . . . . . . Continue treatment as planned.  ________________________________   Blair Promise, PhD, MD

## 2015-02-16 NOTE — Progress Notes (Signed)
Mr. Pellecchia has received 4 fractions to his head.  Denies headaches, changes in vision, nor dizziness.  Has rash on his bilateral arms, hands , legs and posterior neck. No skin changes in the treatment field.

## 2015-02-17 ENCOUNTER — Ambulatory Visit
Admission: RE | Admit: 2015-02-17 | Discharge: 2015-02-17 | Disposition: A | Discharge status: Home / Self Care | Payer: 59 | Source: Ambulatory Visit | Attending: Radiation Oncology | Admitting: Radiation Oncology

## 2015-02-17 DIAGNOSIS — C3492 Malignant neoplasm of unspecified part of left bronchus or lung: Secondary | ICD-10-CM | POA: Diagnosis not present

## 2015-02-18 ENCOUNTER — Ambulatory Visit: Admission: RE | Admit: 2015-02-18 | Payer: 59 | Source: Ambulatory Visit

## 2015-02-19 ENCOUNTER — Ambulatory Visit
Admission: RE | Admit: 2015-02-19 | Discharge: 2015-02-19 | Disposition: A | Discharge status: Home / Self Care | Payer: 59 | Source: Ambulatory Visit | Attending: Radiation Oncology | Admitting: Radiation Oncology

## 2015-02-19 DIAGNOSIS — C3492 Malignant neoplasm of unspecified part of left bronchus or lung: Secondary | ICD-10-CM | POA: Diagnosis not present

## 2015-02-22 ENCOUNTER — Ambulatory Visit
Admission: RE | Admit: 2015-02-22 | Discharge: 2015-02-22 | Disposition: A | Discharge status: Home / Self Care | Payer: 59 | Source: Ambulatory Visit | Attending: Radiation Oncology | Admitting: Radiation Oncology

## 2015-02-22 DIAGNOSIS — C3492 Malignant neoplasm of unspecified part of left bronchus or lung: Secondary | ICD-10-CM | POA: Diagnosis not present

## 2015-02-23 ENCOUNTER — Ambulatory Visit
Admission: RE | Admit: 2015-02-23 | Discharge: 2015-02-23 | Disposition: A | Discharge status: Home / Self Care | Payer: 59 | Source: Ambulatory Visit | Attending: Radiation Oncology | Admitting: Radiation Oncology

## 2015-02-23 ENCOUNTER — Encounter: Payer: Self-pay | Admitting: Radiation Oncology

## 2015-02-23 VITALS — BP 134/65 | HR 80 | Temp 98.5°F | Resp 16 | Ht 69.0 in | Wt 167.1 lb

## 2015-02-23 DIAGNOSIS — C3490 Malignant neoplasm of unspecified part of unspecified bronchus or lung: Secondary | ICD-10-CM

## 2015-02-23 DIAGNOSIS — C3492 Malignant neoplasm of unspecified part of left bronchus or lung: Secondary | ICD-10-CM | POA: Diagnosis not present

## 2015-02-23 MED ORDER — BIAFINE EX EMUL: Freq: Once | CUTANEOUS | Status: DC

## 2015-02-23 NOTE — Progress Notes (Signed)
Spencer Long has completed 8 fractions to his whole brain.  He denies pain, dizziness, balance issues, nausea and vision changes.  The skin on his forehead is red.  He has been given biafine cream and instructed to him to apply it after treatment and bedtime.  BP 134/65 mmHg  Pulse 80  Temp(Src) 98.5 F (36.9 C) (Oral)  Resp 16  Ht '5\' 9"'$  (1.753 m)  Wt 167 lb 1.6 oz (75.796 kg)  BMI 24.67 kg/m2  SpO2 98%

## 2015-02-23 NOTE — Progress Notes (Signed)
Pt here for patient teaching.  Pt given Radiation and You booklet. Reviewed areas of pertinence such as fatigue, hair loss, nausea and vomiting and skin changes . Pt demonstrated understanding and verbalizes understanding of information given and will contact nursing with any questions or concerns.

## 2015-02-23 NOTE — Progress Notes (Signed)
  Radiation Oncology         [phone] ________________________________  Name: Spencer Long MRN: [id number]  Date: 02/23/2015  DOB: 1950-12-25  Weekly Radiation Therapy Management  DIAGNOSIS: Extensive Stage (T2, N1, M1b) Small cell lung Cancer diagnosed in October 2015.  Current Dose: 20 Gy     Planned Dose:  25 Gy  Narrative . . . . . . . . The patient presents for routine under treatment assessment.                                   The patient is without complaint. He denies any nausea or headaches. His skin reaction is better after his primary care physician placed him on prednisone                                 Set-up films were reviewed.                                 The chart was checked. Physical Findings. . .  height is '5\' 9"'$  (1.753 m) and weight is 167 lb 1.6 oz (75.796 kg). His oral temperature is 98.5 F (36.9 C). His blood pressure is 134/65 and his pulse is 80. His respiration is 16 and oxygen saturation is 98%. . The lungs are clear. The heart has regular rhythm and rate. Minimal scalp irritation along the forehead area. The oral cavity is moist without secondary infection Impression . . . . . . . The patient is tolerating radiation. Plan . . . . . . . . . . . . Continue treatment as planned.  ________________________________   Blair Promise, PhD, MD

## 2015-02-23 NOTE — Addendum Note (Signed)
Encounter addended by: Jacqulyn Liner, RN on: 02/23/2015 11:28 AM<BR>     Documentation filed: Notes Section

## 2015-02-24 ENCOUNTER — Ambulatory Visit
Admission: RE | Admit: 2015-02-24 | Discharge: 2015-02-24 | Disposition: A | Discharge status: Home / Self Care | Payer: 59 | Source: Ambulatory Visit | Attending: Radiation Oncology | Admitting: Radiation Oncology

## 2015-02-24 ENCOUNTER — Ambulatory Visit: Payer: 59

## 2015-02-24 DIAGNOSIS — C3492 Malignant neoplasm of unspecified part of left bronchus or lung: Secondary | ICD-10-CM | POA: Diagnosis not present

## 2015-02-25 ENCOUNTER — Ambulatory Visit: Payer: 59

## 2015-02-25 ENCOUNTER — Encounter: Payer: Self-pay | Admitting: Radiation Oncology

## 2015-02-25 ENCOUNTER — Ambulatory Visit
Admission: RE | Admit: 2015-02-25 | Discharge: 2015-02-25 | Disposition: A | Discharge status: Home / Self Care | Payer: 59 | Source: Ambulatory Visit | Attending: Radiation Oncology | Admitting: Radiation Oncology

## 2015-02-25 DIAGNOSIS — C3492 Malignant neoplasm of unspecified part of left bronchus or lung: Secondary | ICD-10-CM | POA: Diagnosis not present

## 2015-03-04 NOTE — Progress Notes (Signed)
  Radiation Oncology         [phone] ________________________________  Name: Spencer Long MRN: [id number]  Date: 02/25/2015  DOB: 02-18-1951  End of Treatment Note   ICD-9-CM ICD-10-CM    1. Small cell lung cancer, unspecified laterality 162.9 C34.90     DIAGNOSIS: Extensive Stage (T2, N1, M1b) Small cell lung Cancer diagnosed in October 2015.     Indication for treatment:  Prophylaxis against brain metastasis       Radiation treatment dates:   02/11/2015-02/25/2015  Site/dose:   Brain, 25 Gy in 10 fxs  Beams/energy:   Laterals, 6 MV photons  Narrative: The patient tolerated radiation treatment relatively well.   Mild scalp irritation,  Plan: The patient has completed radiation treatment. The patient will return to radiation oncology clinic for routine followup in one month. I advised them to call or return sooner if they have any questions or concerns related to their recovery or treatment.  -----------------------------------  Blair Promise, PhD, MD

## 2015-03-07 ENCOUNTER — Other Ambulatory Visit: Payer: Self-pay | Admitting: Emergency Medicine

## 2015-03-25 ENCOUNTER — Encounter: Payer: Self-pay | Admitting: Emergency Medicine

## 2015-03-25 ENCOUNTER — Ambulatory Visit (INDEPENDENT_AMBULATORY_CARE_PROVIDER_SITE_OTHER): Payer: 59 | Admitting: Emergency Medicine

## 2015-03-25 VITALS — BP 120/70 | HR 83 | Wt 167.0 lb

## 2015-03-25 DIAGNOSIS — Z87891 Personal history of nicotine dependence: Secondary | ICD-10-CM | POA: Diagnosis not present

## 2015-03-25 DIAGNOSIS — D869 Sarcoidosis, unspecified: Secondary | ICD-10-CM | POA: Diagnosis not present

## 2015-03-25 DIAGNOSIS — C3490 Malignant neoplasm of unspecified part of unspecified bronchus or lung: Secondary | ICD-10-CM | POA: Diagnosis not present

## 2015-03-25 DIAGNOSIS — Z720 Tobacco use: Secondary | ICD-10-CM | POA: Diagnosis not present

## 2015-03-25 DIAGNOSIS — F17200 Nicotine dependence, unspecified, uncomplicated: Secondary | ICD-10-CM

## 2015-03-25 NOTE — Assessment & Plan Note (Signed)
Discussed his tobacco history with him today. He continues to be abstinent since November 2015

## 2015-03-25 NOTE — Assessment & Plan Note (Signed)
Following with Dr. Sondra Come and [name]. Planning for repeat imaging in June. He is currently in remission

## 2015-03-25 NOTE — Progress Notes (Signed)
Subjective:    Patient ID: Spencer Long, male    DOB: Jul 11, 1951, 64 y.o.   MRN: [id number]  HPI  08/31/2014 routine office visit> Spencer Long is here to see me today to follow-up from his bronchoscopy which was performed by my partner last week. The biopsy from the lymph node as well as the left lower lobe mass demonstrated small cell lung cancer. He is supposed to see his new oncologist this week.  Today he primarily has questions about smoking cessation. He continues to smoke 1-1/2 packs of cigarettes daily and wants to quit. He had very severe side effects from Chantix and does not want to take that again. He has not taken nicotine replacement. He also has questions about whether or not the rash on his arm could be related to his malignancy.  Follow up visit s/p FOB 11/02/14 -- follow-up visit following recent bronchoscopy, nodal biopsy and left lower lobe biopsy that showed small cell lung cancer. He has received chemo x3, CT scan 12/28 shows that he is responding.  He has done well post-procedure. HE HASN'T SMOKED SINCE 09/14/14!! His cough is better, has done well through the chemo.   ROV 03/25/15 -- follow-up visit for history of tobacco abuse, sarcoidosis and small cell lung cancer. He is in remission.  Planning to have repeat scan in June. He describes fatigue with his current XRT, some exertional SOB. No cough, no wheeze.  Notes from Oncology and rad onc reviewed. No PFT on record. I reviewed his spirometry from 10/15. I reviewed his CT scan from 01/2015 myself/.   Past Medical History  Diagnosis Date  . Hypertension     under control, has been on med. x 15-20 yrs.  [name] GERD (gastroesophageal reflux disease)   . Infection of elbow     right-no infection at present , but has skin lesions present  . Sarcoidosis     "tends to have skin flareups" and presently experiencing now"feet, legs and arms"  . Chronic hyponatremia     Spencer Long follows  . Lung cancer      Review of  Systems     Objective:   Physical Exam  Filed Vitals:   03/25/15 1526  BP: 120/70  Pulse: 83  Weight: 167 lb (75.751 kg)  SpO2: 92%   Gen: well appearing, no acute distress HEENT: NCAT, EOMi, OP clear,  PULM: no wheeze CV: RRR, no mgr, no JVD AB: BS+, soft, nontender Ext: warm, no edema, no clubbing, no cyanosis Derm: no rash or skin breakdown Neuro: A&Ox4, MAEW     Assessment & Plan:   History of tobacco abuse Discussed his tobacco history with him today. He continues to be abstinent since November 2015   Sarcoidosis No evidence of active disease at this time. We will continue to follow him closely. He will have a chest imaging frequently as part  Of his oncology evaluation   Small cell lung cancer Following with Spencer Long and Spencer Long. Planning for repeat imaging in June. He is currently in remission     Updated Medication List Outpatient Encounter Prescriptions as of 03/25/2015  Medication Sig  . amLODipine (NORVASC) 10 MG tablet Take 10 mg by mouth every morning.   [name] aspirin EC 81 MG tablet Take 81 mg by mouth every morning.   [name] atenolol (TENORMIN) 25 MG tablet Take 25 mg by mouth 2 (two) times daily.    . Azilsartan Medoxomil (EDARBI) 80 MG TABS Take 1 tablet by mouth  every morning.  [name] buPROPion (WELLBUTRIN SR) 150 MG 12 hr tablet TAKE ONE TABLET BY MOUTH TWICE DAILY  . diclofenac (VOLTAREN) 75 MG EC tablet Take 75 mg by mouth 2 (two) times daily.  [name] dicyclomine (BENTYL) 20 MG tablet Take 20 mg by mouth every 4 (four) hours as needed for spasms.   . folic acid (FOLVITE) 1 MG tablet Take 1 mg by mouth 2 (two) times daily.  . Multiple Vitamins-Minerals (MULTIVITAMIN ADULTS 50+ PO) Take 1 tablet by mouth every morning.   . mupirocin cream (BACTROBAN) 2 % Apply 1 application topically 2 (two) times daily.  . nicotine (NICODERM CQ - DOSED IN MG/24 HOURS) 14 mg/24hr patch Place 1 patch (14 mg total) onto the skin daily.  [name] omeprazole (PRILOSEC) 40 MG capsule Take 40  mg by mouth every morning.  . Probiotic Product (PROBIOTIC DAILY PO) Take 1 tablet by mouth every morning.   . sodium chloride 1 G tablet Take 1 g by mouth 3 (three) times daily.  . white petrolatum (VASELINE) GEL Apply 1 application topically daily as needed for lip care or dry skin.  . [DISCONTINUED] calcipotriene-betamethasone (TACLONEX) ointment Apply 1 application topically daily.  . [DISCONTINUED] clobetasol cream (TEMOVATE) 3.64 % Apply 1 application topically 2 (two) times daily as needed.  . [DISCONTINUED] ondansetron (ZOFRAN) 8 MG tablet Take 1 tablet (8 mg total) by mouth every 8 (eight) hours as needed for nausea or vomiting (start on 3rd day after chemo). (Patient not taking: Reported on 12/28/2014)  . [DISCONTINUED] predniSONE (DELTASONE) 1 MG tablet Take 10 mg by mouth daily with breakfast.  . [DISCONTINUED] prochlorperazine (COMPAZINE) 10 MG tablet Take 1 tablet (10 mg total) by mouth every 6 (six) hours as needed for nausea. (Patient not taking: Reported on 02/16/2015)   No facility-administered encounter medications on file as of 03/25/2015.

## 2015-03-25 NOTE — Assessment & Plan Note (Addendum)
No evidence of active disease at this time. We will continue to follow him closely. He will have a chest imaging frequently as part  Of his oncology evaluation

## 2015-03-25 NOTE — Patient Instructions (Signed)
Please follow up with Dr. Lamonte Sakai in 6-8 weeks with full pulmonary function testing on the same day

## 2015-03-30 ENCOUNTER — Encounter: Payer: Self-pay | Admitting: Oncology

## 2015-04-01 ENCOUNTER — Ambulatory Visit
Admission: RE | Admit: 2015-04-01 | Discharge: 2015-04-01 | Disposition: A | Discharge status: Home / Self Care | Payer: 59 | Source: Ambulatory Visit | Attending: Radiation Oncology | Admitting: Radiation Oncology

## 2015-04-01 ENCOUNTER — Encounter: Payer: Self-pay | Admitting: Radiation Oncology

## 2015-04-01 VITALS — BP 131/73 | HR 76 | Resp 16 | Wt 168.5 lb

## 2015-04-01 DIAGNOSIS — L599 Disorder of the skin and subcutaneous tissue related to radiation, unspecified: Secondary | ICD-10-CM | POA: Insufficient documentation

## 2015-04-01 DIAGNOSIS — R0602 Shortness of breath: Secondary | ICD-10-CM | POA: Diagnosis not present

## 2015-04-01 DIAGNOSIS — Z923 Personal history of irradiation: Secondary | ICD-10-CM | POA: Insufficient documentation

## 2015-04-01 DIAGNOSIS — C3490 Malignant neoplasm of unspecified part of unspecified bronchus or lung: Secondary | ICD-10-CM

## 2015-04-01 DIAGNOSIS — Z7982 Long term (current) use of aspirin: Secondary | ICD-10-CM | POA: Diagnosis not present

## 2015-04-01 DIAGNOSIS — R5383 Other fatigue: Secondary | ICD-10-CM | POA: Diagnosis not present

## 2015-04-01 DIAGNOSIS — Z79899 Other long term (current) drug therapy: Secondary | ICD-10-CM | POA: Insufficient documentation

## 2015-04-01 DIAGNOSIS — Z08 Encounter for follow-up examination after completed treatment for malignant neoplasm: Secondary | ICD-10-CM | POA: Insufficient documentation

## 2015-04-01 DIAGNOSIS — Z85118 Personal history of other malignant neoplasm of bronchus and lung: Secondary | ICD-10-CM | POA: Diagnosis not present

## 2015-04-01 MED ORDER — BIAFINE EX EMUL
Freq: Every day | CUTANEOUS | Status: DC
  Administered 2015-04-01: 09:00:00 via TOPICAL

## 2015-04-01 NOTE — Progress Notes (Signed)
Radiation Oncology         [phone]) [phone] ________________________________  Name: Spencer Long MRN: [id number]  Date: 04/01/2015  DOB: 09-Apr-1951  Follow-Up Visit Note  CC: [name], MD  Curt Bears, MD  No diagnosis found.  Diagnosis:  Extensive Stage (T2, N1, M1b) Small cell lung Cancer diagnosed in October 2015.  Interval Since Last Radiation:  5 weeks  Narrative:  Spencer Long is a 64 year old male who presents to clinic for routine follow-up appointment.  Denies pain. Reports SOB. Denies cough. Denies difficulty swallowing. Denies headache, dizziness, vomiting, or pain in the chest area. Reports nausea following his morning Ensure. Riding in wheelchair for today's appointment (04/01/2015) due to fatigue. Mild hyperpigmentation of frontal scalp noted without desquamation. Continues to use Biafine and hydrocortisone on affected area. "No vision problems but I feel that the vision has changed," was Theurer's response when asked about current vision status. Skin is still red and has been unusually red, especially in the forehead area since treatment. Wife reports fluid coming from patients left ear, but no symptoms of hearing loss. Wife reports her husband has recently experienced severe mood swings, out of the norm in regards to his typical behavior.            ALLERGIES:  is allergic to varenicline; isothiazolinone chloride; oxycodone; and quaternium-15.  Meds: Current Outpatient Prescriptions  Medication Sig Dispense Refill  . amLODipine (NORVASC) 10 MG tablet Take 10 mg by mouth every morning.     [name] aspirin EC 81 MG tablet Take 81 mg by mouth every morning.     [name] atenolol (TENORMIN) 25 MG tablet Take 25 mg by mouth 2 (two) times daily.      . Azilsartan Medoxomil (EDARBI) 80 MG TABS Take 1 tablet by mouth every morning.    [name] buPROPion (WELLBUTRIN SR) 150 MG 12 hr tablet TAKE ONE TABLET BY MOUTH TWICE DAILY 60 tablet 0  . diclofenac (VOLTAREN) 75 MG EC  tablet Take 75 mg by mouth 2 (two) times daily.    [name] dicyclomine (BENTYL) 20 MG tablet Take 20 mg by mouth every 4 (four) hours as needed for spasms.     . folic acid (FOLVITE) 1 MG tablet Take 1 mg by mouth 2 (two) times daily.    . hydrocortisone cream 1 % Apply 1 application topically 2 (two) times daily.    . Multiple Vitamins-Minerals (MULTIVITAMIN ADULTS 50+ PO) Take 1 tablet by mouth every morning.     . mupirocin cream (BACTROBAN) 2 % Apply 1 application topically 2 (two) times daily.    . nicotine (NICODERM CQ - DOSED IN MG/24 HOURS) 14 mg/24hr patch Place 1 patch (14 mg total) onto the skin daily. 28 patch 2  . omeprazole (PRILOSEC) 40 MG capsule Take 40 mg by mouth every morning.    . Probiotic Product (PROBIOTIC DAILY PO) Take 1 tablet by mouth every morning.     . sodium chloride 1 G tablet Take 1 g by mouth 3 (three) times daily.    . white petrolatum (VASELINE) GEL Apply 1 application topically daily as needed for lip care or dry skin.     No current facility-administered medications for this encounter.   Indication for treatment:  Prophylaxis against brain metastasis Radiation treatment dates: 02/11/2015-02/25/2015 Site/dose:   Brain, 25 Gy in 10 fxs. History of high frequency hearing loss.   Physical Findings: The patient is in no acute distress. Patient is alert  and oriented.  weight is 168 lb 8 oz (76.431 kg). His blood pressure is 131/73 and his pulse is 76. His respiration is 16 and oxygen saturation is 98%. .  No significant changes.  Lungs are clear. Heart has regular rate and rhythm. No palpable cervical, supraclavicular, or axillary adenopathy. The left tympanic membrane was slightly cloudy, possibly some fluid behind the left ear drum. Right ear is clear.    Lab Findings: Lab Results  Component Value Date   WBC 8.3 01/18/2015   HGB 12.1* 01/18/2015   HCT 34.7* 01/18/2015   MCV 100.6* 01/18/2015   PLT 250 01/18/2015    Radiographic Findings: No results  found.  Impression:  The patient is recovering from the effects of radiation. Patient will call back if he wishes to pursue the ENT evaluation. I doubt he will require a tympanostomy tube at this time but will continue to follow this issue  Plan:  Scheduled to follow up with Dr. [name] 04/26/2015 and Dr. Lamonte Sakai 05/21/2015. CT chest scheduled for 04/19/2015.  Breathing appointment scheduled for July of 2016. Pulmonary function test scheduled for next month as well. Prescribed Biafine cream for dry scalp. Advised about follow up appointment in three months (September 2016).   This document serves as a record of services personally performed by Gery Pray, MD. It was created on his behalf by Lenn Cal, a trained medical scribe. The creation of this record is based on the scribe's personal observations and the provider's statements to them. This document has been checked and approved by the attending provider.    ____________________________________ -----------------------------------  Blair Promise, PhD, MD

## 2015-04-01 NOTE — Progress Notes (Signed)
Weight and vitals stable. Denies pain. Reports SOB. Breathing study planned for July. Denies cough. Denies difficulty swallowing. Denies headache, dizziness, or vomiting. Reports nausea following his morning Ensure. Riding in wheelchair today due to fatigue. Mild hyperpigmentation of frontal scalp noted without desquamation. Continues to use Biafine and hydrocortisone on affected area. Scheduled to follow up with Dr. [name] 6/27 and Dr. Lamonte Sakai 7/22. CT chest scheduled for 6/20.  BP 131/73 mmHg  Pulse 76  Resp 16  Wt 168 lb 8 oz (76.431 kg)  SpO2 98% Wt Readings from Last 3 Encounters:  03/25/15 167 lb (75.751 kg)  02/03/15 161 lb (73.029 kg)  01/18/15 161 lb 1.6 oz (73.074 kg)

## 2015-04-04 ENCOUNTER — Other Ambulatory Visit: Payer: Self-pay | Admitting: Emergency Medicine

## 2015-04-12 ENCOUNTER — Other Ambulatory Visit (HOSPITAL_BASED_OUTPATIENT_CLINIC_OR_DEPARTMENT_OTHER): Payer: 59

## 2015-04-12 DIAGNOSIS — C7A1 Malignant poorly differentiated neuroendocrine tumors: Secondary | ICD-10-CM

## 2015-04-12 DIAGNOSIS — C3492 Malignant neoplasm of unspecified part of left bronchus or lung: Secondary | ICD-10-CM

## 2015-04-12 LAB — CBC WITH DIFFERENTIAL/PLATELET
BASO%: 0.7 % (ref 0.0–2.0)
Basophils Absolute: 0 10*3/uL (ref 0.0–0.1)
EOS%: 5.9 % (ref 0.0–7.0)
Eosinophils Absolute: 0.4 10*3/uL (ref 0.0–0.5)
HEMATOCRIT: 36.9 % — AB (ref 38.4–49.9)
HGB: 12.8 g/dL — ABNORMAL LOW (ref 13.0–17.1)
LYMPH%: 14.3 % (ref 14.0–49.0)
MCH: 32.4 pg (ref 27.2–33.4)
MCHC: 34.5 g/dL (ref 32.0–36.0)
MCV: 93.7 fL (ref 79.3–98.0)
MONO#: 0.9 10*3/uL (ref 0.1–0.9)
MONO%: 14.2 % — AB (ref 0.0–14.0)
NEUT#: 3.9 10*3/uL (ref 1.5–6.5)
NEUT%: 64.9 % (ref 39.0–75.0)
PLATELETS: 305 10*3/uL (ref 140–400)
RBC: 3.94 10*6/uL — AB (ref 4.20–5.82)
RDW: 12 % (ref 11.0–14.6)
WBC: 6.1 10*3/uL (ref 4.0–10.3)
lymph#: 0.9 10*3/uL (ref 0.9–3.3)

## 2015-04-12 LAB — COMPREHENSIVE METABOLIC PANEL (CC13)
ALBUMIN: 4 g/dL (ref 3.5–5.0)
ALT: 36 U/L (ref 0–55)
AST: 22 U/L (ref 5–34)
Alkaline Phosphatase: 70 U/L (ref 40–150)
Anion Gap: 9 mEq/L (ref 3–11)
BUN: 21.8 mg/dL (ref 7.0–26.0)
CALCIUM: 9 mg/dL (ref 8.4–10.4)
CO2: 24 meq/L (ref 22–29)
CREATININE: 1 mg/dL (ref 0.7–1.3)
Chloride: 102 mEq/L (ref 98–109)
EGFR: 80 mL/min/{1.73_m2} — ABNORMAL LOW (ref >90)
Glucose: 88 mg/dl (ref 70–140)
Potassium: 5.3 mEq/L — ABNORMAL HIGH (ref 3.5–5.1)
Sodium: 135 mEq/L — ABNORMAL LOW (ref 136–145)
Total Bilirubin: 0.45 mg/dL (ref 0.20–1.20)
Total Protein: 6.3 g/dL — ABNORMAL LOW (ref 6.4–8.3)

## 2015-04-12 LAB — MAGNESIUM (CC13): Magnesium: 2.3 mg/dl (ref 1.5–2.5)

## 2015-04-19 ENCOUNTER — Encounter (HOSPITAL_COMMUNITY): Payer: Self-pay

## 2015-04-19 ENCOUNTER — Other Ambulatory Visit: Payer: 59

## 2015-04-19 ENCOUNTER — Ambulatory Visit (HOSPITAL_COMMUNITY)
Admission: RE | Admit: 2015-04-19 | Discharge: 2015-04-19 | Disposition: A | Discharge status: Home / Self Care | Payer: 59 | Source: Ambulatory Visit | Attending: Internal Medicine | Admitting: Internal Medicine

## 2015-04-19 DIAGNOSIS — C3490 Malignant neoplasm of unspecified part of unspecified bronchus or lung: Secondary | ICD-10-CM | POA: Diagnosis present

## 2015-04-19 DIAGNOSIS — Z9221 Personal history of antineoplastic chemotherapy: Secondary | ICD-10-CM | POA: Insufficient documentation

## 2015-04-19 DIAGNOSIS — D869 Sarcoidosis, unspecified: Secondary | ICD-10-CM | POA: Diagnosis not present

## 2015-04-19 MED ORDER — IOHEXOL 300 MG/ML  SOLN
100.0000 mL | Freq: Once | INTRAMUSCULAR | Status: AC | PRN
  Administered 2015-04-19: 100 mL via INTRAVENOUS

## 2015-04-26 ENCOUNTER — Ambulatory Visit (HOSPITAL_BASED_OUTPATIENT_CLINIC_OR_DEPARTMENT_OTHER): Payer: 59 | Admitting: Internal Medicine

## 2015-04-26 ENCOUNTER — Telehealth: Payer: Self-pay | Admitting: Internal Medicine

## 2015-04-26 ENCOUNTER — Encounter: Payer: Self-pay | Admitting: Internal Medicine

## 2015-04-26 VITALS — BP 140/66 | HR 90 | Temp 97.8°F | Resp 18 | Ht 69.0 in | Wt 165.4 lb

## 2015-04-26 DIAGNOSIS — C7A1 Malignant poorly differentiated neuroendocrine tumors: Secondary | ICD-10-CM

## 2015-04-26 DIAGNOSIS — C3490 Malignant neoplasm of unspecified part of unspecified bronchus or lung: Secondary | ICD-10-CM

## 2015-04-26 NOTE — Progress Notes (Signed)
[facility] Telephone:(336) [phone]   Fax:(336) [place], [place] [address]  DIAGNOSIS: Extensive Stage (T2, N1, M1b) Small cell lung Cancer diagnosed in October 2015.  PRIOR THERAPY:  1) Systemic chemotherapy with cisplatin 60 MG/M2 on day 1 and etoposide 120 MG/M2 on days 1, 2 and 3 with Neulasta support on day 4, status post 6 cycles. 2) prophylactic cranial irradiation under the care of Dr. Sondra Come  CURRENT THERAPY: Observation.  INTERVAL HISTORY: GERRETT LOMAN 64 y.o. male returns to the clinic today for follow-up visit accompanied by his wife. The patient is feeling fine today with no specific complaints except for intermittent diarrhea. He recently completed prophylactic cranial irradiation under the care of Dr. Sondra Come. He denied having any significant chest pain, shortness of breath, cough or hemoptysis. The patient denied having any nausea or vomiting, no fever or chills. He had repeat CT scan of the chest, abdomen and pelvis performed recently and he is here for evaluation and discussion of his scan results.  MEDICAL HISTORY: Past Medical History  Diagnosis Date  . Hypertension     under control, has been on med. x 15-20 yrs.  [name] GERD (gastroesophageal reflux disease)   . Infection of elbow     right-no infection at present , but has skin lesions present  . Sarcoidosis     "tends to have skin flareups" and presently experiencing now"feet, legs and arms"  . Chronic hyponatremia     Dr. Buddy Duty follows  . Radiation 02/11/15-02/25/15    brain 25 Gy  . Lung cancer     ALLERGIES:  is allergic to varenicline; isothiazolinone chloride; oxycodone; and quaternium-15.  MEDICATIONS:  Current Outpatient Prescriptions  Medication Sig Dispense Refill  . amLODipine (NORVASC) 10 MG tablet Take 10 mg by mouth every morning.     [name] aspirin EC 81 MG tablet Take 81 mg by mouth every  morning.     [name] atenolol (TENORMIN) 25 MG tablet Take 25 mg by mouth 2 (two) times daily.      . Azilsartan Medoxomil (EDARBI) 80 MG TABS Take 1 tablet by mouth every morning.    [name] buPROPion (WELLBUTRIN SR) 150 MG 12 hr tablet TAKE ONE TABLET BY MOUTH TWICE DAILY. 60 tablet 5  . diclofenac (VOLTAREN) 75 MG EC tablet Take 75 mg by mouth 2 (two) times daily.    [name] dicyclomine (BENTYL) 20 MG tablet Take 20 mg by mouth every 4 (four) hours as needed for spasms.     . folic acid (FOLVITE) 1 MG tablet Take 1 mg by mouth 2 (two) times daily.    . hydrocortisone cream 1 % Apply 1 application topically 2 (two) times daily.    . Multiple Vitamins-Minerals (MULTIVITAMIN ADULTS 50+ PO) Take 1 tablet by mouth every morning.     . mupirocin cream (BACTROBAN) 2 % Apply 1 application topically 2 (two) times daily.    . nicotine (NICODERM CQ - DOSED IN MG/24 HOURS) 14 mg/24hr patch Place 1 patch (14 mg total) onto the skin daily. 28 patch 2  . omeprazole (PRILOSEC) 40 MG capsule Take 40 mg by mouth every morning.    . Probiotic Product (PROBIOTIC DAILY PO) Take 1 tablet by mouth every morning.     . sodium chloride 1 G tablet Take 1 g by mouth 3 (three) times daily.    . white petrolatum (VASELINE) GEL Apply  1 application topically daily as needed for lip care or dry skin.     No current facility-administered medications for this visit.    SURGICAL HISTORY:  Past Surgical History  Procedure Laterality Date  . Cholecystectomy  05/2006  . Ulnar nerve repair  01/2008    right; decompression  . Carpal tunnel release  01/2008    right  . Eye surgery  12/2008    correction ectropion, release band, wedge exc., shortening  . I&d extremity  09/12/2011    Procedure: IRRIGATION AND DEBRIDEMENT EXTREMITY;  Surgeon: Tennis Must;  Location: [facility];  Service: Orthopedics;  Laterality: Right;  I&d RIGHT OLECRANON BURSSA   . Tonsillectomy    . Endobronchial ultrasound Bilateral 08/24/2014     Procedure: ENDOBRONCHIAL ULTRASOUND;  Surgeon: Collene Gobble, MD;  Location: [facility];  Service: Cardiopulmonary;  Laterality: Bilateral;    REVIEW OF SYSTEMS:  A comprehensive review of systems was negative except for: Constitutional: positive for fatigue Gastrointestinal: positive for diarrhea   PHYSICAL EXAMINATION: General appearance: alert, cooperative, fatigued and no distress Head: Normocephalic, without obvious abnormality, atraumatic Neck: no adenopathy, no JVD, supple, symmetrical, trachea midline and thyroid not enlarged, symmetric, no tenderness/mass/nodules Lymph nodes: Cervical, supraclavicular, and axillary nodes normal. Resp: clear to auscultation bilaterally Back: symmetric, no curvature. ROM normal. No CVA tenderness. Cardio: regular rate and rhythm, S1, S2 normal, no murmur, click, rub or gallop GI: soft, non-tender; bowel sounds normal; no masses,  no organomegaly Extremities: extremities normal, atraumatic, no cyanosis or edema Neurologic: Alert and oriented X 3, normal strength and tone. Normal symmetric reflexes. Normal coordination and gait  ECOG PERFORMANCE STATUS: 1 - Symptomatic but completely ambulatory  Blood pressure 140/66, pulse 90, temperature 97.8 F (36.6 C), temperature source Oral, resp. rate 18, height '5\' 9"'$  (1.753 m), weight 165 lb 6.4 oz (75.025 kg), SpO2 98 %.  LABORATORY DATA: Lab Results  Component Value Date   WBC 6.1 04/12/2015   HGB 12.8* 04/12/2015   HCT 36.9* 04/12/2015   MCV 93.7 04/12/2015   PLT 305 04/12/2015      Chemistry      Component Value Date/Time   NA 135* 04/12/2015 0856   NA 125* 09/12/2011 1302   K 5.3* 04/12/2015 0856   K 5.5* 09/12/2011 1302   CL 95* 09/12/2011 1302   CO2 24 04/12/2015 0856   CO2 26 12/25/2008 1425   BUN 21.8 04/12/2015 0856   BUN 19 09/12/2011 1302   CREATININE 1.0 04/12/2015 0856   CREATININE 1.00 09/12/2011 1302      Component Value Date/Time   CALCIUM 9.0 04/12/2015 0856    CALCIUM 9.1 12/25/2008 1425   ALKPHOS 70 04/12/2015 0856   AST 22 04/12/2015 0856   ALT 36 04/12/2015 0856   BILITOT 0.45 04/12/2015 0856       RADIOGRAPHIC STUDIES: Ct Chest W Contrast  04/19/2015   CLINICAL DATA:  Small cell lung cancer (left lower lobe, infrahilar) diagnosed in October 2015. Chemotherapy completed. Sarcoidosis.  EXAM: CT CHEST, ABDOMEN, AND PELVIS WITH CONTRAST  TECHNIQUE: Multidetector CT imaging of the chest, abdomen and pelvis was performed following the standard protocol during bolus administration of intravenous contrast.  CONTRAST:  120m OMNIPAQUE IOHEXOL 300 MG/ML  SOLN  COMPARISON:  Multiple exams, including 09/11/2014  FINDINGS: CT CHEST FINDINGS  Mediastinum/Nodes: Coronary, aortic arch, and branch vessel atherosclerotic vascular disease. There is some proximal luminal narrowing due to mural thrombus in the left common carotid artery.  Right hilar  lymph node, 0.9 cm in short axis, formerly 1.0 cm.  Lungs/Pleura: As noted on prior, there is plugging of the lateral basal and posterior basal segmental bronchi extending over a relatively long segment, associated with the left infrahilar lesion in this vicinity which measures 1 cm in diameter on image 42 series 4 (formerly the same). Associated mild atelectasis in the left lower lobe. No progression.  Musculoskeletal: Degenerative glenohumeral arthropathy, right greater than left. Upper thoracic degenerative disc disease.  CT ABDOMEN PELVIS FINDINGS  Hepatobiliary: Mild diffuse hepatic steatosis.  Cholecystectomy.  Pancreas: Unremarkable  Spleen: Unremarkable  Adrenals/Urinary Tract: Vascular calcifications in the right renal hilum.  Stomach/Bowel: Descending and sigmoid colon diverticulosis.  Vascular/Lymphatic: Aortoiliac atherosclerotic vascular disease. There is some very fairly striking calcific plaque at the origin of the left renal artery. 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  Reproductive: Minimally prominent prostate gland indents the bladder base.  Other: No supplemental non-categorized findings.  Musculoskeletal: Small bilateral inguinal hernias contain adipose tissue. Lower lumbar degenerative disc disease and spondylosis, with mild right foraminal stenosis at L4-5.  IMPRESSION: 1. Stable 10 mm left infrahilar lesion associated with plugging extending in the lateral basal and posterior basal segmental bronchi of the left lower lobe. No change in this appearance compared to 01/18/15. 2. Considerable atherosclerosis. 3. Degenerative findings in the spine and in the glenohumeral joint. 4. Descending and sigmoid colon diverticulosis. 5. Mild diffuse hepatic steatosis.   Electronically Signed   By: Van Clines M.D.   On: 04/19/2015 09:43   Ct Abdomen Pelvis W Contrast  04/19/2015   CLINICAL DATA:  Small cell lung cancer (left lower lobe, infrahilar) diagnosed in October 2015. Chemotherapy completed. Sarcoidosis.  EXAM: CT CHEST, ABDOMEN, AND PELVIS WITH CONTRAST  TECHNIQUE: Multidetector CT imaging of the chest, abdomen and pelvis was performed following the standard protocol during bolus administration of intravenous contrast.  CONTRAST:  157m OMNIPAQUE IOHEXOL 300 MG/ML  SOLN  COMPARISON:  Multiple exams, including 09/11/2014  FINDINGS: CT CHEST FINDINGS  Mediastinum/Nodes: Coronary, aortic arch, and branch vessel atherosclerotic vascular disease. There is some proximal luminal narrowing due to mural thrombus in the left common carotid artery.  Right hilar lymph node, 0.9 cm in short axis, formerly 1.0 cm.  Lungs/Pleura: As noted on prior, there is plugging of the lateral basal and posterior basal segmental bronchi extending over a relatively long segment, associated with the left infrahilar lesion in this vicinity which measures 1 cm in diameter on image 42 series 4 (formerly the same).  Associated mild atelectasis in the left lower lobe. No progression.  Musculoskeletal: Degenerative glenohumeral arthropathy, right greater than left. Upper thoracic degenerative disc disease.  CT ABDOMEN PELVIS FINDINGS  Hepatobiliary: Mild diffuse hepatic steatosis.  Cholecystectomy.  Pancreas: Unremarkable  Spleen: Unremarkable  Adrenals/Urinary Tract: Vascular calcifications in the right renal hilum.  Stomach/Bowel: Descending and sigmoid colon diverticulosis.  Vascular/Lymphatic: Aortoiliac atherosclerotic vascular disease. There is some very fairly striking calcific plaque at the origin of the left renal artery. 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  Reproductive: Minimally prominent prostate gland indents the bladder base.  Other: No supplemental non-categorized findings.  Musculoskeletal: Small bilateral inguinal hernias contain adipose tissue. Lower lumbar degenerative disc disease and spondylosis, with mild right foraminal stenosis at L4-5.  IMPRESSION: 1.  Stable 10 mm left infrahilar lesion associated with plugging extending in the lateral basal and posterior basal segmental bronchi of the left lower lobe. No change in this appearance compared to 01/18/15. 2. Considerable atherosclerosis. 3. Degenerative findings in the spine and in the glenohumeral joint. 4. Descending and sigmoid colon diverticulosis. 5. Mild diffuse hepatic steatosis.   Electronically Signed   By: Van Clines M.D.   On: 04/19/2015 09:43    ASSESSMENT AND PLAN: This is a very pleasant 64 years old white male with extensive stage small cell lung cancer currently undergoing systemic chemotherapy with cisplatin and etoposide status post 6 cycles and tolerated his treatment fairly well. This was followed by prophylactic cranial irradiation. The recent CT scan of the chest, abdomen and pelvis showed  stable disease with no evidence for disease progression. I discussed the scan results with the patient and his wife. I will see the patient back for follow-up visit in 3 months for reevaluation after repeating CT scan of the chest, abdomen pelvis for restaging of his disease. He was advised to call immediately if he has any concerning symptoms in the interval. The patient voices understanding of current disease status and treatment options and is in agreement with the current care plan.  All questions were answered. The patient knows to call the clinic with any problems, questions or concerns. We can certainly see the patient much sooner if necessary.  Disclaimer: This note was dictated with voice recognition software. Similar sounding words can inadvertently be transcribed and may not be corrected upon review.

## 2015-04-26 NOTE — Telephone Encounter (Signed)
Gave and printed appt sched and avs for pt ...gave barium

## 2015-05-21 ENCOUNTER — Ambulatory Visit (INDEPENDENT_AMBULATORY_CARE_PROVIDER_SITE_OTHER): Payer: 59 | Admitting: Emergency Medicine

## 2015-05-21 ENCOUNTER — Encounter: Payer: Self-pay | Admitting: Emergency Medicine

## 2015-05-21 VITALS — BP 118/66 | HR 78 | Ht 69.5 in | Wt 166.0 lb

## 2015-05-21 DIAGNOSIS — Z87891 Personal history of nicotine dependence: Secondary | ICD-10-CM

## 2015-05-21 DIAGNOSIS — D869 Sarcoidosis, unspecified: Secondary | ICD-10-CM

## 2015-05-21 DIAGNOSIS — J449 Chronic obstructive pulmonary disease, unspecified: Secondary | ICD-10-CM

## 2015-05-21 DIAGNOSIS — C3490 Malignant neoplasm of unspecified part of unspecified bronchus or lung: Secondary | ICD-10-CM | POA: Diagnosis not present

## 2015-05-21 LAB — PULMONARY FUNCTION TEST
DL/VA % pred: 63 %
DL/VA: 2.92 ml/min/mmHg/L
DLCO unc % pred: 56 %
DLCO unc: 17.8 ml/min/mmHg
FEF 25-75 Post: 1.39 L/sec
FEF 25-75 Pre: 1.15 L/sec
FEF2575-%Change-Post: 21 %
FEF2575-%Pred-Post: 51 %
FEF2575-%Pred-Pre: 42 %
FEV1-%Change-Post: 6 %
FEV1-%Pred-Post: 68 %
FEV1-%Pred-Pre: 64 %
FEV1-Post: 2.32 L
FEV1-Pre: 2.18 L
FEV1FVC-%Change-Post: 3 %
FEV1FVC-%Pred-Pre: 85 %
FEV6-%Change-Post: 4 %
FEV6-%Pred-Post: 80 %
FEV6-%Pred-Pre: 76 %
FEV6-Post: 3.44 L
FEV6-Pre: 3.31 L
FEV6FVC-%Change-Post: 1 %
FEV6FVC-%Pred-Post: 104 %
FEV6FVC-%Pred-Pre: 102 %
FVC-%Change-Post: 2 %
FVC-%Pred-Post: 76 %
FVC-%Pred-Pre: 74 %
FVC-Post: 3.48 L
FVC-Pre: 3.39 L
Post FEV1/FVC ratio: 67 %
Post FEV6/FVC ratio: 99 %
Pre FEV1/FVC ratio: 64 %
Pre FEV6/FVC Ratio: 98 %
RV % pred: 130 %
RV: 3.02 L
TLC % pred: 93 %
TLC: 6.44 L

## 2015-05-21 MED ORDER — ALBUTEROL SULFATE 108 (90 BASE) MCG/ACT IN AEPB: 2.0000 | INHALATION_SPRAY | RESPIRATORY_TRACT | Status: AC | PRN

## 2015-05-21 NOTE — Progress Notes (Signed)
PFT done today. 

## 2015-05-21 NOTE — Assessment & Plan Note (Signed)
Diagnosed based on his primary function testing from today. I would like to try him on long-acting bronchodilators to see if he benefits. He is about to start a supervised exercise program which I fully endorse. Also teach him to use pro-air respi-click prn. He'll follow-up in one to 2 months to review whether he's benefited

## 2015-05-21 NOTE — Addendum Note (Signed)
Addended by: Renelda Mom on: 05/21/2015 04:48 PM   Modules accepted: Orders

## 2015-05-21 NOTE — Patient Instructions (Signed)
Pulmonary function testing shows evidence for COPD. You  may benefit from an inhaled medication We will do a trial of Spiriva, 2 puffs once a day to see if you benefit We will also teach you to use albuterol 2 puffs as needed for shortness of breath Start your supervised exercise program as planned Follow with Dr [name] as planned.  Follow with Dr Lamonte Sakai in 2 months or sooner if you have any problems.

## 2015-05-21 NOTE — Assessment & Plan Note (Signed)
No evidence of active disease at this time although his obstructive lung disease could relate to his sarcoidosis as well as his tobacco history

## 2015-05-21 NOTE — Progress Notes (Signed)
Subjective:    Patient ID: Spencer Long, male    DOB: [birth year], 64 y.o.   MRN: [id number]  HPI  08/31/2014 routine office visit> Spencer Long is here to see me today to follow-up from his bronchoscopy which was performed by my partner last week. The biopsy from the lymph node as well as the left lower lobe mass demonstrated small cell lung cancer. He is supposed to see his new oncologist this week.  Today he primarily has questions about smoking cessation. He continues to smoke 1-1/2 packs of cigarettes daily and wants to quit. He had very severe side effects from Chantix and does not want to take that again. He has not taken nicotine replacement. He also has questions about whether or not the rash on his arm could be related to his malignancy.  Follow up visit s/p FOB 11/02/14 -- follow-up visit following recent bronchoscopy, nodal biopsy and left lower lobe biopsy that showed small cell lung cancer. He has received chemo x3, CT scan 12/28 shows that he is responding.  He has done well post-procedure. HE HASN'T SMOKED SINCE 09/14/14!! His cough is better, has done well through the chemo.   ROV 03/25/15 -- follow-up visit for history of tobacco abuse, sarcoidosis and small cell lung cancer. He is in remission.  Planning to have repeat scan in June. He describes fatigue with his current XRT, some exertional SOB. No cough, no wheeze.  Notes from Oncology and rad onc reviewed. No PFT on record. I reviewed his spirometry from 10/15. I reviewed his CT scan from 01/2015 myself/.   ROV 05/21/15 -- follow-up visit for history of sarcoidosis and small cell lung cancer which has been treated with chemotherapy and radiation therapy. He underwent CT scan of the chest on 04/19/15 that I reviewed myself. This shows stable left infrahilar disease without any evidence of progression. He is to continue on observation under the supervision of Dr. [name]. He underwent pulmonary function testing today that I reviewed  myself, shows moderately severe airflow limitation without a bronchodilator response. His lung volumes showed evidence for hyperinflation and his diffusion capacity was decreased. He has been doing well, denies SOB. He is about to start a supervised exercise program, with a trainer.     Past Medical History  Diagnosis Date  . Hypertension     under control, has been on med. x 15-20 yrs.  [name] GERD (gastroesophageal reflux disease)   . Infection of elbow     right-no infection at present , but has skin lesions present  . Sarcoidosis     "tends to have skin flareups" and presently experiencing now"feet, legs and arms"  . Chronic hyponatremia     Dr. Buddy Duty follows  . Radiation 02/11/15-02/25/15    brain 25 Gy  . Lung cancer      Review of Systems     Objective:   Physical Exam  Filed Vitals:   05/21/15 1615  BP: 118/66  Pulse: 78  Height: 5' 9.5" (1.765 m)  Weight: 166 lb (75.297 kg)  SpO2: 97%   Gen: well appearing, no acute distress HEENT: NCAT, EOMi, OP clear,  PULM: no wheeze CV: RRR, no mgr, no JVD AB: BS+, soft, nontender Ext: warm, no edema, no clubbing, no cyanosis Derm: no rash or skin breakdown Neuro: A&Ox4, MAEW     Assessment & Plan:   COPD (chronic obstructive pulmonary disease) Diagnosed based on his primary function testing from today. I would like to try him on  long-acting bronchodilators to see if he benefits. He is about to start a supervised exercise program which I fully endorse. Also teach him to use pro-air respi-click prn. He'll follow-up in one to 2 months to review whether he's benefited  Small cell lung cancer Due for follow-up with Dr. [name] in 3 months with imaging  Sarcoidosis No evidence of active disease at this time although his obstructive lung disease could relate to his sarcoidosis as well as his tobacco history    Updated Medication List Outpatient Encounter Prescriptions as of 05/21/2015  Medication Sig  . amLODipine (NORVASC)  10 MG tablet Take 10 mg by mouth every morning.   [name] aspirin EC 81 MG tablet Take 81 mg by mouth every morning.   [name] atenolol (TENORMIN) 25 MG tablet Take 25 mg by mouth 2 (two) times daily.    . Azilsartan Medoxomil (EDARBI) 80 MG TABS Take 1 tablet by mouth every morning.  [name] buPROPion (WELLBUTRIN SR) 150 MG 12 hr tablet TAKE ONE TABLET BY MOUTH TWICE DAILY.  [name] diclofenac (VOLTAREN) 75 MG EC tablet Take 75 mg by mouth 2 (two) times daily.  [name] dicyclomine (BENTYL) 20 MG tablet Take 20 mg by mouth every 4 (four) hours as needed for spasms.   . folic acid (FOLVITE) 1 MG tablet Take 1 mg by mouth 2 (two) times daily.  [name] loperamide (IMODIUM) 2 MG capsule Take 2 mg by mouth as needed for diarrhea or loose stools.  . Multiple Vitamins-Minerals (MULTIVITAMIN ADULTS 50+ PO) Take 1 tablet by mouth every morning.   . mupirocin cream (BACTROBAN) 2 % Apply 1 application topically 2 (two) times daily.  . nicotine (NICODERM CQ - DOSED IN MG/24 HOURS) 14 mg/24hr patch Place 1 patch (14 mg total) onto the skin daily.  [name] omeprazole (PRILOSEC) 40 MG capsule Take 40 mg by mouth every morning.  . Probiotic Product (PROBIOTIC DAILY PO) Take 1 tablet by mouth every morning.   . sodium chloride 1 G tablet Take 1 g by mouth 3 (three) times daily.  . white petrolatum (VASELINE) GEL Apply 1 application topically daily as needed for lip care or dry skin.   No facility-administered encounter medications on file as of 05/21/2015.

## 2015-05-21 NOTE — Assessment & Plan Note (Signed)
Due for follow-up with Dr. [name] in 3 months with imaging

## 2015-07-14 ENCOUNTER — Ambulatory Visit: Payer: 59 | Admitting: Radiation Oncology

## 2015-07-22 ENCOUNTER — Encounter: Payer: Self-pay | Admitting: Radiation Oncology

## 2015-07-22 ENCOUNTER — Ambulatory Visit
Admission: RE | Admit: 2015-07-22 | Discharge: 2015-07-22 | Disposition: A | Discharge status: Home / Self Care | Payer: 59 | Source: Ambulatory Visit | Attending: Radiation Oncology | Admitting: Radiation Oncology

## 2015-07-22 ENCOUNTER — Ambulatory Visit (INDEPENDENT_AMBULATORY_CARE_PROVIDER_SITE_OTHER): Payer: 59 | Admitting: Emergency Medicine

## 2015-07-22 ENCOUNTER — Encounter: Payer: Self-pay | Admitting: Emergency Medicine

## 2015-07-22 VITALS — BP 102/60 | HR 66 | Ht 69.0 in | Wt 164.0 lb

## 2015-07-22 VITALS — BP 142/102 | HR 69 | Temp 97.6°F | Ht 69.0 in | Wt 164.6 lb

## 2015-07-22 DIAGNOSIS — C3490 Malignant neoplasm of unspecified part of unspecified bronchus or lung: Secondary | ICD-10-CM | POA: Diagnosis not present

## 2015-07-22 DIAGNOSIS — D869 Sarcoidosis, unspecified: Secondary | ICD-10-CM | POA: Diagnosis not present

## 2015-07-22 DIAGNOSIS — Z23 Encounter for immunization: Secondary | ICD-10-CM

## 2015-07-22 DIAGNOSIS — J449 Chronic obstructive pulmonary disease, unspecified: Secondary | ICD-10-CM

## 2015-07-22 DIAGNOSIS — Z87891 Personal history of nicotine dependence: Secondary | ICD-10-CM

## 2015-07-22 MED ORDER — TIOTROPIUM BROMIDE MONOHYDRATE 1.25 MCG/ACT IN AERS: 2.0000 | INHALATION_SPRAY | Freq: Every day | RESPIRATORY_TRACT | Status: DC

## 2015-07-22 MED ORDER — NICOTINE 7 MG/24HR TD PT24: 7.0000 mg | MEDICATED_PATCH | Freq: Every day | TRANSDERMAL | Status: DC

## 2015-07-22 NOTE — Assessment & Plan Note (Signed)
Continue Spiriva as above

## 2015-07-22 NOTE — Assessment & Plan Note (Signed)
Planning a follow-up CT scan of the chest next week and then reviewed with Dr. [name]. He sees Dr. Sondra Come today

## 2015-07-22 NOTE — Addendum Note (Signed)
Addended by: Osa Craver on: 07/22/2015 10:50 AM   Modules accepted: Orders

## 2015-07-22 NOTE — Assessment & Plan Note (Signed)
Change nicotine patch dose to 7 mg

## 2015-07-22 NOTE — Progress Notes (Signed)
Subjective:    Patient ID: Spencer Long, male    DOB: 26-Dec-1950, 64 y.o.   MRN: [id number]  HPI  08/31/2014 routine office visit> Spencer Long is here to see me today to follow-up from his bronchoscopy which was performed by my partner last week. The biopsy from the lymph node as well as the left lower lobe mass demonstrated small cell lung cancer. He is supposed to see his new oncologist this week.  Today he primarily has questions about smoking cessation. He continues to smoke 1-1/2 packs of cigarettes daily and wants to quit. He had very severe side effects from Chantix and does not want to take that again. He has not taken nicotine replacement. He also has questions about whether or not the rash on his arm could be related to his malignancy.  Follow up visit s/p FOB 11/02/14 -- follow-up visit following recent bronchoscopy, nodal biopsy and left lower lobe biopsy that showed small cell lung cancer. He has received chemo x3, CT scan 12/28 shows that he is responding.  He has done well post-procedure. HE HASN'T SMOKED SINCE 09/14/14!! His cough is better, has done well through the chemo.   ROV 03/25/15 -- follow-up visit for history of tobacco abuse, sarcoidosis and small cell lung cancer. He is in remission.  Planning to have repeat scan in June. He describes fatigue with his current XRT, some exertional SOB. No cough, no wheeze.  Notes from Oncology and rad onc reviewed. No PFT on record. I reviewed his spirometry from 10/15. I reviewed his CT scan from 01/2015 myself/.   ROV 05/21/15 -- follow-up visit for history of sarcoidosis and small cell lung cancer which has been treated with chemotherapy and radiation therapy. He underwent CT scan of the chest on 04/19/15 that I reviewed myself. This shows stable left infrahilar disease without any evidence of progression. He is to continue on observation under the supervision of Dr. [name]. He underwent pulmonary function testing today that I reviewed  myself, shows moderately severe airflow limitation without a bronchodilator response. His lung volumes showed evidence for hyperinflation and his diffusion capacity was decreased. He has been doing well, denies SOB. He is about to start a supervised exercise program, with a trainer.   ROV 07/22/15 -- Follow-up visit for small cell lung cancer, sarcoidosis, former tobacco use. At his last visit we did trial of initiation of Spiriva. Scheduling CT chest next week and then following with Dr [name]. He is feeling well, has been working with a Clinical research associate. His exercise tolerance is improved. He has not taken the spiriva reliably, started it recently and has gotten through about 3 weeks. No mucous, no cough, no wheeze.    Past Medical History  Diagnosis Date  . Hypertension     under control, has been on med. x 15-20 yrs.  [name] GERD (gastroesophageal reflux disease)   . Infection of elbow     right-no infection at present , but has skin lesions present  . Sarcoidosis     "tends to have skin flareups" and presently experiencing now"feet, legs and arms"  . Chronic hyponatremia     Dr. Buddy Duty follows  . Radiation 02/11/15-02/25/15    brain 25 Gy  . Lung cancer      Review of Systems     Objective:   Physical Exam  Filed Vitals:   07/22/15 1013  BP: 102/60  Pulse: 66  Height: '5\' 9"'$  (1.753 m)  Weight: 164 lb (74.39 kg)  SpO2:  95%   Gen: well appearing, no acute distress HEENT: NCAT, EOMi, OP clear,  PULM: no wheeze CV: RRR, no mgr, no JVD AB: BS+, soft, nontender Ext: warm, no edema, no clubbing, no cyanosis Derm: no rash or skin breakdown Neuro: A&Ox4, MAEW     Assessment & Plan:   Sarcoidosis Has been stable. Difficult to say whether history improved with the Spiriva as he has also been working on his physical conditioning with a Physiological scientist. Like to continue the Spiriva daily for another 3 months and make a decision about whether he is truly benefiting.   History of tobacco  abuse Change nicotine patch dose to 7 mg  Small cell lung cancer Planning a follow-up CT scan of the chest next week and then reviewed with Dr. [name]. He sees Dr. Sondra Come today  COPD (chronic obstructive pulmonary disease) Continue Spiriva as above    Updated Medication List Outpatient Encounter Prescriptions as of 07/22/2015  Medication Sig  . Albuterol Sulfate (PROAIR RESPICLICK) 536 (90 BASE) MCG/ACT AEPB Inhale 2 puffs into the lungs every 4 (four) hours as needed.  [name] amLODipine (NORVASC) 10 MG tablet Take 10 mg by mouth every morning.   [name] aspirin EC 81 MG tablet Take 81 mg by mouth every morning.   [name] atenolol (TENORMIN) 25 MG tablet Take 25 mg by mouth 2 (two) times daily.    . Azilsartan Medoxomil (EDARBI) 80 MG TABS Take 1 tablet by mouth every morning.  [name] buPROPion (WELLBUTRIN SR) 150 MG 12 hr tablet TAKE ONE TABLET BY MOUTH TWICE DAILY.  [name] diclofenac (VOLTAREN) 75 MG EC tablet Take 75 mg by mouth 2 (two) times daily.  [name] dicyclomine (BENTYL) 20 MG tablet Take 20 mg by mouth every 4 (four) hours as needed for spasms.   . folic acid (FOLVITE) 1 MG tablet Take 1 mg by mouth 2 (two) times daily.  [name] loperamide (IMODIUM) 2 MG capsule Take 2 mg by mouth as needed for diarrhea or loose stools.  . Multiple Vitamins-Minerals (MULTIVITAMIN ADULTS 50+ PO) Take 1 tablet by mouth every morning.   . mupirocin cream (BACTROBAN) 2 % Apply 1 application topically 2 (two) times daily.  . nicotine (NICODERM CQ - DOSED IN MG/24 HOURS) 14 mg/24hr patch Place 1 patch (14 mg total) onto the skin daily.  [name] omeprazole (PRILOSEC) 40 MG capsule Take 40 mg by mouth every morning.  . Probiotic Product (PROBIOTIC DAILY PO) Take 1 tablet by mouth every morning.   . sodium chloride 1 G tablet Take 1 g by mouth 3 (three) times daily.  . Tiotropium Bromide Monohydrate (SPIRIVA RESPIMAT) 1.25 MCG/ACT AERS Inhale 2 puffs into the lungs daily.  . white petrolatum (VASELINE) GEL Apply 1 application topically daily  as needed for lip care or dry skin.   No facility-administered encounter medications on file as of 07/22/2015.

## 2015-07-22 NOTE — Progress Notes (Signed)
Follow up appointment for radiation treatment to lung, completed in October 2015. He reports no shortness of breath, his appetite is good. He reports he is exercising twice a week with a Physiological scientist. He saw Dr. Lamonte Sakai today and is scheduled for a CT next week.12:12 PM BP 142/102 mmHg  Pulse 69  Temp(Src) 97.6 F (36.4 C)  Ht '5\' 9"'$  (1.753 m)  Wt 164 lb 9.6 oz (74.662 kg)  BMI 24.30 kg/m2  SpO2 97%  Wt Readings from Last 3 Encounters:  07/22/15 164 lb 9.6 oz (74.662 kg)  07/22/15 164 lb (74.39 kg)  05/21/15 166 lb (75.297 kg)

## 2015-07-22 NOTE — Progress Notes (Signed)
Radiation Oncology         [phone] ________________________________  Name: Spencer Long MRN: [id number]  Date: 07/22/2015  DOB: 26-Aug-1951  Follow-Up Visit Note  CC: [name], MD  Curt Bears, MD    ICD-9-CM ICD-10-CM   1. Small cell lung cancer, unspecified laterality 162.9 C34.90     Diagnosis: Extensive Stage (T2, N1, M1b) Small cell lung Cancer diagnosed in October 2015.  Interval Since Last Radiation:  5  months [02/11/2015-02/25/2015]  The site and dose used includes  Brain, 25 Gy in 10 fxs. The beams and energy used includes Laterals, 6 MV photons. Prophylactic treatment  Narrative:  The patient returns today for routine follow-up appointment with radiation oncology. The patient projected a health mental status and was accompanied by his wife. Patient denies any headaches dizziness or blurred vision. Patient and his wife report no problems with memory. Chest CT scan shows stable disease without any progression  Patient reports a sore area in the floor of his mouth. No bleeding from this area.                            ALLERGIES:  is allergic to varenicline; isothiazolinone chloride; oxycodone; and quaternium-15.  Meds: Current Outpatient Prescriptions  Medication Sig Dispense Refill  . Albuterol Sulfate (PROAIR RESPICLICK) 016 (90 BASE) MCG/ACT AEPB Inhale 2 puffs into the lungs every 4 (four) hours as needed. 1 each 2  . amLODipine (NORVASC) 10 MG tablet Take 10 mg by mouth every morning.     [name] aspirin EC 81 MG tablet Take 81 mg by mouth every morning.     [name] atenolol (TENORMIN) 25 MG tablet Take 25 mg by mouth 2 (two) times daily.      . Azilsartan Medoxomil (EDARBI) 80 MG TABS Take 1 tablet by mouth every morning.    [name] buPROPion (WELLBUTRIN SR) 150 MG 12 hr tablet TAKE ONE TABLET BY MOUTH TWICE DAILY. 60 tablet 5  . diclofenac (VOLTAREN) 75 MG EC tablet Take 75 mg by mouth 2 (two) times daily.    [name] dicyclomine (BENTYL) 20 MG tablet Take 20  mg by mouth every 4 (four) hours as needed for spasms.     . folic acid (FOLVITE) 1 MG tablet Take 1 mg by mouth 2 (two) times daily.    [name] loperamide (IMODIUM) 2 MG capsule Take 2 mg by mouth as needed for diarrhea or loose stools.    . Multiple Vitamins-Minerals (MULTIVITAMIN ADULTS 50+ PO) Take 1 tablet by mouth every morning.     . mupirocin cream (BACTROBAN) 2 % Apply 1 application topically 2 (two) times daily.    . nicotine (NICODERM CQ - DOSED IN MG/24 HOURS) 14 mg/24hr patch Place 1 patch (14 mg total) onto the skin daily. 28 patch 2  . nicotine (NICODERM CQ - DOSED IN MG/24 HR) 7 mg/24hr patch Place 1 patch (7 mg total) onto the skin daily. 28 patch 0  . omeprazole (PRILOSEC) 40 MG capsule Take 40 mg by mouth every morning.    . Probiotic Product (PROBIOTIC DAILY PO) Take 1 tablet by mouth every morning.     . sodium chloride 1 G tablet Take 1 g by mouth 3 (three) times daily.    . Tiotropium Bromide Monohydrate (SPIRIVA RESPIMAT) 1.25 MCG/ACT AERS Inhale 2 puffs into the lungs daily.    . Tiotropium Bromide Monohydrate (SPIRIVA RESPIMAT) 1.25 MCG/ACT AERS Inhale 2 puffs into the lungs  daily. 1 Inhaler 12  . white petrolatum (VASELINE) GEL Apply 1 application topically daily as needed for lip care or dry skin.     No current facility-administered medications for this encounter.    Physical Findings: The patient is in no acute distress. Patient is alert and oriented x3.  height is '5\' 9"'$  (1.753 m) and weight is 164 lb 9.6 oz (74.662 kg). His temperature is 97.6 F (36.4 C). His blood pressure is 142/102 and his pulse is 69. His oxygen saturation is 97%.  There is no significant changes to the status of the paients overall health to be noted at this time. Lungs are clear to auscultation bilaterally. Heart has regular rate and rhythm. No palpable cervical, supraclavicular, or axillary adenopathy.  The floor  mouth area shows a 5 x 7 mm lesion somewhat suspicious for possible malignancy, ,  midline    Lab Findings: Lab Results  Component Value Date   WBC 6.1 04/12/2015   HGB 12.8* 04/12/2015   HCT 36.9* 04/12/2015   MCV 93.7 04/12/2015   PLT 305 04/12/2015    Radiographic Findings: No results found.  Impression:  Patient tolerated his prophylactic cranial irradiation well without any obvious neurologic sequela. The patient understands that he can access his appointments and medical records via [place].   Plan:   Patient has seen an oral surgeon in the past, Dr. Buelah Manis. The patient's wife will schedule a potential appointment concerning his floor of mouth lesion. If the patient cannot be seen in oral surgery then he'll be set up for ENT consultation. This document serves as a record of services personally performed by Gery Pray, MD. It was created on his behalf by Lenn Cal, a trained medical scribe. The creation of this record is based on the scribe's personal observations and the provider's statements to them. This document has been checked and approved by the attending provider.    -----------------------------------  Blair Promise, PhD, MD

## 2015-07-22 NOTE — Assessment & Plan Note (Signed)
Has been stable. Difficult to say whether history improved with the Spiriva as he has also been working on his physical conditioning with a Physiological scientist. Like to continue the Spiriva daily for another 3 months and make a decision about whether he is truly benefiting.

## 2015-07-22 NOTE — Patient Instructions (Signed)
Please continue Spiriva daily. Keep track of whether this helps your symptoms and we will reassess in 3 months We will decrease your nicotine patch to 7 mg Follow with Dr [name] and Dr Sondra Come as planned Follow with Dr Lamonte Sakai in 3 months or sooner if you have any problems.

## 2015-07-23 ENCOUNTER — Telehealth: Payer: Self-pay | Admitting: Oncology

## 2015-07-23 NOTE — Telephone Encounter (Signed)
Called Spencer Long regarding his question about skin irritation from radiation.  Advised him to keep using biafine on the affected areas until he runs out.  Then he can use a vitamin E cream.  Advised him that the skin irritation will clear up over the next few months.

## 2015-07-26 ENCOUNTER — Other Ambulatory Visit: Payer: 59

## 2015-07-29 ENCOUNTER — Telehealth: Payer: Self-pay | Admitting: *Deleted

## 2015-07-29 ENCOUNTER — Encounter (HOSPITAL_COMMUNITY): Payer: Self-pay

## 2015-07-29 ENCOUNTER — Other Ambulatory Visit (HOSPITAL_BASED_OUTPATIENT_CLINIC_OR_DEPARTMENT_OTHER): Payer: 59

## 2015-07-29 ENCOUNTER — Ambulatory Visit (HOSPITAL_COMMUNITY)
Admission: RE | Admit: 2015-07-29 | Discharge: 2015-07-29 | Disposition: A | Discharge status: Home / Self Care | Payer: 59 | Source: Ambulatory Visit | Attending: Internal Medicine | Admitting: Internal Medicine

## 2015-07-29 DIAGNOSIS — C7A1 Malignant poorly differentiated neuroendocrine tumors: Secondary | ICD-10-CM

## 2015-07-29 DIAGNOSIS — C3490 Malignant neoplasm of unspecified part of unspecified bronchus or lung: Secondary | ICD-10-CM

## 2015-07-29 DIAGNOSIS — R918 Other nonspecific abnormal finding of lung field: Secondary | ICD-10-CM | POA: Insufficient documentation

## 2015-07-29 DIAGNOSIS — C3492 Malignant neoplasm of unspecified part of left bronchus or lung: Secondary | ICD-10-CM

## 2015-07-29 LAB — COMPREHENSIVE METABOLIC PANEL (CC13)
ALT: 18 U/L (ref 0–55)
AST: 15 U/L (ref 5–34)
Albumin: 4.2 g/dL (ref 3.5–5.0)
Alkaline Phosphatase: 80 U/L (ref 40–150)
Anion Gap: 9 mEq/L (ref 3–11)
BUN: 13.5 mg/dL (ref 7.0–26.0)
CHLORIDE: 92 meq/L — AB (ref 98–109)
CO2: 23 mEq/L (ref 22–29)
Calcium: 9.5 mg/dL (ref 8.4–10.4)
Creatinine: 0.8 mg/dL (ref 0.7–1.3)
EGFR: >90 mL/min/{1.73_m2} (ref >90)
GLUCOSE: 96 mg/dL (ref 70–140)
Potassium: 5.6 mEq/L — ABNORMAL HIGH (ref 3.5–5.1)
Sodium: 123 mEq/L — ABNORMAL LOW (ref 136–145)
Total Bilirubin: 0.78 mg/dL (ref 0.20–1.20)
Total Protein: 6.6 g/dL (ref 6.4–8.3)

## 2015-07-29 LAB — MAGNESIUM (CC13): Magnesium: 2 mg/dl (ref 1.5–2.5)

## 2015-07-29 LAB — CBC WITH DIFFERENTIAL/PLATELET
BASO%: 0.9 % (ref 0.0–2.0)
BASOS ABS: 0 10*3/uL (ref 0.0–0.1)
EOS%: 3.3 % (ref 0.0–7.0)
Eosinophils Absolute: 0.2 10*3/uL (ref 0.0–0.5)
HEMATOCRIT: 35.8 % — AB (ref 38.4–49.9)
HGB: 12.7 g/dL — ABNORMAL LOW (ref 13.0–17.1)
LYMPH#: 0.5 10*3/uL — AB (ref 0.9–3.3)
LYMPH%: 9.1 % — ABNORMAL LOW (ref 14.0–49.0)
MCH: 31.7 pg (ref 27.2–33.4)
MCHC: 35.4 g/dL (ref 32.0–36.0)
MCV: 89.4 fL (ref 79.3–98.0)
MONO#: 0.8 10*3/uL (ref 0.1–0.9)
MONO%: 14.6 % — ABNORMAL HIGH (ref 0.0–14.0)
NEUT#: 3.8 10*3/uL (ref 1.5–6.5)
NEUT%: 72.1 % (ref 39.0–75.0)
PLATELETS: 314 10*3/uL (ref 140–400)
RBC: 4 10*6/uL — ABNORMAL LOW (ref 4.20–5.82)
RDW: 12.2 % (ref 11.0–14.6)
WBC: 5.2 10*3/uL (ref 4.0–10.3)

## 2015-07-29 MED ORDER — IOHEXOL 300 MG/ML  SOLN
100.0000 mL | Freq: Once | INTRAMUSCULAR | Status: AC | PRN
  Administered 2015-07-29: 100 mL via INTRAVENOUS

## 2015-07-29 NOTE — Telephone Encounter (Signed)
Spencer Long called report of CT chest and will fax report to [phone]. IMPRESSION: Interval increase in size of left infrahilar mass. There is resultant bandlike consolidation within the left lower lobe which may represent atelectasis or extension of disease. Interval increase in size of small left pleural   Next scheduled F/U on 08-02-2015

## 2015-08-02 ENCOUNTER — Telehealth: Payer: Self-pay | Admitting: Internal Medicine

## 2015-08-02 ENCOUNTER — Ambulatory Visit (HOSPITAL_BASED_OUTPATIENT_CLINIC_OR_DEPARTMENT_OTHER): Payer: Commercial Managed Care - HMO | Admitting: Internal Medicine

## 2015-08-02 ENCOUNTER — Encounter: Payer: Self-pay | Admitting: Internal Medicine

## 2015-08-02 VITALS — BP 147/72 | HR 71 | Temp 97.8°F | Resp 18 | Ht 69.0 in | Wt 164.7 lb

## 2015-08-02 DIAGNOSIS — C7A1 Malignant poorly differentiated neuroendocrine tumors: Secondary | ICD-10-CM

## 2015-08-02 DIAGNOSIS — R531 Weakness: Secondary | ICD-10-CM

## 2015-08-02 DIAGNOSIS — R410 Disorientation, unspecified: Secondary | ICD-10-CM | POA: Diagnosis not present

## 2015-08-02 DIAGNOSIS — Z66 Do not resuscitate: Secondary | ICD-10-CM

## 2015-08-02 DIAGNOSIS — C3492 Malignant neoplasm of unspecified part of left bronchus or lung: Secondary | ICD-10-CM

## 2015-08-02 HISTORY — DX: Do not resuscitate: Z66

## 2015-08-02 NOTE — Telephone Encounter (Signed)
per p[facility] to sch pt appt-gave avs-adv [facility] will call to sch PET./MRI

## 2015-08-02 NOTE — Progress Notes (Signed)
Welcome [phone]   Fax:([phone]) [place], [place] [address]  DIAGNOSIS: Extensive Stage (T2, N1, M1b) Small cell lung Cancer diagnosed in October 2015.  PRIOR THERAPY:  1) Systemic chemotherapy with cisplatin 60 MG/M2 on day 1 and etoposide 120 MG/M2 on days 1, 2 and 3 with Neulasta support on day 4, status post 6 cycles. 2) prophylactic cranial irradiation under the care of Dr. Sondra Come  CURRENT THERAPY: Observation.  INTERVAL HISTORY: Spencer Long 64 y.o. male returns to the clinic today for follow-up visit accompanied by his wife. The patient has been complaining of increasing fatigue and weakness especially in the lower extremities. He does not feel well in general with more confusion. He goes to the gym several days the week and doing physical therapy. He denied having any significant chest pain, shortness of breath, cough or hemoptysis. The patient denied having any nausea or vomiting, no fever or chills. He had repeat CT scan of the chest, abdomen and pelvis performed recently and he is here for evaluation and discussion of his scan results.  MEDICAL HISTORY: Past Medical History  Diagnosis Date  . Hypertension     under control, has been on med. x 15-20 yrs.  [name] GERD (gastroesophageal reflux disease)   . Infection of elbow     right-no infection at present , but has skin lesions present  . Sarcoidosis     "tends to have skin flareups" and presently experiencing now"feet, legs and arms"  . Chronic hyponatremia     Dr. Buddy Duty follows  . Radiation 02/11/15-02/25/15    brain 25 Gy  . Lung cancer     ALLERGIES:  is allergic to varenicline; isothiazolinone chloride; oxycodone; and quaternium-15.  MEDICATIONS:  Current Outpatient Prescriptions  Medication Sig Dispense Refill  . Albuterol Sulfate (PROAIR RESPICLICK) 937 (90 BASE) MCG/ACT AEPB Inhale 2 puffs into  the lungs every 4 (four) hours as needed. 1 each 2  . amLODipine (NORVASC) 10 MG tablet Take 10 mg by mouth every morning.     [name] aspirin EC 81 MG tablet Take 81 mg by mouth every morning.     [name] atenolol (TENORMIN) 25 MG tablet Take 25 mg by mouth 2 (two) times daily.      . Azilsartan Medoxomil (EDARBI) 80 MG TABS Take 1 tablet by mouth every morning.    [name] buPROPion (WELLBUTRIN SR) 150 MG 12 hr tablet TAKE ONE TABLET BY MOUTH TWICE DAILY. 60 tablet 5  . diclofenac (VOLTAREN) 75 MG EC tablet Take 75 mg by mouth 2 (two) times daily.    [name] dicyclomine (BENTYL) 20 MG tablet Take 20 mg by mouth every 4 (four) hours as needed for spasms.     . folic acid (FOLVITE) 1 MG tablet Take 1 mg by mouth 2 (two) times daily.    [name] loperamide (IMODIUM) 2 MG capsule Take 2 mg by mouth as needed for diarrhea or loose stools.    . Multiple Vitamins-Minerals (MULTIVITAMIN ADULTS 50+ PO) Take 1 tablet by mouth every morning.     . mupirocin cream (BACTROBAN) 2 % Apply 1 application topically 2 (two) times daily.    . nicotine (NICODERM CQ - DOSED IN MG/24 HOURS) 14 mg/24hr patch Place 1 patch (14 mg total) onto the skin daily. 28 patch 2  . nicotine (NICODERM CQ - DOSED IN MG/24 HR) 7 mg/24hr patch Place 1  patch (7 mg total) onto the skin daily. 28 patch 0  . omeprazole (PRILOSEC) 40 MG capsule Take 40 mg by mouth every morning.    . Probiotic Product (PROBIOTIC DAILY PO) Take 1 tablet by mouth every morning.     . sodium chloride 1 G tablet Take 1 g by mouth 3 (three) times daily.    . Tiotropium Bromide Monohydrate (SPIRIVA RESPIMAT) 1.25 MCG/ACT AERS Inhale 2 puffs into the lungs daily.    . Tiotropium Bromide Monohydrate (SPIRIVA RESPIMAT) 1.25 MCG/ACT AERS Inhale 2 puffs into the lungs daily. 1 Inhaler 12  . white petrolatum (VASELINE) GEL Apply 1 application topically daily as needed for lip care or dry skin.     No current facility-administered medications for this visit.    SURGICAL HISTORY:  Past Surgical  History  Procedure Laterality Date  . Cholecystectomy  05/2006  . Ulnar nerve repair  01/2008    right; decompression  . Carpal tunnel release  01/2008    right  . Eye surgery  12/2008    correction ectropion, release band, wedge exc., shortening  . I&d extremity  09/12/2011    Procedure: IRRIGATION AND DEBRIDEMENT EXTREMITY;  Surgeon: Tennis Must;  Location: [facility];  Service: Orthopedics;  Laterality: Right;  I&d RIGHT OLECRANON BURSSA   . Tonsillectomy    . Endobronchial ultrasound Bilateral 08/24/2014    Procedure: ENDOBRONCHIAL ULTRASOUND;  Surgeon: Collene Gobble, MD;  Location: [facility];  Service: Cardiopulmonary;  Laterality: Bilateral;    REVIEW OF SYSTEMS:  Constitutional: positive for fatigue Eyes: negative Ears, nose, mouth, throat, and face: negative Respiratory: negative Cardiovascular: negative Gastrointestinal: negative Genitourinary:negative Integument/breast: negative Hematologic/lymphatic: negative Musculoskeletal:positive for muscle weakness Neurological: positive for coordination problems and weakness Behavioral/Psych: negative Endocrine: negative Allergic/Immunologic: negative   PHYSICAL EXAMINATION: General appearance: alert, cooperative, fatigued and no distress Head: Normocephalic, without obvious abnormality, atraumatic Neck: no adenopathy, no JVD, supple, symmetrical, trachea midline and thyroid not enlarged, symmetric, no tenderness/mass/nodules Lymph nodes: Cervical, supraclavicular, and axillary nodes normal. Resp: clear to auscultation bilaterally Back: symmetric, no curvature. ROM normal. No CVA tenderness. Cardio: regular rate and rhythm, S1, S2 normal, no murmur, click, rub or gallop GI: soft, non-tender; bowel sounds normal; no masses,  no organomegaly Extremities: extremities normal, atraumatic, no cyanosis or edema Neurologic: Alert and oriented X 3, normal strength and tone. Normal symmetric reflexes. Normal  coordination and gait  ECOG PERFORMANCE STATUS: 1 - Symptomatic but completely ambulatory  There were no vitals taken for this visit.  LABORATORY DATA: Lab Results  Component Value Date   WBC 5.2 07/29/2015   HGB 12.7* 07/29/2015   HCT 35.8* 07/29/2015   MCV 89.4 07/29/2015   PLT 314 07/29/2015      Chemistry      Component Value Date/Time   NA 123* 07/29/2015 0827   NA 125* 09/12/2011 1302   K 5.6* 07/29/2015 0827   K 5.5* 09/12/2011 1302   CL 95* 09/12/2011 1302   CO2 23 07/29/2015 0827   CO2 26 12/25/2008 1425   BUN 13.5 07/29/2015 0827   BUN 19 09/12/2011 1302   CREATININE 0.8 07/29/2015 0827   CREATININE 1.00 09/12/2011 1302      Component Value Date/Time   CALCIUM 9.5 07/29/2015 0827   CALCIUM 9.1 12/25/2008 1425   ALKPHOS 80 07/29/2015 0827   AST 15 07/29/2015 0827   ALT 18 07/29/2015 0827   BILITOT 0.78 07/29/2015 0827       RADIOGRAPHIC STUDIES: Ct  Chest W Contrast  07/29/2015   CLINICAL DATA:  Patient with history of small cell lung carcinoma diagnosed 08/18/2014. Status post chemotherapy.  EXAM: CT CHEST, ABDOMEN, AND PELVIS WITH CONTRAST  TECHNIQUE: Multidetector CT imaging of the chest, abdomen and pelvis was performed following the standard protocol during bolus administration of intravenous contrast.  CONTRAST:  160m OMNIPAQUE IOHEXOL 300 MG/ML  SOLN  COMPARISON:  CT CAP 04/19/2015  FINDINGS: CT CHEST FINDINGS  Mediastinum/Lymph Nodes: Interval increase in size of left infrahilar mass measuring 2.0 x 1.8 cm (image 37; series 2), previously 1.2 x 0.9 cm. Interval increase in size of right hilar lymph node measuring 1.1 cm (image 31; series 2), previously 0.9 cm. The heart is normal in size. Extensive coronary arterial vascular calcifications. Visualized thyroid is unremarkable. No enlarged axillary lymphadenopathy.  Lungs/Pleura: Left infrahilar mass exerts mass effect on the left lower lobe bronchi resulting in bandlike consolidation within the left lower  lobe which may represent atelectasis or extension of disease. Interval development of multiple small areas of subpleural ground-glass consolidation particularly within the left lower lobe measuring up to 1.7 cm (image 31; series 5). Large area of ground-glass attenuation within the anterior aspect of the left upper lobe (image 20 10-2020). Interval increase in size of small left pleural effusion. No pneumothorax.  CT ABDOMEN PELVIS FINDINGS  Hepatobiliary: Interval development of 2 low-attenuation lesions within the hepatic dome measuring 1.2 cm and 0.5 cm (image 52; series 2). No additional focal hepatic lesions are identified. The patient is status post cholecystectomy. There is no intrahepatic or extrahepatic biliary ductal dilatation.  Pancreas: Unremarkable  Spleen: Unremarkable  Adrenals/Urinary Tract: The adrenal glands are normal. The kidneys enhance symmetrically with contrast. No hydronephrosis. Urinary bladder is unremarkable.  Stomach/Bowel: Sigmoid colonic diverticulosis. No CT evidence for acute diverticulitis. The appendix is normal. Small hiatal hernia. Morphology of the stomach is normal. No free fluid or free intraperitoneal air.  Vascular/Lymphatic: Tortuous yet normal caliber abdominal aorta with peripheral calcified atherosclerotic plaque. No retroperitoneal lymphadenopathy.  Other: Prostate is unremarkable.  Musculoskeletal: No aggressive or acute appearing osseous lesions. Lower lumbar spine degenerative changes.  IMPRESSION: Interval increase in size of left infrahilar mass. There is resultant bandlike consolidation within the left lower lobe which may represent atelectasis or extension of disease. Interval increase in size of small left pleural effusion.  Interval development of a large area of patchy ground-glass attenuation within the anterior left upper lobe with multiple subpleural areas of ground-glass attenuation predominately within the left lower lobe. Findings may be secondary to an  infectious or inflammatory process or potentially drug reaction/post treatment in etiology. Given the subpleural location of the left lower lobe ground-glass foci, pulmonary infarcts are not entirely excluded. Attention on followup as metastatic disease is not excluded.  Interval development of 2 low-attenuation lesions within the hepatic dome which are incompletely characterized however appear new from prior. As these are new, metastatic disease is of concern.  These results will be called to the ordering clinician or representative by the Radiologist Assistant, and communication documented in the PACS or zVision Dashboard.   Electronically Signed   By: DLovey NewcomerM.D.   On: 07/29/2015 10:28   Ct Abdomen Pelvis W Contrast  07/29/2015   CLINICAL DATA:  Patient with history of small cell lung carcinoma diagnosed 08/18/2014. Status post chemotherapy.  EXAM: CT CHEST, ABDOMEN, AND PELVIS WITH CONTRAST  TECHNIQUE: Multidetector CT imaging of the chest, abdomen and pelvis was performed following the standard protocol during  bolus administration of intravenous contrast.  CONTRAST:  116m OMNIPAQUE IOHEXOL 300 MG/ML  SOLN  COMPARISON:  CT CAP 04/19/2015  FINDINGS: CT CHEST FINDINGS  Mediastinum/Lymph Nodes: Interval increase in size of left infrahilar mass measuring 2.0 x 1.8 cm (image 37; series 2), previously 1.2 x 0.9 cm. Interval increase in size of right hilar lymph node measuring 1.1 cm (image 31; series 2), previously 0.9 cm. The heart is normal in size. Extensive coronary arterial vascular calcifications. Visualized thyroid is unremarkable. No enlarged axillary lymphadenopathy.  Lungs/Pleura: Left infrahilar mass exerts mass effect on the left lower lobe bronchi resulting in bandlike consolidation within the left lower lobe which may represent atelectasis or extension of disease. Interval development of multiple small areas of subpleural ground-glass consolidation particularly within the left lower lobe  measuring up to 1.7 cm (image 31; series 5). Large area of ground-glass attenuation within the anterior aspect of the left upper lobe (image 20 10-2020). Interval increase in size of small left pleural effusion. No pneumothorax.  CT ABDOMEN PELVIS FINDINGS  Hepatobiliary: Interval development of 2 low-attenuation lesions within the hepatic dome measuring 1.2 cm and 0.5 cm (image 52; series 2). No additional focal hepatic lesions are identified. The patient is status post cholecystectomy. There is no intrahepatic or extrahepatic biliary ductal dilatation.  Pancreas: Unremarkable  Spleen: Unremarkable  Adrenals/Urinary Tract: The adrenal glands are normal. The kidneys enhance symmetrically with contrast. No hydronephrosis. Urinary bladder is unremarkable.  Stomach/Bowel: Sigmoid colonic diverticulosis. No CT evidence for acute diverticulitis. The appendix is normal. Small hiatal hernia. Morphology of the stomach is normal. No free fluid or free intraperitoneal air.  Vascular/Lymphatic: Tortuous yet normal caliber abdominal aorta with peripheral calcified atherosclerotic plaque. No retroperitoneal lymphadenopathy.  Other: Prostate is unremarkable.  Musculoskeletal: No aggressive or acute appearing osseous lesions. Lower lumbar spine degenerative changes.  IMPRESSION: Interval increase in size of left infrahilar mass. There is resultant bandlike consolidation within the left lower lobe which may represent atelectasis or extension of disease. Interval increase in size of small left pleural effusion.  Interval development of a large area of patchy ground-glass attenuation within the anterior left upper lobe with multiple subpleural areas of ground-glass attenuation predominately within the left lower lobe. Findings may be secondary to an infectious or inflammatory process or potentially drug reaction/post treatment in etiology. Given the subpleural location of the left lower lobe ground-glass foci, pulmonary infarcts are  not entirely excluded. Attention on followup as metastatic disease is not excluded.  Interval development of 2 low-attenuation lesions within the hepatic dome which are incompletely characterized however appear new from prior. As these are new, metastatic disease is of concern.  These results will be called to the ordering clinician or representative by the Radiologist Assistant, and communication documented in the PACS or zVision Dashboard.   Electronically Signed   By: DLovey NewcomerM.D.   On: 07/29/2015 10:28     ASSESSMENT AND PLAN: This is a very pleasant 64years old white male with extensive stage small cell lung cancer currently undergoing systemic chemotherapy with cisplatin and etoposide status post 6 cycles and tolerated his treatment fairly well. This was followed by prophylactic cranial irradiation. The patient CT scan of the chest, abdomen and pelvis showed findings concerning for disease recurrence in the chest as well as liver. The patient also complains of confusion and weakness. I discussed with him repeating a PET scan as well as MRI of the brain for reevaluation of his condition. I will see him  back for follow-up visit in 2 weeks for discussion of his treatment options based on the imaging studies. CODE STATUS was discussed with the patient and his wife today and he indicated no CODE BLUE. He was advised to call immediately if he has any concerning symptoms in the interval. The patient voices understanding of current disease status and treatment options and is in agreement with the current care plan.  All questions were answered. The patient knows to call the clinic with any problems, questions or concerns. We can certainly see the patient much sooner if necessary.  Disclaimer: This note was dictated with voice recognition software. Similar sounding words can inadvertently be transcribed and may not be corrected upon review.

## 2015-08-09 ENCOUNTER — Ambulatory Visit (HOSPITAL_COMMUNITY)
Admission: RE | Admit: 2015-08-09 | Discharge: 2015-08-09 | Disposition: A | Discharge status: Home / Self Care | Payer: Commercial Managed Care - HMO | Source: Ambulatory Visit | Attending: Internal Medicine | Admitting: Internal Medicine

## 2015-08-09 ENCOUNTER — Telehealth: Payer: Self-pay

## 2015-08-09 DIAGNOSIS — G319 Degenerative disease of nervous system, unspecified: Secondary | ICD-10-CM | POA: Diagnosis not present

## 2015-08-09 DIAGNOSIS — G939 Disorder of brain, unspecified: Secondary | ICD-10-CM | POA: Insufficient documentation

## 2015-08-09 DIAGNOSIS — C3492 Malignant neoplasm of unspecified part of left bronchus or lung: Secondary | ICD-10-CM | POA: Insufficient documentation

## 2015-08-09 DIAGNOSIS — I739 Peripheral vascular disease, unspecified: Secondary | ICD-10-CM | POA: Diagnosis not present

## 2015-08-09 MED ORDER — GADOBENATE DIMEGLUMINE 529 MG/ML IV SOLN
15.0000 mL | Freq: Once | INTRAVENOUS | Status: AC | PRN
  Administered 2015-08-09: 15 mL via INTRAVENOUS

## 2015-08-09 NOTE — Telephone Encounter (Signed)
Sent a message to Dr. Sondra Come to see him for treatment of this new lesion.

## 2015-08-09 NOTE — Telephone Encounter (Signed)
[facility] called with MRI brain results. Dr Jeannine Kitten concerned with finding #1.  Message forwarded to Dr [name].

## 2015-08-11 ENCOUNTER — Other Ambulatory Visit: Payer: Self-pay | Admitting: *Deleted

## 2015-08-11 MED ORDER — BUPROPION HCL ER (SR) 150 MG PO TB12: 150.0000 mg | ORAL_TABLET | Freq: Two times a day (BID) | ORAL | Status: DC

## 2015-08-12 ENCOUNTER — Other Ambulatory Visit: Payer: Self-pay | Admitting: Emergency Medicine

## 2015-08-12 NOTE — Telephone Encounter (Signed)
Error-Disregard message

## 2015-08-16 ENCOUNTER — Ambulatory Visit (HOSPITAL_COMMUNITY)
Admission: RE | Admit: 2015-08-16 | Discharge: 2015-08-16 | Disposition: A | Discharge status: Home / Self Care | Payer: Commercial Managed Care - HMO | Source: Ambulatory Visit | Attending: Internal Medicine | Admitting: Internal Medicine

## 2015-08-16 ENCOUNTER — Encounter: Payer: Self-pay | Admitting: Radiation Oncology

## 2015-08-16 ENCOUNTER — Ambulatory Visit: Payer: Commercial Managed Care - HMO

## 2015-08-16 ENCOUNTER — Ambulatory Visit
Admission: RE | Admit: 2015-08-16 | Discharge: 2015-08-16 | Disposition: A | Discharge status: Home / Self Care | Payer: Commercial Managed Care - HMO | Source: Ambulatory Visit | Attending: Radiation Oncology | Admitting: Radiation Oncology

## 2015-08-16 ENCOUNTER — Telehealth: Payer: Self-pay | Admitting: Internal Medicine

## 2015-08-16 ENCOUNTER — Ambulatory Visit (HOSPITAL_BASED_OUTPATIENT_CLINIC_OR_DEPARTMENT_OTHER): Payer: Commercial Managed Care - HMO | Admitting: Internal Medicine

## 2015-08-16 ENCOUNTER — Other Ambulatory Visit: Payer: Self-pay | Admitting: Radiation Therapy

## 2015-08-16 ENCOUNTER — Other Ambulatory Visit: Payer: Self-pay | Admitting: Internal Medicine

## 2015-08-16 ENCOUNTER — Encounter: Payer: Self-pay | Admitting: Internal Medicine

## 2015-08-16 VITALS — BP 130/60 | HR 97 | Temp 98.6°F | Resp 20 | Ht 69.0 in | Wt 163.8 lb

## 2015-08-16 DIAGNOSIS — Z888 Allergy status to other drugs, medicaments and biological substances status: Secondary | ICD-10-CM | POA: Diagnosis not present

## 2015-08-16 DIAGNOSIS — C786 Secondary malignant neoplasm of retroperitoneum and peritoneum: Secondary | ICD-10-CM | POA: Insufficient documentation

## 2015-08-16 DIAGNOSIS — C3492 Malignant neoplasm of unspecified part of left bronchus or lung: Secondary | ICD-10-CM

## 2015-08-16 DIAGNOSIS — I2510 Atherosclerotic heart disease of native coronary artery without angina pectoris: Secondary | ICD-10-CM | POA: Diagnosis not present

## 2015-08-16 DIAGNOSIS — Z885 Allergy status to narcotic agent status: Secondary | ICD-10-CM | POA: Insufficient documentation

## 2015-08-16 DIAGNOSIS — C7931 Secondary malignant neoplasm of brain: Secondary | ICD-10-CM | POA: Diagnosis present

## 2015-08-16 DIAGNOSIS — K4020 Bilateral inguinal hernia, without obstruction or gangrene, not specified as recurrent: Secondary | ICD-10-CM | POA: Insufficient documentation

## 2015-08-16 DIAGNOSIS — C7B8 Other secondary neuroendocrine tumors: Secondary | ICD-10-CM | POA: Diagnosis not present

## 2015-08-16 DIAGNOSIS — N200 Calculus of kidney: Secondary | ICD-10-CM | POA: Diagnosis not present

## 2015-08-16 DIAGNOSIS — R590 Localized enlarged lymph nodes: Secondary | ICD-10-CM | POA: Insufficient documentation

## 2015-08-16 DIAGNOSIS — Z510 Encounter for antineoplastic radiation therapy: Secondary | ICD-10-CM | POA: Diagnosis present

## 2015-08-16 DIAGNOSIS — K769 Liver disease, unspecified: Secondary | ICD-10-CM | POA: Diagnosis not present

## 2015-08-16 DIAGNOSIS — C7949 Secondary malignant neoplasm of other parts of nervous system: Principal | ICD-10-CM

## 2015-08-16 DIAGNOSIS — J90 Pleural effusion, not elsewhere classified: Secondary | ICD-10-CM | POA: Diagnosis not present

## 2015-08-16 DIAGNOSIS — C3490 Malignant neoplasm of unspecified part of unspecified bronchus or lung: Secondary | ICD-10-CM | POA: Insufficient documentation

## 2015-08-16 DIAGNOSIS — C7A1 Malignant poorly differentiated neuroendocrine tumors: Secondary | ICD-10-CM

## 2015-08-16 LAB — GLUCOSE, CAPILLARY: Glucose-Capillary: 100 mg/dL — ABNORMAL HIGH (ref 65–99)

## 2015-08-16 MED ORDER — LIDOCAINE-PRILOCAINE 2.5-2.5 % EX CREA: 1.0000 "application " | TOPICAL_CREAM | CUTANEOUS | Status: DC | PRN

## 2015-08-16 MED ORDER — FLUDEOXYGLUCOSE F - 18 (FDG) INJECTION
7.9700 | Freq: Once | INTRAVENOUS | Status: DC | PRN
  Administered 2015-08-16: 7.97 via INTRAVENOUS
  Filled 2015-08-16: qty 7.97

## 2015-08-16 MED ORDER — PROCHLORPERAZINE MALEATE 10 MG PO TABS: 10.0000 mg | ORAL_TABLET | Freq: Four times a day (QID) | ORAL | Status: DC | PRN

## 2015-08-16 NOTE — Progress Notes (Addendum)
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 Elbert Ewings: Extensive Stage (T2, N1, M1b) Small cell lung Cancer diagnosed in October 2015.  PRIOR THERAPY:  1) Systemic chemotherapy with cisplatin 60 MG/M2 on day 1 and etoposide 120 MG/M2 on days 1, 2 and 3 with Neulasta support on day 4, status post 6 cycles., , MRI Brain 08/09/15 scheduled PET Scan  08/16/15  Dose of Decadron, if applicable :No  Recent neurologic symptoms, if any:   Seizures: NO  Headaches: NO  Nausea: NO  Dizziness/ataxia: NO  Difficulty with hand coordination: NO  Focal numbness/weakness: lower extremities nad numbness legs   Visual deficits/changes: No  Confusion/Memory deficits: yes, minor expression aphasia, recall   Painful bone metastases at present, if any: generalized pain joints, hips/knees   SAFETY ISSUES:YES   Prior radiation?Yes, Brain 25 Gy 02/11/15-02/25/15   Pacemaker/ICD?  No  Is the patient on methotrexate? No  Additional Complaints / other details:Seen initially by Dr. 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  Wt Readings from Last 3 Encounters:  08/02/15 164 lb 11.2 oz (74.707 kg)  07/22/15 164 lb 9.6 oz (74.662 kg)  07/22/15 164 lb (74.39 kg)

## 2015-08-16 NOTE — Progress Notes (Signed)
[facility] Telephone:([phone]) [phone]   Fax:(336) [place], [place] [address]  DIAGNOSIS: Extensive Stage (T2, N1, M1b) Small cell lung Cancer diagnosed in October 2015.  PRIOR THERAPY:  1) Systemic chemotherapy with cisplatin 60 MG/M2 on day 1 and etoposide 120 MG/M2 on days 1, 2 and 3 with Neulasta support on day 4, status post 6 cycles. 2) prophylactic cranial irradiation under the care of Dr. Sondra Come  CURRENT THERAPY: Systemic chemotherapy with carboplatin for AUC of 5 on day 1 and etoposide 100 MG/M2 on days 1, 2 and 3 with Neulasta support on day 4. First dose is expected on 08/31/2015.  INTERVAL HISTORY: Spencer Long 64 y.o. male returns to the clinic today for follow-up visit accompanied by his wife. The patient continues to complain of increasing fatigue and weakness especially in the lower extremities. He is currently followed by Dr. Buddy Duty, his endocrinologist for the hyponatremia and treated with salt tablets. He still goes to the gym several days the week and doing physical therapy. He denied having any significant chest pain, shortness of breath, cough or hemoptysis. The patient denied having any nausea or vomiting, no fever or chills. He was found on recent CT scan of the chest to have evidence for disease recurrence. I ordered a PET scan as well as MRI of the brain for restaging of his disease. 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 [name] for stereotactic radiotherapy. He is scheduled to have [place] MRI on 08/25/2015 before the procedure. He is here today for evaluation and discussion of his systemic treatment options. MEDICAL HISTORY: Past Medical History  Diagnosis Date  . Hypertension     under control, has been on med. x 15-20 yrs.  [name] GERD (gastroesophageal reflux disease)   . Infection of elbow      right-no infection at present , but has skin lesions present  . Sarcoidosis ([facility])     "tends to have skin flareups" and presently experiencing now"feet, legs and arms"  . Chronic hyponatremia     Dr. Buddy Duty follows  . Radiation 02/11/15-02/25/15    brain 25 Gy  . Lung cancer ([place])   . DNR no code (do not resuscitate) 08/02/2015    ALLERGIES:  is allergic to varenicline; isothiazolinone chloride; oxycodone; and quaternium-15.  MEDICATIONS:  Current Outpatient Prescriptions  Medication Sig Dispense Refill  . Albuterol Sulfate (PROAIR RESPICLICK) 671 (90 BASE) MCG/ACT AEPB Inhale 2 puffs into the lungs every 4 (four) hours as needed. 1 each 2  . amLODipine (NORVASC) 10 MG tablet Take 10 mg by mouth every morning.     [name] aspirin EC 81 MG tablet Take 81 mg by mouth every morning.     [name] atenolol (TENORMIN) 50 MG tablet Take 50 mg by mouth daily.    . Azilsartan Medoxomil (EDARBI) 80 MG TABS Take 1 tablet by mouth every morning.    [name] buPROPion (WELLBUTRIN SR) 150 MG 12 hr tablet Take 1 tablet (150 mg total) by mouth 2 (two) times daily. 180 tablet 0  . diclofenac (VOLTAREN) 75 MG EC tablet Take 75 mg by mouth 2 (two) times daily.    [name] dicyclomine (BENTYL) 20 MG tablet Take 20 mg by mouth every 4 (four) hours as needed for spasms.     . folic acid (FOLVITE) 1 MG tablet Take  1 mg by mouth 2 (two) times daily.    [name] loperamide (IMODIUM) 2 MG capsule Take 2 mg by mouth as needed for diarrhea or loose stools.    . Multiple Vitamins-Minerals (MULTIVITAMIN ADULTS 50+ PO) Take 1 tablet by mouth every morning.     . mupirocin cream (BACTROBAN) 2 % Apply 1 application topically 2 (two) times daily.    . nicotine (NICODERM CQ - DOSED IN MG/24 HOURS) 14 mg/24hr patch Place 1 patch (14 mg total) onto the skin daily. 28 patch 2  . nicotine (NICODERM CQ - DOSED IN MG/24 HR) 7 mg/24hr patch Place 1 patch (7 mg total) onto the skin daily. (Patient not taking: Reported on 08/16/2015) 28 patch 0  . omeprazole  (PRILOSEC) 40 MG capsule Take 40 mg by mouth every morning.    . Probiotic Product (PROBIOTIC DAILY PO) Take 1 tablet by mouth every morning.     . sodium chloride 1 G tablet Take 1 g by mouth 3 (three) times daily.    . Tiotropium Bromide Monohydrate (SPIRIVA RESPIMAT) 1.25 MCG/ACT AERS Inhale 2 puffs into the lungs daily.    . Tiotropium Bromide Monohydrate (SPIRIVA RESPIMAT) 1.25 MCG/ACT AERS Inhale 2 puffs into the lungs daily. 1 Inhaler 12  . white petrolatum (VASELINE) GEL Apply 1 application topically daily as needed for lip care or dry skin.     No current facility-administered medications for this visit.   Facility-Administered Medications Ordered in Other Visits  Medication Dose Route Frequency Provider Last Rate Last Dose  . fludeoxyglucose F - 18 (FDG) injection 7.97 milli Curie  7.97 milli Curie Intravenous Once PRN Medication Radiologist, MD   7.97 milli Curie at 08/16/15 0711    SURGICAL HISTORY:  Past Surgical History  Procedure Laterality Date  . Cholecystectomy  05/2006  . Ulnar nerve repair  01/2008    right; decompression  . Carpal tunnel release  01/2008    right  . Eye surgery  12/2008    correction ectropion, release band, wedge exc., shortening  . I&d extremity  09/12/2011    Procedure: IRRIGATION AND DEBRIDEMENT EXTREMITY;  Surgeon: Tennis Must;  Location: [facility];  Service: Orthopedics;  Laterality: Right;  I&d RIGHT OLECRANON BURSSA   . Tonsillectomy    . Endobronchial ultrasound Bilateral 08/24/2014    Procedure: ENDOBRONCHIAL ULTRASOUND;  Surgeon: Collene Gobble, MD;  Location: [facility];  Service: Cardiopulmonary;  Laterality: Bilateral;    REVIEW OF SYSTEMS:  Constitutional: positive for fatigue Eyes: negative Ears, nose, mouth, throat, and face: negative Respiratory: negative Cardiovascular: negative Gastrointestinal: negative Genitourinary:negative Integument/breast: negative Hematologic/lymphatic:  negative Musculoskeletal:positive for muscle weakness Neurological: positive for coordination problems and weakness Behavioral/Psych: negative Endocrine: negative Allergic/Immunologic: negative   PHYSICAL EXAMINATION: General appearance: alert, cooperative, fatigued and no distress Head: Normocephalic, without obvious abnormality, atraumatic Neck: no adenopathy, no JVD, supple, symmetrical, trachea midline and thyroid not enlarged, symmetric, no tenderness/mass/nodules Lymph nodes: Cervical, supraclavicular, and axillary nodes normal. Resp: clear to auscultation bilaterally Back: symmetric, no curvature. ROM normal. No CVA tenderness. Cardio: regular rate and rhythm, S1, S2 normal, no murmur, click, rub or gallop GI: soft, non-tender; bowel sounds normal; no masses,  no organomegaly Extremities: extremities normal, atraumatic, no cyanosis or edema Neurologic: Alert and oriented X 3, normal strength and tone. Normal symmetric reflexes. Normal coordination and gait  ECOG PERFORMANCE STATUS: 1 - Symptomatic but completely ambulatory  Blood pressure 130/60, pulse 97, temperature 98.6 F (37 C), temperature source Oral, resp. rate  20, height '5\' 9"'$  (1.753 m), weight 163 lb 12.8 oz (74.299 kg), SpO2 99 %.  LABORATORY DATA: Lab Results  Component Value Date   WBC 5.2 07/29/2015   HGB 12.7* 07/29/2015   HCT 35.8* 07/29/2015   MCV 89.4 07/29/2015   PLT 314 07/29/2015      Chemistry      Component Value Date/Time   NA 123* 07/29/2015 0827   NA 125* 09/12/2011 1302   K 5.6* 07/29/2015 0827   K 5.5* 09/12/2011 1302   CL 95* 09/12/2011 1302   CO2 23 07/29/2015 0827   CO2 26 12/25/2008 1425   BUN 13.5 07/29/2015 0827   BUN 19 09/12/2011 1302   CREATININE 0.8 07/29/2015 0827   CREATININE 1.00 09/12/2011 1302      Component Value Date/Time   CALCIUM 9.5 07/29/2015 0827   CALCIUM 9.1 12/25/2008 1425   ALKPHOS 80 07/29/2015 0827   AST 15 07/29/2015 0827   ALT 18 07/29/2015 0827    BILITOT 0.78 07/29/2015 0827       RADIOGRAPHIC STUDIES: Ct Chest W Contrast  07/29/2015  CLINICAL DATA:  Patient with history of small cell lung carcinoma diagnosed 08/18/2014. Status post chemotherapy. EXAM: CT CHEST, ABDOMEN, AND PELVIS WITH CONTRAST TECHNIQUE: Multidetector CT imaging of the chest, abdomen and pelvis was performed following the standard protocol during bolus administration of intravenous contrast. CONTRAST:  138m OMNIPAQUE IOHEXOL 300 MG/ML  SOLN COMPARISON:  CT CAP 04/19/2015 FINDINGS: CT CHEST FINDINGS Mediastinum/Lymph Nodes: Interval increase in size of left infrahilar mass measuring 2.0 x 1.8 cm (image 37; series 2), previously 1.2 x 0.9 cm. Interval increase in size of right hilar lymph node measuring 1.1 cm (image 31; series 2), previously 0.9 cm. The heart is normal in size. Extensive coronary arterial vascular calcifications. Visualized thyroid is unremarkable. No enlarged axillary lymphadenopathy. Lungs/Pleura: Left infrahilar mass exerts mass effect on the left lower lobe bronchi resulting in bandlike consolidation within the left lower lobe which may represent atelectasis or extension of disease. Interval development of multiple small areas of subpleural ground-glass consolidation particularly within the left lower lobe measuring up to 1.7 cm (image 31; series 5). Large area of ground-glass attenuation within the anterior aspect of the left upper lobe (image 20 10-2020). Interval increase in size of small left pleural effusion. No pneumothorax. CT ABDOMEN PELVIS FINDINGS Hepatobiliary: Interval development of 2 low-attenuation lesions within the hepatic dome measuring 1.2 cm and 0.5 cm (image 52; series 2). No additional focal hepatic lesions are identified. The patient is status post cholecystectomy. There is no intrahepatic or extrahepatic biliary ductal dilatation. Pancreas: Unremarkable Spleen: Unremarkable Adrenals/Urinary Tract: The adrenal glands are normal. The  kidneys enhance symmetrically with contrast. No hydronephrosis. Urinary bladder is unremarkable. Stomach/Bowel: Sigmoid colonic diverticulosis. No CT evidence for acute diverticulitis. The appendix is normal. Small hiatal hernia. Morphology of the stomach is normal. No free fluid or free intraperitoneal air. Vascular/Lymphatic: Tortuous yet normal caliber abdominal aorta with peripheral calcified atherosclerotic plaque. No retroperitoneal lymphadenopathy. Other: Prostate is unremarkable. Musculoskeletal: No aggressive or acute appearing osseous lesions. Lower lumbar spine degenerative changes. IMPRESSION: Interval increase in size of left infrahilar mass. There is resultant bandlike consolidation within the left lower lobe which may represent atelectasis or extension of disease. Interval increase in size of small left pleural effusion. Interval development of a large area of patchy ground-glass attenuation within the anterior left upper lobe with multiple subpleural areas of ground-glass attenuation predominately within the left lower lobe. Findings may be  secondary to an infectious or inflammatory process or potentially drug reaction/post treatment in etiology. Given the subpleural location of the left lower lobe ground-glass foci, pulmonary infarcts are not entirely excluded. Attention on followup as metastatic disease is not excluded. Interval development of 2 low-attenuation lesions within the hepatic dome which are incompletely characterized however appear new from prior. As these are new, metastatic disease is of concern. These results will be called to the ordering clinician or representative by the Radiologist Assistant, and communication documented in the PACS or zVision Dashboard. Electronically Signed   By: Lovey Newcomer M.D.   On: 07/29/2015 10:28   Mr Jeri Cos NI Contrast  08/09/2015  CLINICAL DATA:  64 year old hypertensive male with small cell lung cancer diagnosed 08/18/2014. Post chemotherapy.  Subsequent encounter. EXAM: MRI HEAD WITHOUT AND WITH CONTRAST TECHNIQUE: Multiplanar, multiecho pulse sequences of the brain and surrounding structures were obtained without and with intravenous contrast. CONTRAST:  20m MULTIHANCE GADOBENATE DIMEGLUMINE 529 MG/ML IV SOLN COMPARISON:  02/03/2015 and 09/10/2014 MR. FINDINGS: New 1.4 x 0.8 x 0.9 cm enhancing lesion inferior aspect of the left cerebellum suggestive of intracranial metastatic disease. No other intracranial enhancing lesion identified. Interval improvement of appearance of the clivus without destructive lesion identified. No acute infarct. No intracranial hemorrhage. Prominent small vessel disease type changes. Global atrophy without hydrocephalus. Small left vertebral artery may predominantly end in a posterior inferior cerebellar artery distribution without change. Ectatic right vertebral artery and basilar artery. Internal carotid arteries are patent. Opacification mastoid air cells bilaterally without obstructing lesion of the eustachian tube identified. Minimal to mild mucosal thickening paranasal sinuses. No orbital abnormality identified. Cervical medullary junction, pituitary region and pineal region unremarkable. IMPRESSION: New 1.4 x 0.8 x 0.9 cm enhancing lesion inferior aspect of the left cerebellum suggestive of intracranial metastatic disease. No other intracranial enhancing lesion identified. Interval improvement of appearance of the clivus without destructive lesion identified. Prominent small vessel disease type changes. Global atrophy without hydrocephalus. Opacification mastoid air cells bilaterally. Minimal to mild mucosal thickening paranasal sinuses. These results will be called to the ordering clinician or representative by the Radiologist Assistant, and communication documented in the PACS or zVision Dashboard. Electronically Signed   By: SGenia DelM.D.   On: 08/09/2015 08:16   Ct Abdomen Pelvis W Contrast  07/29/2015   CLINICAL DATA:  Patient with history of small cell lung carcinoma diagnosed 08/18/2014. Status post chemotherapy. EXAM: CT CHEST, ABDOMEN, AND PELVIS WITH CONTRAST TECHNIQUE: Multidetector CT imaging of the chest, abdomen and pelvis was performed following the standard protocol during bolus administration of intravenous contrast. CONTRAST:  1053mOMNIPAQUE IOHEXOL 300 MG/ML  SOLN COMPARISON:  CT CAP 04/19/2015 FINDINGS: CT CHEST FINDINGS Mediastinum/Lymph Nodes: Interval increase in size of left infrahilar mass measuring 2.0 x 1.8 cm (image 37; series 2), previously 1.2 x 0.9 cm. Interval increase in size of right hilar lymph node measuring 1.1 cm (image 31; series 2), previously 0.9 cm. The heart is normal in size. Extensive coronary arterial vascular calcifications. Visualized thyroid is unremarkable. No enlarged axillary lymphadenopathy. Lungs/Pleura: Left infrahilar mass exerts mass effect on the left lower lobe bronchi resulting in bandlike consolidation within the left lower lobe which may represent atelectasis or extension of disease. Interval development of multiple small areas of subpleural ground-glass consolidation particularly within the left lower lobe measuring up to 1.7 cm (image 31; series 5). Large area of ground-glass attenuation within the anterior aspect of the left upper lobe (image 20 10-2020). Interval increase in  size of small left pleural effusion. No pneumothorax. CT ABDOMEN PELVIS FINDINGS Hepatobiliary: Interval development of 2 low-attenuation lesions within the hepatic dome measuring 1.2 cm and 0.5 cm (image 52; series 2). No additional focal hepatic lesions are identified. The patient is status post cholecystectomy. There is no intrahepatic or extrahepatic biliary ductal dilatation. Pancreas: Unremarkable Spleen: Unremarkable Adrenals/Urinary Tract: The adrenal glands are normal. The kidneys enhance symmetrically with contrast. No hydronephrosis. Urinary bladder is unremarkable.  Stomach/Bowel: Sigmoid colonic diverticulosis. No CT evidence for acute diverticulitis. The appendix is normal. Small hiatal hernia. Morphology of the stomach is normal. No free fluid or free intraperitoneal air. Vascular/Lymphatic: Tortuous yet normal caliber abdominal aorta with peripheral calcified atherosclerotic plaque. No retroperitoneal lymphadenopathy. Other: Prostate is unremarkable. Musculoskeletal: No aggressive or acute appearing osseous lesions. Lower lumbar spine degenerative changes. IMPRESSION: Interval increase in size of left infrahilar mass. There is resultant bandlike consolidation within the left lower lobe which may represent atelectasis or extension of disease. Interval increase in size of small left pleural effusion. Interval development of a large area of patchy ground-glass attenuation within the anterior left upper lobe with multiple subpleural areas of ground-glass attenuation predominately within the left lower lobe. Findings may be secondary to an infectious or inflammatory process or potentially drug reaction/post treatment in etiology. Given the subpleural location of the left lower lobe ground-glass foci, pulmonary infarcts are not entirely excluded. Attention on followup as metastatic disease is not excluded. Interval development of 2 low-attenuation lesions within the hepatic dome which are incompletely characterized however appear new from prior. As these are new, metastatic disease is of concern. These results will be called to the ordering clinician or representative by the Radiologist Assistant, and communication documented in the PACS or zVision Dashboard. Electronically Signed   By: Lovey Newcomer M.D.   On: 07/29/2015 10:28   Nm Pet Image Restag (ps) Skull Base To Thigh  08/16/2015  CLINICAL DATA:  Subsequent treatment strategy for small cell left lung cancer. EXAM: NUCLEAR MEDICINE PET SKULL BASE TO THIGH TECHNIQUE: 8.0 mCi F-18 FDG was injected intravenously. Full-ring  PET imaging was performed from the skull base to thigh after the radiotracer. CT data was obtained and used for attenuation correction and anatomic localization. FASTING BLOOD GLUCOSE:  Value: 100 mg/dl COMPARISON:  MR brain 08/09/2015, CT chest abdomen pelvis 07/29/2015 and PET 09/11/2014. FINDINGS: NECK No hypermetabolic lymph nodes in the neck. CT images show no acute findings. CHEST Left infrahilar mass/adenopathy has an SUV max of 15.6 (lymph node better measured on 07/29/2015) and measures approximately 2.4 cm in short axis (series 6, image 43), previously 2.1 cm. Uptake within the right hilum has an SUV max of 4.1. Subpleural ground-glass in the superior segment left lower lobe (series 6, image 23), shows mild hypermetabolism with an SUV max of 2.7. Subpleural ground-glass seen more inferiorly within the medial left lower lobe (image 42) is not hypermetabolic and is somewhat progressive from 07/29/2015. CT images show three-vessel coronary artery calcification. No pericardial effusion. Trace left pleural effusion. Left infrahilar mass causes obstruction or narrowing of adjacent bronchi. ABDOMEN/PELVIS A new mesenteric nodule is seen in the right lower quadrant, measuring 1.5 cm in long axis (series 4, image 153), with an SUV max of 12.8. No hypermetabolic adenopathy. No abnormal hypermetabolism in the liver, adrenal glands, spleen or pancreas. CT images show low-attenuation lesions in the dome of the liver, measuring up to 11 mm, stable from 07/29/2015. These are not hypermetabolic but may be too small for  PET resolution. Cholecystectomy. Adrenal glands are unremarkable. Stones are seen in the right kidney. Kidneys, spleen, pancreas, stomach and bowel are otherwise grossly unremarkable. Bilateral inguinal hernias contain fat. Atherosclerotic calcification of the arterial vasculature without abdominal aortic aneurysm. SKELETON No abnormal osseous hypermetabolism. Degenerative changes are seen in the spine.  IMPRESSION: 1. Enlarging hypermetabolic left infrahilar mass/adenopathy with postobstructive changes in the left lower lobe. 2. Mild hypermetabolism in the right hilum, nonspecific. 3. New hypermetabolic mesenteric metastasis. 4. Low-attenuation lesions in the dome of the liver are unchanged from 07/29/2015 but may be too small for PET resolution. Metastatic disease is not excluded. 5. Trace left pleural effusion. 6. Right renal stones. Electronically Signed   By: Lorin Picket M.D.   On: 08/16/2015 09:26     ASSESSMENT AND PLAN: This is a very pleasant 64 years old white male with extensive stage small cell lung cancer currently undergoing systemic chemotherapy with cisplatin and etoposide status post 6 cycles and tolerated his treatment fairly well. This was followed by prophylactic cranial irradiation. Unfortunately the recent MRI of the brain as well as PET scan showed evidence for disease recurrence. The patient is seen by Dr. [name] and scheduled to have stereotactic radiotherapy to the metastatic brain lesions later this month. Regarding the systemic disease progression, I reviewed the PET scan results and showed the images to the patient and his wife. He was given the option of palliative care versus proceeding with palliative systemic chemotherapy. His last treatment with cisplatin and etoposide was in February 2016. I felt that the patient may benefit from treatment again with carboplatin and etoposide since it has been more than 6 months from the first line therapy. He is interested in treatment and I recommended for him regimen consisting of carboplatin for AUC of 5 on day 1 and etoposide 100 MG/M2 on days 1, 2 and 3 with Neulasta support on day 4. He is expected to start the first cycle of this treatment on 08/31/2015 after completion of his stereotactic radiotherapy to the brain. I discussed with the patient's adverse effect of this treatment including but not limited to alopecia,  myelosuppression, nausea and vomiting, peripheral neuropathy, liver or renal dysfunction. For the hyponatremia, he will continue with the salt tablets as prescribed by Dr. Buddy Duty. I will also refer the patient to interventional radiology for consideration of Port-A-Cath placement. I will send a prescription to his pharmacy with Compazine 10 mg by mouth every 6 hours as needed for nausea in addition to Emla cream to be applied to the Port-A-Cath site before treatment. CODE STATUS: no CODE BLUE. The patient would come back for follow-up visit in 2 weeks for reevaluation before starting the first dose of his systemic chemotherapy.  He was advised to call immediately if he has any concerning symptoms in the interval. The patient voices understanding of current disease status and treatment options and is in agreement with the current care plan.  All questions were answered. The patient knows to call the clinic with any problems, questions or concerns. We can certainly see the patient much sooner if necessary.  Disclaimer: This note was dictated with voice recognition software. Similar sounding words can inadvertently be transcribed and may not be corrected upon review.

## 2015-08-16 NOTE — Progress Notes (Signed)
Please see the Nurse Progress Note in the MD Initial Consult Encounter for this patient. 

## 2015-08-16 NOTE — Telephone Encounter (Signed)
gv and printed appt sched and avs for pt for NOV thru Dec

## 2015-08-16 NOTE — Progress Notes (Addendum)
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 Sondra Come  Narrative: The patient returns today for reconsultation regarding newly found brain metastasis from small cell lung cancer. The patient has recently undergone PET (08/16/15), CT ( 07/29/15) and MRI (08/09/15) imaging which have all revealed evidence of disease progression. The patient presents with symptoms including: increased confusion, weakness in lower extremities and fatigue.    The patient was originally seen by Dr.Kinard (09/17/14) for treatment of small cell lung cancer.   Recent MRI scan shows a solitary metastasis which was discussed at multidisciplinary brain conference, where it was recommended that the patient was a good candidate to proceed with radiosurgery.  ALLERGIES:  is allergic to varenicline; isothiazolinone chloride; oxycodone; and quaternium-15.  Meds: Current Outpatient Prescriptions  Medication Sig Dispense Refill  . atenolol (TENORMIN) 50 MG tablet Take 50 mg by mouth daily.    . Albuterol Sulfate (PROAIR RESPICLICK) 269 (90 BASE) MCG/ACT AEPB Inhale 2 puffs into the lungs every 4 (four) hours as needed. 1 each 2  . amLODipine (NORVASC) 10 MG tablet Take 10 mg by mouth every morning.     [name] aspirin EC 81 MG tablet Take 81 mg by mouth every morning.     . Azilsartan Medoxomil (EDARBI) 80 MG TABS Take 1 tablet by mouth every morning.    [name] buPROPion (WELLBUTRIN SR) 150 MG 12 hr tablet Take 1 tablet (150 mg total) by mouth 2 (two) times daily. 180 tablet 0  . diclofenac (VOLTAREN) 75 MG EC tablet Take 75 mg by mouth 2 (two) times daily.    [name] dicyclomine (BENTYL) 20 MG tablet Take  20 mg by mouth every 4 (four) hours as needed for spasms.     . folic acid (FOLVITE) 1 MG tablet Take 1 mg by mouth 2 (two) times daily.    [name] lidocaine-prilocaine (EMLA) cream Apply 1 application topically as needed. 30 g 0  . loperamide (IMODIUM) 2 MG capsule Take 2 mg by mouth as needed for diarrhea or loose stools.    . Multiple Vitamins-Minerals (MULTIVITAMIN ADULTS 50+ PO) Take 1 tablet by mouth every morning.     . mupirocin cream (BACTROBAN) 2 % Apply 1 application topically 2 (two) times daily.    . nicotine (NICODERM CQ - DOSED IN MG/24 HOURS) 14 mg/24hr patch Place 1 patch (14 mg total) onto the skin daily. 28 patch 2  . nicotine (NICODERM CQ - DOSED IN MG/24 HR) 7 mg/24hr patch Place 1 patch (7 mg total) onto the skin daily. 28 patch 0  . omeprazole (PRILOSEC) 40 MG capsule Take 40 mg by mouth every morning.    . Probiotic Product (PROBIOTIC DAILY PO) Take 1 tablet by mouth every morning.     . prochlorperazine (COMPAZINE) 10 MG tablet Take 1 tablet (10 mg total) by mouth every 6 (six) hours as needed for nausea or vomiting. (Patient not taking: Reported on 08/16/2015) 30 tablet 0  . sodium chloride 1 G tablet Take 1 g by mouth 3 (three) times daily.    . Tiotropium Bromide Monohydrate (SPIRIVA RESPIMAT) 1.25 MCG/ACT AERS Inhale 2 puffs into the lungs daily.    . white petrolatum (VASELINE)  GEL Apply 1 application topically daily as needed for lip care or dry skin.     No current facility-administered medications for this encounter.   Facility-Administered Medications Ordered in Other Encounters  Medication Dose Route Frequency Provider Last Rate Last Dose  . fludeoxyglucose F - 18 (FDG) injection 7.97 milli Curie  7.97 milli Curie Intravenous Once PRN Medication Radiologist, MD   7.97 milli Curie at 08/16/15 0711    Physical Findings: The patient is in no acute distress. Patient is alert and oriented.  vitals were not taken for this visit..     Lab Findings: Lab Results    Component Value Date   WBC 5.2 07/29/2015   HGB 12.7* 07/29/2015   HCT 35.8* 07/29/2015   MCV 89.4 07/29/2015   PLT 314 07/29/2015     Radiographic Findings: Ct Chest W Contrast  07/29/2015  CLINICAL DATA:  Patient with history of small cell lung carcinoma diagnosed 08/18/2014. Status post chemotherapy. EXAM: CT CHEST, ABDOMEN, AND PELVIS WITH CONTRAST TECHNIQUE: Multidetector CT imaging of the chest, abdomen and pelvis was performed following the standard protocol during bolus administration of intravenous contrast. CONTRAST:  191m OMNIPAQUE IOHEXOL 300 MG/ML  SOLN COMPARISON:  CT CAP 04/19/2015 FINDINGS: CT CHEST FINDINGS Mediastinum/Lymph Nodes: Interval increase in size of left infrahilar mass measuring 2.0 x 1.8 cm (image 37; series 2), previously 1.2 x 0.9 cm. Interval increase in size of right hilar lymph node measuring 1.1 cm (image 31; series 2), previously 0.9 cm. The heart is normal in size. Extensive coronary arterial vascular calcifications. Visualized thyroid is unremarkable. No enlarged axillary lymphadenopathy. Lungs/Pleura: Left infrahilar mass exerts mass effect on the left lower lobe bronchi resulting in bandlike consolidation within the left lower lobe which may represent atelectasis or extension of disease. Interval development of multiple small areas of subpleural ground-glass consolidation particularly within the left lower lobe measuring up to 1.7 cm (image 31; series 5). Large area of ground-glass attenuation within the anterior aspect of the left upper lobe (image 20 10-2020). Interval increase in size of small left pleural effusion. No pneumothorax. CT ABDOMEN PELVIS FINDINGS Hepatobiliary: Interval development of 2 low-attenuation lesions within the hepatic dome measuring 1.2 cm and 0.5 cm (image 52; series 2). No additional focal hepatic lesions are identified. The patient is status post cholecystectomy. There is no intrahepatic or extrahepatic biliary ductal dilatation.  Pancreas: Unremarkable Spleen: Unremarkable Adrenals/Urinary Tract: The adrenal glands are normal. The kidneys enhance symmetrically with contrast. No hydronephrosis. Urinary bladder is unremarkable. Stomach/Bowel: Sigmoid colonic diverticulosis. No CT evidence for acute diverticulitis. The appendix is normal. Small hiatal hernia. Morphology of the stomach is normal. No free fluid or free intraperitoneal air. Vascular/Lymphatic: Tortuous yet normal caliber abdominal aorta with peripheral calcified atherosclerotic plaque. No retroperitoneal lymphadenopathy. Other: Prostate is unremarkable. Musculoskeletal: No aggressive or acute appearing osseous lesions. Lower lumbar spine degenerative changes. IMPRESSION: Interval increase in size of left infrahilar mass. There is resultant bandlike consolidation within the left lower lobe which may represent atelectasis or extension of disease. Interval increase in size of small left pleural effusion. Interval development of a large area of patchy ground-glass attenuation within the anterior left upper lobe with multiple subpleural areas of ground-glass attenuation predominately within the left lower lobe. Findings may be secondary to an infectious or inflammatory process or potentially drug reaction/post treatment in etiology. Given the subpleural location of the left lower lobe ground-glass foci, pulmonary infarcts are not entirely excluded. Attention on followup as metastatic disease is not excluded. Interval  development of 2 low-attenuation lesions within the hepatic dome which are incompletely characterized however appear new from prior. As these are new, metastatic disease is of concern. These results will be called to the ordering clinician or representative by the Radiologist Assistant, and communication documented in the PACS or zVision Dashboard. Electronically Signed   By: Lovey Newcomer M.D.   On: 07/29/2015 10:28   Mr Jeri Cos DS Contrast  08/09/2015  CLINICAL DATA:   64 year old hypertensive male with small cell lung cancer diagnosed 08/18/2014. Post chemotherapy. Subsequent encounter. EXAM: MRI HEAD WITHOUT AND WITH CONTRAST TECHNIQUE: Multiplanar, multiecho pulse sequences of the brain and surrounding structures were obtained without and with intravenous contrast. CONTRAST:  7m MULTIHANCE GADOBENATE DIMEGLUMINE 529 MG/ML IV SOLN COMPARISON:  02/03/2015 and 09/10/2014 MR. FINDINGS: New 1.4 x 0.8 x 0.9 cm enhancing lesion inferior aspect of the left cerebellum suggestive of intracranial metastatic disease. No other intracranial enhancing lesion identified. Interval improvement of appearance of the clivus without destructive lesion identified. No acute infarct. No intracranial hemorrhage. Prominent small vessel disease type changes. Global atrophy without hydrocephalus. Small left vertebral artery may predominantly end in a posterior inferior cerebellar artery distribution without change. Ectatic right vertebral artery and basilar artery. Internal carotid arteries are patent. Opacification mastoid air cells bilaterally without obstructing lesion of the eustachian tube identified. Minimal to mild mucosal thickening paranasal sinuses. No orbital abnormality identified. Cervical medullary junction, pituitary region and pineal region unremarkable. IMPRESSION: New 1.4 x 0.8 x 0.9 cm enhancing lesion inferior aspect of the left cerebellum suggestive of intracranial metastatic disease. No other intracranial enhancing lesion identified. Interval improvement of appearance of the clivus without destructive lesion identified. Prominent small vessel disease type changes. Global atrophy without hydrocephalus. Opacification mastoid air cells bilaterally. Minimal to mild mucosal thickening paranasal sinuses. These results will be called to the ordering clinician or representative by the Radiologist Assistant, and communication documented in the PACS or zVision Dashboard. Electronically Signed    By: SGenia DelM.D.   On: 08/09/2015 08:16   Ct Abdomen Pelvis W Contrast  07/29/2015  CLINICAL DATA:  Patient with history of small cell lung carcinoma diagnosed 08/18/2014. Status post chemotherapy. EXAM: CT CHEST, ABDOMEN, AND PELVIS WITH CONTRAST TECHNIQUE: Multidetector CT imaging of the chest, abdomen and pelvis was performed following the standard protocol during bolus administration of intravenous contrast. CONTRAST:  1040mOMNIPAQUE IOHEXOL 300 MG/ML  SOLN COMPARISON:  CT CAP 04/19/2015 FINDINGS: CT CHEST FINDINGS Mediastinum/Lymph Nodes: Interval increase in size of left infrahilar mass measuring 2.0 x 1.8 cm (image 37; series 2), previously 1.2 x 0.9 cm. Interval increase in size of right hilar lymph node measuring 1.1 cm (image 31; series 2), previously 0.9 cm. The heart is normal in size. Extensive coronary arterial vascular calcifications. Visualized thyroid is unremarkable. No enlarged axillary lymphadenopathy. Lungs/Pleura: Left infrahilar mass exerts mass effect on the left lower lobe bronchi resulting in bandlike consolidation within the left lower lobe which may represent atelectasis or extension of disease. Interval development of multiple small areas of subpleural ground-glass consolidation particularly within the left lower lobe measuring up to 1.7 cm (image 31; series 5). Large area of ground-glass attenuation within the anterior aspect of the left upper lobe (image 20 10-2020). Interval increase in size of small left pleural effusion. No pneumothorax. CT ABDOMEN PELVIS FINDINGS Hepatobiliary: Interval development of 2 low-attenuation lesions within the hepatic dome measuring 1.2 cm and 0.5 cm (image 52; series 2). No additional focal hepatic lesions are identified. The  patient is status post cholecystectomy. There is no intrahepatic or extrahepatic biliary ductal dilatation. Pancreas: Unremarkable Spleen: Unremarkable Adrenals/Urinary Tract: The adrenal glands are normal. The kidneys  enhance symmetrically with contrast. No hydronephrosis. Urinary bladder is unremarkable. Stomach/Bowel: Sigmoid colonic diverticulosis. No CT evidence for acute diverticulitis. The appendix is normal. Small hiatal hernia. Morphology of the stomach is normal. No free fluid or free intraperitoneal air. Vascular/Lymphatic: Tortuous yet normal caliber abdominal aorta with peripheral calcified atherosclerotic plaque. No retroperitoneal lymphadenopathy. Other: Prostate is unremarkable. Musculoskeletal: No aggressive or acute appearing osseous lesions. Lower lumbar spine degenerative changes. IMPRESSION: Interval increase in size of left infrahilar mass. There is resultant bandlike consolidation within the left lower lobe which may represent atelectasis or extension of disease. Interval increase in size of small left pleural effusion. Interval development of a large area of patchy ground-glass attenuation within the anterior left upper lobe with multiple subpleural areas of ground-glass attenuation predominately within the left lower lobe. Findings may be secondary to an infectious or inflammatory process or potentially drug reaction/post treatment in etiology. Given the subpleural location of the left lower lobe ground-glass foci, pulmonary infarcts are not entirely excluded. Attention on followup as metastatic disease is not excluded. Interval development of 2 low-attenuation lesions within the hepatic dome which are incompletely characterized however appear new from prior. As these are new, metastatic disease is of concern. These results will be called to the ordering clinician or representative by the Radiologist Assistant, and communication documented in the PACS or zVision Dashboard. Electronically Signed   By: Lovey Newcomer M.D.   On: 07/29/2015 10:28   Nm Pet Image Restag (ps) Skull Base To Thigh  08/16/2015  CLINICAL DATA:  Subsequent treatment strategy for small cell left lung cancer. EXAM: NUCLEAR MEDICINE PET  SKULL BASE TO THIGH TECHNIQUE: 8.0 mCi F-18 FDG was injected intravenously. Full-ring PET imaging was performed from the skull base to thigh after the radiotracer. CT data was obtained and used for attenuation correction and anatomic localization. FASTING BLOOD GLUCOSE:  Value: 100 mg/dl COMPARISON:  MR brain 08/09/2015, CT chest abdomen pelvis 07/29/2015 and PET 09/11/2014. FINDINGS: NECK No hypermetabolic lymph nodes in the neck. CT images show no acute findings. CHEST Left infrahilar mass/adenopathy has an SUV max of 15.6 (lymph node better measured on 07/29/2015) and measures approximately 2.4 cm in short axis (series 6, image 43), previously 2.1 cm. Uptake within the right hilum has an SUV max of 4.1. Subpleural ground-glass in the superior segment left lower lobe (series 6, image 23), shows mild hypermetabolism with an SUV max of 2.7. Subpleural ground-glass seen more inferiorly within the medial left lower lobe (image 42) is not hypermetabolic and is somewhat progressive from 07/29/2015. CT images show three-vessel coronary artery calcification. No pericardial effusion. Trace left pleural effusion. Left infrahilar mass causes obstruction or narrowing of adjacent bronchi. ABDOMEN/PELVIS A new mesenteric nodule is seen in the right lower quadrant, measuring 1.5 cm in long axis (series 4, image 153), with an SUV max of 12.8. No hypermetabolic adenopathy. No abnormal hypermetabolism in the liver, adrenal glands, spleen or pancreas. CT images show low-attenuation lesions in the dome of the liver, measuring up to 11 mm, stable from 07/29/2015. These are not hypermetabolic but may be too small for PET resolution. Cholecystectomy. Adrenal glands are unremarkable. Stones are seen in the right kidney. Kidneys, spleen, pancreas, stomach and bowel are otherwise grossly unremarkable. Bilateral inguinal hernias contain fat. Atherosclerotic calcification of the arterial vasculature without abdominal aortic aneurysm.  SKELETON  No abnormal osseous hypermetabolism. Degenerative changes are seen in the spine. IMPRESSION: 1. Enlarging hypermetabolic left infrahilar mass/adenopathy with postobstructive changes in the left lower lobe. 2. Mild hypermetabolism in the right hilum, nonspecific. 3. New hypermetabolic mesenteric metastasis. 4. Low-attenuation lesions in the dome of the liver are unchanged from 07/29/2015 but may be too small for PET resolution. Metastatic disease is not excluded. 5. Trace left pleural effusion. 6. Right renal stones. Electronically Signed   By: Lorin Picket M.D.   On: 08/16/2015 09:26    Impression: CT of the chest/abdomen and PET scan has confirmed an increase in size of the left infrahilar mass/adenopathy with postobstructive changes in the left lower lobe. In addition, MRI of the brain has revealed a new 1.4 x 0.8 x 0.9 cm enhancing lesion inferior aspect of the left cerebellum suggestive of intracranial metastatic disease.   PET scan has revealed concerning lesion in the dome of the liver, this will be closely monitored.  The patient would benefit from radiosurgery to the new brain mass.   Additionally, the patient does have some progressive disease involving the left infrahilar region within the thoracic cavity.This is not very bulky and is not causing significant compression on the major airways currently, nor does it appear to be at risk of this.I believe this is something that could be followed and the patient is going to discuss possible additional systemic treatment with Dr. Earlie Server in the near future.If it is felt that consolidative, palliative radiation treatment to the thorax would be beneficial now or in the future then this certainly could be evaluated in our clinic.  Plan:  We discussed the possible side effects and risks of treatment in addition to the possible benefits of treatment. We discussed the protocol for radiation treatment.  All of the patient's questions were  answered. The patient does wish to proceed with this treatment and has signed a consent form. A simulation will be scheduled such that we can proceed with treatment planning. There is somewhat limited data regarding the issue of radiosurgery for small cell lung cancer. However, a [place] study did indicate a benefit in selected patients with small cell lung cancer who had undergone previous whole brain radiation treatment in the absence of carcinomatosis. The patient will be treated to the solitary metastasis to a total dose of 20 gray in 1 fraction as recommended by the multidisciplinary brain conference earlier today.      ------------------------------------------------  Jodelle Gross, MD, PhD  This document serves as a record of services personally performed by Kyung Rudd, MD. It was created on his behalf by Derek Mound, a trained medical scribe. The creation of this record is based on the scribe's personal observations and the provider's statements to them. This document has been checked and approved by the attending provider.

## 2015-08-17 ENCOUNTER — Other Ambulatory Visit: Payer: Self-pay | Admitting: Internal Medicine

## 2015-08-17 ENCOUNTER — Telehealth: Payer: Self-pay | Admitting: Internal Medicine

## 2015-08-17 ENCOUNTER — Other Ambulatory Visit: Payer: Self-pay | Admitting: Medical Oncology

## 2015-08-17 DIAGNOSIS — C3490 Malignant neoplasm of unspecified part of unspecified bronchus or lung: Secondary | ICD-10-CM

## 2015-08-17 MED ORDER — LIDOCAINE-PRILOCAINE 2.5-2.5 % EX CREA: 1.0000 "application " | TOPICAL_CREAM | CUTANEOUS | Status: DC | PRN

## 2015-08-17 MED ORDER — PROCHLORPERAZINE MALEATE 10 MG PO TABS: 10.0000 mg | ORAL_TABLET | Freq: Four times a day (QID) | ORAL | Status: DC | PRN

## 2015-08-17 NOTE — Progress Notes (Signed)
rx sent to [facility] ( compazine and emla cream)

## 2015-08-17 NOTE — Telephone Encounter (Signed)
s.w. pt and advised on cx appt and with new chemo start date

## 2015-08-19 ENCOUNTER — Other Ambulatory Visit: Payer: Self-pay | Admitting: Radiology

## 2015-08-20 ENCOUNTER — Other Ambulatory Visit: Payer: Self-pay | Admitting: Radiology

## 2015-08-23 ENCOUNTER — Ambulatory Visit (HOSPITAL_COMMUNITY)
Admission: RE | Admit: 2015-08-23 | Discharge: 2015-08-23 | Disposition: A | Discharge status: Home / Self Care | Payer: Commercial Managed Care - HMO | Source: Ambulatory Visit | Attending: Internal Medicine | Admitting: Internal Medicine

## 2015-08-23 ENCOUNTER — Encounter (HOSPITAL_COMMUNITY): Payer: Self-pay

## 2015-08-23 DIAGNOSIS — E871 Hypo-osmolality and hyponatremia: Secondary | ICD-10-CM | POA: Diagnosis not present

## 2015-08-23 DIAGNOSIS — Z87891 Personal history of nicotine dependence: Secondary | ICD-10-CM | POA: Diagnosis not present

## 2015-08-23 DIAGNOSIS — I10 Essential (primary) hypertension: Secondary | ICD-10-CM | POA: Diagnosis not present

## 2015-08-23 DIAGNOSIS — Z7982 Long term (current) use of aspirin: Secondary | ICD-10-CM | POA: Diagnosis not present

## 2015-08-23 DIAGNOSIS — C3492 Malignant neoplasm of unspecified part of left bronchus or lung: Secondary | ICD-10-CM | POA: Diagnosis not present

## 2015-08-23 DIAGNOSIS — J90 Pleural effusion, not elsewhere classified: Secondary | ICD-10-CM | POA: Diagnosis not present

## 2015-08-23 DIAGNOSIS — K219 Gastro-esophageal reflux disease without esophagitis: Secondary | ICD-10-CM | POA: Diagnosis not present

## 2015-08-23 DIAGNOSIS — Z539 Procedure and treatment not carried out, unspecified reason: Secondary | ICD-10-CM | POA: Insufficient documentation

## 2015-08-23 LAB — BASIC METABOLIC PANEL
ANION GAP: 8 (ref 5–15)
BUN: 15 mg/dL (ref 6–20)
CHLORIDE: 88 mmol/L — AB (ref 101–111)
CO2: 26 mmol/L (ref 22–32)
CREATININE: 0.67 mg/dL (ref 0.61–1.24)
Calcium: 9 mg/dL (ref 8.9–10.3)
GFR calc non Af Amer: >60 mL/min (ref >60)
Glucose, Bld: 105 mg/dL — ABNORMAL HIGH (ref 65–99)
Potassium: 4.6 mmol/L (ref 3.5–5.1)
SODIUM: 122 mmol/L — AB (ref 135–145)

## 2015-08-23 LAB — APTT: aPTT: 31 seconds (ref 24–37)

## 2015-08-23 LAB — CBC
HCT: 32.9 % — ABNORMAL LOW (ref 39.0–52.0)
HEMOGLOBIN: 12 g/dL — AB (ref 13.0–17.0)
MCH: 31.4 pg (ref 26.0–34.0)
MCHC: 36.5 g/dL — ABNORMAL HIGH (ref 30.0–36.0)
MCV: 86.1 fL (ref 78.0–100.0)
Platelets: 317 10*3/uL (ref 150–400)
RBC: 3.82 MIL/uL — AB (ref 4.22–5.81)
RDW: 12 % (ref 11.5–15.5)
WBC: 4.8 10*3/uL (ref 4.0–10.5)

## 2015-08-23 LAB — PROTIME-INR
INR: 1 (ref 0.00–1.49)
PROTHROMBIN TIME: 13.4 s (ref 11.6–15.2)

## 2015-08-23 MED ORDER — CEFAZOLIN SODIUM-DEXTROSE 2-3 GM-% IV SOLR: 2.0000 g | INTRAVENOUS | Status: DC

## 2015-08-23 MED ORDER — SODIUM CHLORIDE 0.9 % IV SOLN
Freq: Once | INTRAVENOUS | Status: AC
  Administered 2015-08-23: 08:00:00 via INTRAVENOUS

## 2015-08-23 NOTE — H&P (Signed)
Chief Complaint: Patient was seen in consultation today for port a cath placement Referring Physician(s): Mohamed,Mohamed  History of Present Illness: Spencer Long is a 64 y.o. 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   Past Medical History  Diagnosis Date  . Hypertension     under control, has been on med. x 15-20 yrs.  [name] GERD (gastroesophageal reflux disease)   . Infection of elbow 2011    right-no infection at present , but has skin lesions present  . Sarcoidosis ([facility])     "tends to have skin flareups" and presently experiencing now"feet, legs and arms"  . Chronic hyponatremia     Dr. Buddy Duty follows  . Radiation 02/11/15-02/25/15    brain 25 Gy  . Lung cancer ([facility])   . DNR no code (do not resuscitate) 08/02/2015    Past Surgical History  Procedure Laterality Date  . Cholecystectomy  05/2006  . Ulnar nerve repair  01/2008    right; decompression  . Carpal tunnel release  01/2008    right  . Eye surgery  12/2008    correction ectropion, release band, wedge exc., shortening  . I&d extremity  09/12/2011    Procedure: IRRIGATION AND DEBRIDEMENT EXTREMITY;  Surgeon: Tennis Must;  Location: [facility];  Service: Orthopedics;  Laterality: Right;  I&d RIGHT OLECRANON BURSSA   . Tonsillectomy    . Endobronchial ultrasound Bilateral 08/24/2014    Procedure: ENDOBRONCHIAL ULTRASOUND;  Surgeon: Collene Gobble, MD;  Location: [facility];  Service: Cardiopulmonary;  Laterality: Bilateral;    Allergies: Varenicline; Isothiazolinone chloride; Oxycodone; and Quaternium-15  Medications: Prior to Admission medications   Medication Sig Start Date End Date Taking? Authorizing Provider  Albuterol Sulfate (PROAIR RESPICLICK) 542 (90 BASE) MCG/ACT AEPB Inhale 2 puffs into the lungs every 4 (four) hours as  needed. Patient taking differently: Inhale 2 puffs into the lungs every 4 (four) hours as needed (SOB).  05/21/15  Yes Collene Gobble, MD  amLODipine (NORVASC) 10 MG tablet Take 10 mg by mouth every morning.  07/23/14  Yes Historical Provider, MD  aspirin EC 81 MG tablet Take 81 mg by mouth every morning.    Yes Historical Provider, MD  atenolol (TENORMIN) 25 MG tablet Take 25 mg by mouth 2 (two) times daily.   Yes Historical Provider, MD  Azilsartan Medoxomil (EDARBI) 80 MG TABS Take 1 tablet by mouth every morning.   Yes Historical Provider, MD  buPROPion (WELLBUTRIN SR) 150 MG 12 hr tablet Take 1 tablet (150 mg total) by mouth 2 (two) times daily. 08/11/15  Yes Collene Gobble, MD  diclofenac (VOLTAREN) 75 MG EC tablet Take 75 mg by mouth 2 (two) times daily.   Yes Historical Provider, MD  folic acid (FOLVITE) 1 MG tablet Take 1 mg by mouth 2 (two) times daily. 07/06/14  Yes Historical Provider, MD  loperamide (IMODIUM) 2 MG capsule Take 2 mg by mouth as needed for diarrhea or loose stools.   Yes Historical Provider, MD  Multiple Vitamins-Minerals (MULTIVITAMIN ADULTS 50+ PO) Take 1 tablet by mouth every morning.    Yes Historical Provider, MD  nicotine (NICODERM CQ - DOSED IN MG/24 HOURS) 14 mg/24hr patch Place 1 patch (14 mg total) onto the skin daily. 11/27/14  Yes Collene Gobble, MD  omeprazole (PRILOSEC) 40 MG capsule Take  40 mg by mouth every morning.   Yes Historical Provider, MD  Probiotic Product (PROBIOTIC DAILY PO) Take 1 tablet by mouth every morning.    Yes Historical Provider, MD  sodium chloride 1 G tablet Take 1 g by mouth 3 (three) times daily.   Yes Historical Provider, MD  Tiotropium Bromide Monohydrate (SPIRIVA RESPIMAT) 1.25 MCG/ACT AERS Inhale 2 puffs into the lungs daily.   Yes Historical Provider, MD  white petrolatum (VASELINE) GEL Apply 1 application topically daily as needed for lip care or dry skin.   Yes Historical Provider, MD  dicyclomine (BENTYL) 20 MG tablet Take 20 mg  by mouth every 4 (four) hours as needed for spasms.  08/02/14   Historical Provider, MD  lidocaine-prilocaine (EMLA) cream Apply 1 application topically as needed. Patient not taking: Reported on 08/20/2015 08/17/15   Curt Bears, MD  mupirocin cream (BACTROBAN) 2 % Apply 1 application topically 2 (two) times daily.    Historical Provider, MD  nicotine (NICODERM CQ - DOSED IN MG/24 HR) 7 mg/24hr patch Place 1 patch (7 mg total) onto the skin daily. Patient not taking: Reported on 08/20/2015 07/22/15   Collene Gobble, MD  prochlorperazine (COMPAZINE) 10 MG tablet Take 1 tablet (10 mg total) by mouth every 6 (six) hours as needed for nausea or vomiting. 08/17/15   Curt Bears, MD     Family History  Problem Relation Age of Onset  . Cancer Father     Leukemia  . Cancer Mother     breast  . Dementia Mother     Social History   Social History  . Marital Status: Married    Spouse Name: N/A  . Number of Children: 1  . Years of Education: N/A   Occupational History  . 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 Smoking status: Former Smoker -- 1.50 packs/day for 40 years    Types: Cigarettes  . Smokeless tobacco: Former Systems developer    Quit date: 08/26/2014     Comment: currently using electronic cigarette and nicotine patch  . Alcohol Use: 8.4 oz/week    14 Standard drinks or equivalent per week     Comment: 2-4 drinks daily  . Drug Use: No  . Sexual Activity: Not Asked   Other Topics Concern  . None   Social History Narrative      Review of Systems  Constitutional: Positive for fatigue. Negative for fever and chills.  Respiratory: Negative for cough and shortness of breath.   Cardiovascular: Negative for chest pain.  Gastrointestinal: Negative for nausea, vomiting and abdominal pain.  Genitourinary: Negative for dysuria and hematuria.  Musculoskeletal: Negative for back pain.  Neurological: Positive for weakness. Negative for  headaches.    Vital Signs: BP 155/79 mmHg  Pulse 76  Temp(Src) 98.3 F (36.8 C) (Oral)  Resp 16  SpO2 93%  Physical Exam  Constitutional: He is oriented to person, place, and time. He appears well-developed and well-nourished.  Cardiovascular: Normal rate and regular rhythm.   Pulmonary/Chest: Effort normal.  Few exp wheezes, dim BS on left, right clear  Abdominal: Soft. Bowel sounds are normal. There is no tenderness.  Musculoskeletal: He exhibits no edema.  Neurological: He is alert and oriented to person, place, and time.    Mallampati Score:     Imaging: Ct Chest W Contrast  07/29/2015  CLINICAL DATA:  Patient with history of small cell lung carcinoma diagnosed 08/18/2014. Status post chemotherapy. EXAM: CT  CHEST, ABDOMEN, AND PELVIS WITH CONTRAST TECHNIQUE: Multidetector CT imaging of the chest, abdomen and pelvis was performed following the standard protocol during bolus administration of intravenous contrast. CONTRAST:  179m OMNIPAQUE IOHEXOL 300 MG/ML  SOLN COMPARISON:  CT CAP 04/19/2015 FINDINGS: CT CHEST FINDINGS Mediastinum/Lymph Nodes: Interval increase in size of left infrahilar mass measuring 2.0 x 1.8 cm (image 37; series 2), previously 1.2 x 0.9 cm. Interval increase in size of right hilar lymph node measuring 1.1 cm (image 31; series 2), previously 0.9 cm. The heart is normal in size. Extensive coronary arterial vascular calcifications. Visualized thyroid is unremarkable. No enlarged axillary lymphadenopathy. Lungs/Pleura: Left infrahilar mass exerts mass effect on the left lower lobe bronchi resulting in bandlike consolidation within the left lower lobe which may represent atelectasis or extension of disease. Interval development of multiple small areas of subpleural ground-glass consolidation particularly within the left lower lobe measuring up to 1.7 cm (image 31; series 5). Large area of ground-glass attenuation within the anterior aspect of the left upper lobe (image  20 10-2020). Interval increase in size of small left pleural effusion. No pneumothorax. CT ABDOMEN PELVIS FINDINGS Hepatobiliary: Interval development of 2 low-attenuation lesions within the hepatic dome measuring 1.2 cm and 0.5 cm (image 52; series 2). No additional focal hepatic lesions are identified. The patient is status post cholecystectomy. There is no intrahepatic or extrahepatic biliary ductal dilatation. Pancreas: Unremarkable Spleen: Unremarkable Adrenals/Urinary Tract: The adrenal glands are normal. The kidneys enhance symmetrically with contrast. No hydronephrosis. Urinary bladder is unremarkable. Stomach/Bowel: Sigmoid colonic diverticulosis. No CT evidence for acute diverticulitis. The appendix is normal. Small hiatal hernia. Morphology of the stomach is normal. No free fluid or free intraperitoneal air. Vascular/Lymphatic: Tortuous yet normal caliber abdominal aorta with peripheral calcified atherosclerotic plaque. No retroperitoneal lymphadenopathy. Other: Prostate is unremarkable. Musculoskeletal: No aggressive or acute appearing osseous lesions. Lower lumbar spine degenerative changes. IMPRESSION: Interval increase in size of left infrahilar mass. There is resultant bandlike consolidation within the left lower lobe which may represent atelectasis or extension of disease. Interval increase in size of small left pleural effusion. Interval development of a large area of patchy ground-glass attenuation within the anterior left upper lobe with multiple subpleural areas of ground-glass attenuation predominately within the left lower lobe. Findings may be secondary to an infectious or inflammatory process or potentially drug reaction/post treatment in etiology. Given the subpleural location of the left lower lobe ground-glass foci, pulmonary infarcts are not entirely excluded. Attention on followup as metastatic disease is not excluded. Interval development of 2 low-attenuation lesions within the hepatic  dome which are incompletely characterized however appear new from prior. As these are new, metastatic disease is of concern. These results will be called to the ordering clinician or representative by the Radiologist Assistant, and communication documented in the PACS or zVision Dashboard. Electronically Signed   By: DLovey NewcomerM.D.   On: 07/29/2015 10:28   Mr Spencer Long  08/09/2015  CLINICAL DATA:  64year old hypertensive male with small cell lung cancer diagnosed 08/18/2014. Post chemotherapy. Subsequent encounter. EXAM: MRI HEAD WITHOUT AND WITH CONTRAST TECHNIQUE: Multiplanar, multiecho pulse sequences of the brain and surrounding structures were obtained without and with intravenous contrast. CONTRAST:  192mMULTIHANCE GADOBENATE DIMEGLUMINE 529 MG/ML IV SOLN COMPARISON:  02/03/2015 and 09/10/2014 MR. FINDINGS: New 1.4 x 0.8 x 0.9 cm enhancing lesion inferior aspect of the left cerebellum suggestive of intracranial metastatic disease. No other intracranial enhancing lesion identified. Interval improvement of appearance of the  clivus without destructive lesion identified. No acute infarct. No intracranial hemorrhage. Prominent small vessel disease type changes. Global atrophy without hydrocephalus. Small left vertebral artery may predominantly end in a posterior inferior cerebellar artery distribution without change. Ectatic right vertebral artery and basilar artery. Internal carotid arteries are patent. Opacification mastoid air cells bilaterally without obstructing lesion of the eustachian tube identified. Minimal to mild mucosal thickening paranasal sinuses. No orbital abnormality identified. Cervical medullary junction, pituitary region and pineal region unremarkable. IMPRESSION: New 1.4 x 0.8 x 0.9 cm enhancing lesion inferior aspect of the left cerebellum suggestive of intracranial metastatic disease. No other intracranial enhancing lesion identified. Interval improvement of appearance of  the clivus without destructive lesion identified. Prominent small vessel disease type changes. Global atrophy without hydrocephalus. Opacification mastoid air cells bilaterally. Minimal to mild mucosal thickening paranasal sinuses. These results will be called to the ordering clinician or representative by the Radiologist Assistant, and communication documented in the PACS or zVision Dashboard. Electronically Signed   By: Genia Del M.D.   On: 08/09/2015 08:16   Ct Abdomen Pelvis W Contrast  07/29/2015  CLINICAL DATA:  Patient with history of small cell lung carcinoma diagnosed 08/18/2014. Status post chemotherapy. EXAM: CT CHEST, ABDOMEN, AND PELVIS WITH CONTRAST TECHNIQUE: Multidetector CT imaging of the chest, abdomen and pelvis was performed following the standard protocol during bolus administration of intravenous contrast. CONTRAST:  164m OMNIPAQUE IOHEXOL 300 MG/ML  SOLN COMPARISON:  CT CAP 04/19/2015 FINDINGS: CT CHEST FINDINGS Mediastinum/Lymph Nodes: Interval increase in size of left infrahilar mass measuring 2.0 x 1.8 cm (image 37; series 2), previously 1.2 x 0.9 cm. Interval increase in size of right hilar lymph node measuring 1.1 cm (image 31; series 2), previously 0.9 cm. The heart is normal in size. Extensive coronary arterial vascular calcifications. Visualized thyroid is unremarkable. No enlarged axillary lymphadenopathy. Lungs/Pleura: Left infrahilar mass exerts mass effect on the left lower lobe bronchi resulting in bandlike consolidation within the left lower lobe which may represent atelectasis or extension of disease. Interval development of multiple small areas of subpleural ground-glass consolidation particularly within the left lower lobe measuring up to 1.7 cm (image 31; series 5). Large area of ground-glass attenuation within the anterior aspect of the left upper lobe (image 20 10-2020). Interval increase in size of small left pleural effusion. No pneumothorax. CT ABDOMEN PELVIS  FINDINGS Hepatobiliary: Interval development of 2 low-attenuation lesions within the hepatic dome measuring 1.2 cm and 0.5 cm (image 52; series 2). No additional focal hepatic lesions are identified. The patient is status post cholecystectomy. There is no intrahepatic or extrahepatic biliary ductal dilatation. Pancreas: Unremarkable Spleen: Unremarkable Adrenals/Urinary Tract: The adrenal glands are normal. The kidneys enhance symmetrically with contrast. No hydronephrosis. Urinary bladder is unremarkable. Stomach/Bowel: Sigmoid colonic diverticulosis. No CT evidence for acute diverticulitis. The appendix is normal. Small hiatal hernia. Morphology of the stomach is normal. No free fluid or free intraperitoneal air. Vascular/Lymphatic: Tortuous yet normal caliber abdominal aorta with peripheral calcified atherosclerotic plaque. No retroperitoneal lymphadenopathy. Other: Prostate is unremarkable. Musculoskeletal: No aggressive or acute appearing osseous lesions. Lower lumbar spine degenerative changes. IMPRESSION: Interval increase in size of left infrahilar mass. There is resultant bandlike consolidation within the left lower lobe which may represent atelectasis or extension of disease. Interval increase in size of small left pleural effusion. Interval development of a large area of patchy ground-glass attenuation within the anterior left upper lobe with multiple subpleural areas of ground-glass attenuation predominately within the left lower lobe. Findings may  be secondary to an infectious or inflammatory process or potentially drug reaction/post treatment in etiology. Given the subpleural location of the left lower lobe ground-glass foci, pulmonary infarcts are not entirely excluded. Attention on followup as metastatic disease is not excluded. Interval development of 2 low-attenuation lesions within the hepatic dome which are incompletely characterized however appear new from prior. As these are new, metastatic  disease is of concern. These results will be called to the ordering clinician or representative by the Radiologist Assistant, and communication documented in the PACS or zVision Dashboard. Electronically Signed   By: Lovey Newcomer M.D.   On: 07/29/2015 10:28   Nm Pet Image Restag (ps) Skull Base To Thigh  08/16/2015  CLINICAL DATA:  Subsequent treatment strategy for small cell left lung cancer. EXAM: NUCLEAR MEDICINE PET SKULL BASE TO THIGH TECHNIQUE: 8.0 mCi F-18 FDG was injected intravenously. Full-ring PET imaging was performed from the skull base to thigh after the radiotracer. CT data was obtained and used for attenuation correction and anatomic localization. FASTING BLOOD GLUCOSE:  Value: 100 mg/dl COMPARISON:  MR brain 08/09/2015, CT chest abdomen pelvis 07/29/2015 and PET 09/11/2014. FINDINGS: NECK No hypermetabolic lymph nodes in the neck. CT images show no acute findings. CHEST Left infrahilar mass/adenopathy has an SUV max of 15.6 (lymph node better measured on 07/29/2015) and measures approximately 2.4 cm in short axis (series 6, image 43), previously 2.1 cm. Uptake within the right hilum has an SUV max of 4.1. Subpleural ground-glass in the superior segment left lower lobe (series 6, image 23), shows mild hypermetabolism with an SUV max of 2.7. Subpleural ground-glass seen more inferiorly within the medial left lower lobe (image 42) is not hypermetabolic and is somewhat progressive from 07/29/2015. CT images show three-vessel coronary artery calcification. No pericardial effusion. Trace left pleural effusion. Left infrahilar mass causes obstruction or narrowing of adjacent bronchi. ABDOMEN/PELVIS A new mesenteric nodule is seen in the right lower quadrant, measuring 1.5 cm in long axis (series 4, image 153), with an SUV max of 12.8. No hypermetabolic adenopathy. No abnormal hypermetabolism in the liver, adrenal glands, spleen or pancreas. CT images show low-attenuation lesions in the dome of the  liver, measuring up to 11 mm, stable from 07/29/2015. These are not hypermetabolic but may be too small for PET resolution. Cholecystectomy. Adrenal glands are unremarkable. Stones are seen in the right kidney. Kidneys, spleen, pancreas, stomach and bowel are otherwise grossly unremarkable. Bilateral inguinal hernias contain fat. Atherosclerotic calcification of the arterial vasculature without abdominal aortic aneurysm. SKELETON No abnormal osseous hypermetabolism. Degenerative changes are seen in the spine. IMPRESSION: 1. Enlarging hypermetabolic left infrahilar mass/adenopathy with postobstructive changes in the left lower lobe. 2. Mild hypermetabolism in the right hilum, nonspecific. 3. New hypermetabolic mesenteric metastasis. 4. Low-attenuation lesions in the dome of the liver are unchanged from 07/29/2015 but may be too small for PET resolution. Metastatic disease is not excluded. 5. Trace left pleural effusion. 6. Right renal stones. Electronically Signed   By: Lorin Picket M.D.   On: 08/16/2015 09:26    Labs:  CBC:  Recent Labs  01/11/15 0946 01/18/15 0858 04/12/15 0856 07/29/15 0827  WBC 6.5 8.3 6.1 5.2  HGB 10.6* 12.1* 12.8* 12.7*  HCT 30.6* 34.7* 36.9* 35.8*  PLT 147 250 305 314    COAGS: No results for input(s): INR, APTT in the last 8760 hours.  BMP:  Recent Labs  01/11/15 0946 01/18/15 0858 04/12/15 0856 07/29/15 0827  NA 135* 135* 135* 123*  K  4.9 5.5* 5.3* 5.6*  CO2 '25 25 24 23  '$ GLUCOSE 97 105 88 96  BUN 13.1 12.5 21.8 13.5  CALCIUM 8.7 9.3 9.0 9.5  CREATININE 0.8 0.8 1.0 0.8    LIVER FUNCTION TESTS:  Recent Labs  01/11/15 0946 01/18/15 0858 04/12/15 0856 07/29/15 0827  BILITOT 0.29 0.40 0.45 0.78  AST '14 17 22 15  '$ ALT 17 16 36 18  ALKPHOS 102 93 70 80  PROT 6.0* 6.4 6.3* 6.6  ALBUMIN 3.7 4.0 4.0 4.2    TUMOR MARKERS: No results for input(s): AFPTM, CEA, CA199, CHROMGRNA in the last 8760 hours.  Assessment and Plan: Spencer Long is a  64 y.o. 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 All of the patient's questions were answered, patient is agreeable to proceed. Consent signed and in chart.     Thank you for this interesting consult.  I greatly enjoyed meeting Spencer Long and look forward to participating in their care.  A copy of this report was sent to the requesting provider on this date.  Signed: D. Rowe Robert 08/23/2015, 9:28 AM   I spent a total of 15 minutes in face to face in clinical consultation, greater than 50% of which was counseling/coordinating care for port a cath placement

## 2015-08-23 NOTE — Progress Notes (Signed)
IR unable to place [place] a cath at this time however Mr. Spencer Long was rescheduled for 08/26/15.  He is ok with this and his wife was given instructions with verbal understanding.

## 2015-08-24 ENCOUNTER — Other Ambulatory Visit: Payer: Self-pay | Admitting: *Deleted

## 2015-08-24 ENCOUNTER — Other Ambulatory Visit: Payer: Self-pay | Admitting: Radiology

## 2015-08-24 MED ORDER — BUPROPION HCL ER (SR) 150 MG PO TB12: 150.0000 mg | ORAL_TABLET | Freq: Two times a day (BID) | ORAL | Status: DC

## 2015-08-25 ENCOUNTER — Other Ambulatory Visit: Payer: Self-pay | Admitting: Radiology

## 2015-08-25 ENCOUNTER — Ambulatory Visit
Admission: RE | Admit: 2015-08-25 | Discharge: 2015-08-25 | Disposition: A | Discharge status: Home / Self Care | Payer: Commercial Managed Care - HMO | Source: Ambulatory Visit | Attending: Radiation Oncology | Admitting: Radiation Oncology

## 2015-08-25 ENCOUNTER — Telehealth: Payer: Self-pay | Admitting: Internal Medicine

## 2015-08-25 DIAGNOSIS — C7931 Secondary malignant neoplasm of brain: Secondary | ICD-10-CM

## 2015-08-25 MED ORDER — GADOBENATE DIMEGLUMINE 529 MG/ML IV SOLN
15.0000 mL | Freq: Once | INTRAVENOUS | Status: AC | PRN
  Administered 2015-08-25: 15 mL via INTRAVENOUS

## 2015-08-25 NOTE — Telephone Encounter (Signed)
returned call and s.w. pt and confirmed appt °

## 2015-08-26 ENCOUNTER — Ambulatory Visit (HOSPITAL_COMMUNITY)
Admission: RE | Admit: 2015-08-26 | Discharge: 2015-08-26 | Disposition: A | Discharge status: Home / Self Care | Payer: Commercial Managed Care - HMO | Source: Ambulatory Visit | Attending: Internal Medicine | Admitting: Internal Medicine

## 2015-08-26 ENCOUNTER — Other Ambulatory Visit: Payer: Self-pay | Admitting: Radiology

## 2015-08-26 ENCOUNTER — Other Ambulatory Visit: Payer: Self-pay | Admitting: Internal Medicine

## 2015-08-26 ENCOUNTER — Encounter (HOSPITAL_COMMUNITY): Payer: Self-pay

## 2015-08-26 DIAGNOSIS — C3492 Malignant neoplasm of unspecified part of left bronchus or lung: Secondary | ICD-10-CM

## 2015-08-26 DIAGNOSIS — Z79899 Other long term (current) drug therapy: Secondary | ICD-10-CM | POA: Insufficient documentation

## 2015-08-26 DIAGNOSIS — Z7982 Long term (current) use of aspirin: Secondary | ICD-10-CM | POA: Diagnosis not present

## 2015-08-26 MED ORDER — FENTANYL CITRATE (PF) 100 MCG/2ML IJ SOLN
INTRAMUSCULAR | Status: AC | PRN
  Administered 2015-08-26: 50 ug via INTRAVENOUS

## 2015-08-26 MED ORDER — LIDOCAINE HCL 1 % IJ SOLN
INTRAMUSCULAR | Status: AC
  Filled 2015-08-26: qty 20

## 2015-08-26 MED ORDER — FENTANYL CITRATE (PF) 100 MCG/2ML IJ SOLN
INTRAMUSCULAR | Status: AC
  Filled 2015-08-26: qty 4

## 2015-08-26 MED ORDER — LIDOCAINE-EPINEPHRINE 2 %-1:100000 IJ SOLN
INTRAMUSCULAR | Status: AC
  Filled 2015-08-26: qty 1

## 2015-08-26 MED ORDER — CEFAZOLIN SODIUM-DEXTROSE 2-3 GM-% IV SOLR
INTRAVENOUS | Status: AC
Start: 2015-08-26 — End: 2015-08-26
  Administered 2015-08-26: 2 g via INTRAVENOUS
  Filled 2015-08-26: qty 50

## 2015-08-26 MED ORDER — CEFAZOLIN SODIUM-DEXTROSE 2-3 GM-% IV SOLR
2.0000 g | INTRAVENOUS | Status: AC
  Administered 2015-08-26: 2 g via INTRAVENOUS

## 2015-08-26 MED ORDER — SODIUM CHLORIDE 0.9 % IV SOLN
Freq: Once | INTRAVENOUS | Status: AC
  Administered 2015-08-26: 10:00:00 via INTRAVENOUS

## 2015-08-26 MED ORDER — HEPARIN SOD (PORK) LOCK FLUSH 100 UNIT/ML IV SOLN
INTRAVENOUS | Status: AC
  Filled 2015-08-26: qty 5

## 2015-08-26 MED ORDER — MIDAZOLAM HCL 2 MG/2ML IJ SOLN
INTRAMUSCULAR | Status: AC
  Filled 2015-08-26: qty 6

## 2015-08-26 MED ORDER — MIDAZOLAM HCL 2 MG/2ML IJ SOLN
INTRAMUSCULAR | Status: AC | PRN
  Administered 2015-08-26 (×3): 1 mg via INTRAVENOUS
  Administered 2015-08-26: 0.5 mg via INTRAVENOUS
  Administered 2015-08-26: 1 mg via INTRAVENOUS

## 2015-08-26 MED ORDER — HEPARIN SOD (PORK) LOCK FLUSH 100 UNIT/ML IV SOLN
INTRAVENOUS | Status: AC | PRN
  Administered 2015-08-26: 500 [IU]

## 2015-08-26 NOTE — Discharge Instructions (Signed)
Implanted Port Insertion, Care After °Refer to this sheet in the next few weeks. These instructions provide you with information on caring for yourself after your procedure. Your health care provider may also give you more specific instructions. Your treatment has been planned according to current medical practices, but problems sometimes occur. Call your health care provider if you have any problems or questions after your procedure. °WHAT TO EXPECT AFTER THE PROCEDURE °After your procedure, it is typical to have the following:  °· Discomfort at the port insertion site. Ice packs to the area will help. °· Bruising on the skin over the port. This will subside in 3-4 days. °HOME CARE INSTRUCTIONS °· After your port is placed, you will get a manufacturer's information card. The card has information about your port. Keep this card with you at all times.   °· Know what kind of port you have. There are many types of ports available.   °· Wear a medical alert bracelet in case of an emergency. This can help alert health care workers that you have a port.   °· The port can stay in for as long as your health care provider believes it is necessary.   °· A home health care nurse may give medicines and take care of the port.   °· You or a family member can get special training and directions for giving medicine and taking care of the port at home.   °SEEK MEDICAL CARE IF:  °· Your port does not flush or you are unable to get a blood return.   °· You have a fever or chills. °SEEK IMMEDIATE MEDICAL CARE IF: °· You have new fluid or pus coming from your incision.   °· You notice a bad smell coming from your incision site.   °· You have swelling, pain, or more redness at the incision or port site.   °· You have chest pain or shortness of breath. °  °This information is not intended to replace advice given to you by your health care provider. Make sure you discuss any questions you have with your health care provider. °  °Document  Released: 08/06/2013 Document Revised: 10/21/2013 Document Reviewed: 08/06/2013 °Elsevier Interactive Patient Education ©2016 [employer] °Implanted Port Home Guide °An implanted port is a type of central line that is placed under the skin. Central lines are used to provide IV access when treatment or nutrition needs to be given through a person's veins. Implanted ports are used for long-term IV access. An implanted port may be placed because:  °· You need IV medicine that would be irritating to the small veins in your hands or arms.   °· You need long-term IV medicines, such as antibiotics.   °· You need IV nutrition for a long period.   °· You need frequent blood draws for lab tests.   °· You need dialysis.   °Implanted ports are usually placed in the chest area, but they can also be placed in the upper arm, the abdomen, or the leg. An implanted port has two main parts:  °· Reservoir. The reservoir is round and will appear as a small, raised area under your skin. The reservoir is the part where a needle is inserted to give medicines or draw blood.   °· Catheter. The catheter is a thin, flexible tube that extends from the reservoir. The catheter is placed into a large vein. Medicine that is inserted into the reservoir goes into the catheter and then into the vein.   °HOW WILL I CARE FOR MY INCISION SITE? °Do not get the   incision site wet. Bathe or shower as directed by your health care provider.  °HOW IS MY PORT ACCESSED? °Special steps must be taken to access the port:  °· Before the port is accessed, a numbing cream can be placed on the skin. This helps numb the skin over the port site.   °· Your health care provider uses a sterile technique to access the port. °· Your health care provider must put on a mask and sterile gloves. °· The skin over your port is cleaned carefully with an antiseptic and allowed to dry. °· The port is gently pinched between sterile gloves, and a needle is inserted into the  port. °· Only "non-coring" port needles should be used to access the port. Once the port is accessed, a blood return should be checked. This helps ensure that the port is in the vein and is not clogged.   °· If your port needs to remain accessed for a constant infusion, a clear (transparent) bandage will be placed over the needle site. The bandage and needle will need to be changed every week, or as directed by your health care provider.   °· Keep the bandage covering the needle clean and dry. Do not get it wet. Follow your health care provider's instructions on how to take a shower or bath while the port is accessed.   °· If your port does not need to stay accessed, no bandage is needed over the port.   °WHAT IS FLUSHING? °Flushing helps keep the port from getting clogged. Follow your health care provider's instructions on how and when to flush the port. Ports are usually flushed with saline solution or a medicine called heparin. The need for flushing will depend on how the port is used.  °· If the port is used for intermittent medicines or blood draws, the port will need to be flushed:   °· After medicines have been given.   °· After blood has been drawn.   °· As part of routine maintenance.   °· If a constant infusion is running, the port may not need to be flushed.   °HOW LONG WILL MY PORT STAY IMPLANTED? °The port can stay in for as long as your health care provider thinks it is needed. When it is time for the port to come out, surgery will be done to remove it. The procedure is similar to the one performed when the port was put in.  °WHEN SHOULD I SEEK IMMEDIATE MEDICAL CARE? °When you have an implanted port, you should seek immediate medical care if:  °· You notice a bad smell coming from the incision site.   °· You have swelling, redness, or drainage at the incision site.   °· You have more swelling or pain at the port site or the surrounding area.   °· You have a fever that is not controlled with  medicine. °  °This information is not intended to replace advice given to you by your health care provider. Make sure you discuss any questions you have with your health care provider. °  °Document Released: 10/16/2005 Document Revised: 08/06/2013 Document Reviewed: 06/23/2013 °Elsevier Interactive Patient Education ©2016 [employer] ° ° °Moderate Conscious Sedation, Adult, Care After °Refer to this sheet in the next few weeks. These instructions provide you with information on caring for yourself after your procedure. Your health care provider may also give you more specific instructions. Your treatment has been planned according to current medical practices, but problems sometimes occur. Call your health care provider if you have any problems or questions   after your procedure. °WHAT TO EXPECT AFTER THE PROCEDURE  °After your procedure: °· You may feel sleepy, clumsy, and have poor balance for several hours. °· Vomiting may occur if you eat too soon after the procedure. °HOME CARE INSTRUCTIONS °· Do not participate in any activities where you could become injured for at least 24 hours. Do not: °¨ Drive. °¨ Swim. °¨ Ride a bicycle. °¨ Operate heavy machinery. °¨ Cook. °¨ Use power tools. °¨ Climb ladders. °¨ Work from a high place. °· Do not make important decisions or sign legal documents until you are improved. °· If you vomit, drink water, juice, or soup when you can drink without vomiting. Make sure you have little or no nausea before eating solid foods. °· Only take over-the-counter or prescription medicines for pain, discomfort, or fever as directed by your health care provider. °· Make sure you and your family fully understand everything about the medicines given to you, including what side effects may occur. °· You should not drink alcohol, take sleeping pills, or take medicines that cause drowsiness for at least 24 hours. °· If you smoke, do not smoke without supervision. °· If you are feeling better, you  may resume normal activities 24 hours after you were sedated. °· Keep all appointments with your health care provider. °SEEK MEDICAL CARE IF: °· Your skin is pale or bluish in color. °· You continue to feel nauseous or vomit. °· Your pain is getting worse and is not helped by medicine. °· You have bleeding or swelling. °· You are still sleepy or feeling clumsy after 24 hours. °SEEK IMMEDIATE MEDICAL CARE IF: °· You develop a rash. °· You have difficulty breathing. °· You develop any type of allergic problem. °· You have a fever. °MAKE SURE YOU: °· Understand these instructions. °· Will watch your condition. °· Will get help right away if you are not doing well or get worse. °  °This information is not intended to replace advice given to you by your health care provider. Make sure you discuss any questions you have with your health care provider. °  °Document Released: 08/06/2013 Document Revised: 11/06/2014 Document Reviewed: 08/06/2013 °Elsevier Interactive Patient Education ©2016 [employer] ° °

## 2015-08-26 NOTE — H&P (Signed)
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   The patient has had a H&P performed within the last 30 days, all history, medications, and exam have been reviewed. The patient denies any interval changes since the H&P.  Medications: Prior to Admission medications   Medication Sig Start Date End Date Taking? Authorizing Provider  Albuterol Sulfate (PROAIR RESPICLICK) 950 (90 BASE) MCG/ACT AEPB Inhale 2 puffs into the lungs every 4 (four) hours as needed. Patient taking differently: Inhale 2 puffs into the lungs every 4 (four) hours as needed (SOB).  05/21/15  Yes Collene Gobble, MD  amLODipine (NORVASC) 10 MG tablet Take 10 mg by mouth every morning.  07/23/14  Yes Historical Provider, MD  aspirin EC 81 MG tablet Take 81 mg by mouth every morning.    Yes Historical Provider, MD  atenolol (TENORMIN) 25 MG tablet Take 25 mg by mouth 2 (two) times daily.   Yes Historical Provider, MD  Azilsartan Medoxomil (EDARBI) 80 MG TABS Take 80 mg by mouth daily.    Yes Historical Provider, MD  buPROPion (WELLBUTRIN SR) 150 MG 12 hr tablet Take 1 tablet (150 mg total) by mouth 2 (two) times daily. 08/24/15  Yes Collene Gobble, MD  diclofenac (VOLTAREN) 75 MG EC tablet Take 75 mg by mouth 2 (two) times daily.   Yes Historical Provider, MD  folic acid (FOLVITE) 1 MG tablet Take 1 mg by mouth 2 (two) times daily. 07/06/14  Yes Historical Provider, MD  lidocaine-prilocaine (EMLA) cream Apply 1 application topically as needed. 08/17/15  Yes Curt Bears, MD  loperamide (IMODIUM) 2 MG capsule Take 2 mg by mouth as needed for diarrhea or loose stools.   Yes Historical Provider, MD  Multiple Vitamins-Minerals (MULTIVITAMIN ADULTS 50+ PO) Take 1 tablet by mouth every morning.    Yes Historical Provider, MD  nicotine (NICODERM CQ - DOSED IN MG/24 HOURS) 14  mg/24hr patch Place 1 patch (14 mg total) onto the skin daily. 11/27/14  Yes Collene Gobble, MD  omeprazole (PRILOSEC) 40 MG capsule Take 40 mg by mouth every morning.   Yes Historical Provider, MD  Probiotic Product (PROBIOTIC DAILY PO) Take 1 tablet by mouth every morning.    Yes Historical Provider, MD  prochlorperazine (COMPAZINE) 10 MG tablet Take 1 tablet (10 mg total) by mouth every 6 (six) hours as needed for nausea or vomiting. 08/17/15  Yes Curt Bears, MD  sodium chloride 1 G tablet Take 1 g by mouth 3 (three) times daily.   Yes Historical Provider, MD  Tiotropium Bromide Monohydrate (SPIRIVA RESPIMAT) 1.25 MCG/ACT AERS Inhale 2 puffs into the lungs daily.   Yes Historical Provider, MD  white petrolatum (VASELINE) GEL Apply 1 application topically daily as needed for lip care or dry skin.   Yes Historical Provider, MD  dicyclomine (BENTYL) 20 MG tablet Take 20 mg by mouth every 4 (four) hours as needed for spasms.  08/02/14   Historical Provider, MD  mupirocin cream (BACTROBAN) 2 % Apply 1 application topically 2 (two) times daily.    Historical Provider, MD  nicotine (NICODERM CQ - DOSED IN MG/24 HR) 7 mg/24hr patch Place 1 patch (7 mg total) onto the skin daily. 07/22/15   Collene Gobble, MD     Vital Signs: BP 142/61 mmHg  Pulse 86  Temp(Src) 98.1 F (36.7  C) (Oral)  Resp 16  Ht '5\' 9"'$  (1.753 m)  Wt 164 lb (74.39 kg)  BMI 24.21 kg/m2  SpO2 98%  Physical Exam  Constitutional: He is oriented to person, place, and time. No distress.  HENT:  Head: Normocephalic and atraumatic.  Cardiovascular: Normal rate and regular rhythm.  Exam reveals no gallop and no friction rub.   No murmur heard. Pulmonary/Chest: Effort normal and breath sounds normal. No respiratory distress. He has no wheezes. He has no rales.  Neurological: He is alert and oriented to person, place, and time.  Skin: Skin is warm and dry. He is not diaphoretic.    Mallampati Score:  MD Evaluation Airway:  WNL Heart: WNL Abdomen: WNL Chest/ Lungs: WNL ASA  Classification: 3 Mallampati/Airway Score: Two  Labs:  CBC:  Recent Labs  01/18/15 0858 04/12/15 0856 07/29/15 0827 08/23/15 0815  WBC 8.3 6.1 5.2 4.8  HGB 12.1* 12.8* 12.7* 12.0*  HCT 34.7* 36.9* 35.8* 32.9*  PLT 250 305 314 317    COAGS:  Recent Labs  08/23/15 0815  INR 1.00  APTT 31    BMP:  Recent Labs  01/18/15 0858 04/12/15 0856 07/29/15 0827 08/23/15 0815  NA 135* 135* 123* 122*  K 5.5* 5.3* 5.6* 4.6  CL  --   --   --  88*  CO2 '25 24 23 26  '$ GLUCOSE 105 88 96 105*  BUN 12.5 21.8 13.5 15  CALCIUM 9.3 9.0 9.5 9.0  CREATININE 0.8 1.0 0.8 0.67  GFRNONAA  --   --   --  >60  GFRAA  --   --   --  >60    LIVER FUNCTION TESTS:  Recent Labs  01/11/15 0946 01/18/15 0858 04/12/15 0856 07/29/15 0827  BILITOT 0.29 0.40 0.45 0.78  AST '14 17 22 15  '$ ALT 17 16 36 18  ALKPHOS 102 93 70 80  PROT 6.0* 6.4 6.3* 6.6  ALBUMIN 3.7 4.0 4.0 4.2    Assessment/Plan:  History of 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  The patient has been NPO, no blood thinners taken, labs and vitals have been reviewed. Risks and Benefits discussed with the patient including, but not limited to bleeding, infection, pneumothorax, or fibrin sheath development and need for additional procedures. All of the patient's questions were answered, patient is agreeable to proceed. Consent signed and in chart.   SignedHedy Jacob 08/26/2015, 10:06 AM

## 2015-08-26 NOTE — Procedures (Signed)
Successful RT IJ POWER PORT NO COMP STABLE Tip svc/ra Ready for use Full report in PACS

## 2015-08-27 ENCOUNTER — Ambulatory Visit
Admission: RE | Admit: 2015-08-27 | Discharge: 2015-08-27 | Disposition: A | Discharge status: Home / Self Care | Payer: Commercial Managed Care - HMO | Source: Ambulatory Visit | Attending: Radiation Oncology | Admitting: Radiation Oncology

## 2015-08-27 VITALS — BP 127/66 | HR 73 | Temp 98.4°F | Resp 16 | Wt 160.5 lb

## 2015-08-27 DIAGNOSIS — C7931 Secondary malignant neoplasm of brain: Secondary | ICD-10-CM

## 2015-08-27 DIAGNOSIS — Z510 Encounter for antineoplastic radiation therapy: Secondary | ICD-10-CM | POA: Diagnosis not present

## 2015-08-27 DIAGNOSIS — C3490 Malignant neoplasm of unspecified part of unspecified bronchus or lung: Secondary | ICD-10-CM

## 2015-08-27 MED ORDER — HEPARIN SOD (PORK) LOCK FLUSH 100 UNIT/ML IV SOLN: 500.0000 [IU] | Freq: Once | INTRAVENOUS | Status: DC

## 2015-08-27 MED ORDER — SODIUM CHLORIDE 0.9 % IJ SOLN
10.0000 mL | Freq: Once | INTRAMUSCULAR | Status: AC
  Administered 2015-08-27: 10 mL via INTRAVENOUS

## 2015-08-27 NOTE — Progress Notes (Addendum)
Patient gave name and dob as identifdication,  Had right power port acath placed yesterday morning, removed opsite and gause, slight swelling, emla cream applied over site, no pain, nausea or blurry vision stated, will wait 30 minutes before accessing, labs 08/23/15:BUN=15, Cr=0.67, not diabetic not allergic to IV dye, unable to acces port cath,too swollen, will access Left AC #22g catheter   X 1By Patric Dykes, RN,  Ferdinand Lango, RT pt ready

## 2015-08-31 ENCOUNTER — Ambulatory Visit: Payer: Commercial Managed Care - HMO | Admitting: Physician Assistant

## 2015-08-31 ENCOUNTER — Ambulatory Visit: Payer: Commercial Managed Care - HMO

## 2015-08-31 ENCOUNTER — Other Ambulatory Visit: Payer: Commercial Managed Care - HMO

## 2015-09-01 ENCOUNTER — Ambulatory Visit: Payer: Commercial Managed Care - HMO

## 2015-09-02 ENCOUNTER — Ambulatory Visit: Payer: Commercial Managed Care - HMO

## 2015-09-02 DIAGNOSIS — Z510 Encounter for antineoplastic radiation therapy: Secondary | ICD-10-CM | POA: Diagnosis not present

## 2015-09-03 ENCOUNTER — Encounter: Payer: Self-pay | Admitting: Radiation Oncology

## 2015-09-03 ENCOUNTER — Ambulatory Visit
Admission: RE | Admit: 2015-09-03 | Discharge: 2015-09-03 | Disposition: A | Discharge status: Home / Self Care | Payer: Commercial Managed Care - HMO | Source: Ambulatory Visit | Attending: Radiation Oncology | Admitting: Radiation Oncology

## 2015-09-03 VITALS — BP 125/69 | HR 69 | Temp 97.7°F

## 2015-09-03 DIAGNOSIS — C7931 Secondary malignant neoplasm of brain: Secondary | ICD-10-CM

## 2015-09-03 NOTE — Progress Notes (Signed)
Patient ID: [name], male   DOB: [birth year], 64 y.o.   MRN: [id number]  Name: [name]  MRN: [id number]  Date: 09/03/2015   DOB: 21-Apr-1951  Stereotactic Radiosurgery Operative Note  PRE-OPERATIVE DIAGNOSIS:  Multiple Brain Metastases  POST-OPERATIVE DIAGNOSIS:  Multiple Brain Metastases  PROCEDURE:  Stereotactic Radiosurgery  SURGEON:  Eustace Moore, MD  NARRATIVE: The patient underwent a radiation treatment planning session in the radiation oncology simulation suite under the care of the radiation oncology physician and physicist.  I participated closely in the radiation treatment planning afterwards. The patient underwent planning CT which was fused to 3T high resolution MRI with 1 mm axial slices.  These images were fused on the planning system.  We contoured the gross target volumes and subsequently expanded this to yield the Planning Target Volume. I actively participated in the planning process.  I helped to define and review the target contours and also the contours of the optic pathway, eyes, brainstem and selected nearby organs at risk.  All the dose constraints for critical structures were reviewed and compared to [employer]APM Task Group 101.  The prescription dose conformity was reviewed.  I approved the plan electronically.    Accordingly, [name] was brought to the TrueBeam stereotactic radiation treatment linac and placed in the custom immobilization mask.  The patient was aligned according to the IR fiducial markers with BrainLab Exactrac, then orthogonal x-rays were used in ExacTrac with the 6DOF robotic table and the shifts were made to align the patient  [name] received stereotactic radiosurgery uneventfully.    Lesions treated:  3   Complex lesions treated:  0 (>3.5 cm, <80m of optic path, or within the brainstem)   The detailed description of the procedure is recorded in the radiation oncology procedure note.  I was present for the duration of  the procedure.  DISPOSITION:  Following delivery, the patient was transported to nursing in stable condition and monitored for possible acute effects to be discharged to home in stable condition with follow-up in one month.  JEustace Moore MD 09/03/2015 2:08 PM

## 2015-09-03 NOTE — Progress Notes (Signed)
Spencer Long denies pain, headache, nausea and vision changes.  He reports feeling a little dizzy.  His vitals are within normal limits.  He was escorted out of the clinic in a wheelchair by his wife.

## 2015-09-06 ENCOUNTER — Emergency Department (HOSPITAL_COMMUNITY): Payer: Commercial Managed Care - HMO

## 2015-09-06 ENCOUNTER — Encounter (HOSPITAL_COMMUNITY): Payer: Self-pay | Admitting: Emergency Medicine

## 2015-09-06 ENCOUNTER — Inpatient Hospital Stay (HOSPITAL_COMMUNITY)
Admission: EM | Admit: 2015-09-06 | Discharge: 2015-09-15 | DRG: 640 | Disposition: A | Discharge status: Skilled Nursing Facility | Payer: Commercial Managed Care - HMO | Attending: Internal Medicine | Admitting: Internal Medicine

## 2015-09-06 ENCOUNTER — Telehealth: Payer: Self-pay | Admitting: *Deleted

## 2015-09-06 DIAGNOSIS — Z7982 Long term (current) use of aspirin: Secondary | ICD-10-CM | POA: Diagnosis not present

## 2015-09-06 DIAGNOSIS — J449 Chronic obstructive pulmonary disease, unspecified: Secondary | ICD-10-CM | POA: Diagnosis present

## 2015-09-06 DIAGNOSIS — Z806 Family history of leukemia: Secondary | ICD-10-CM | POA: Diagnosis not present

## 2015-09-06 DIAGNOSIS — C3490 Malignant neoplasm of unspecified part of unspecified bronchus or lung: Secondary | ICD-10-CM | POA: Diagnosis present

## 2015-09-06 DIAGNOSIS — Z888 Allergy status to other drugs, medicaments and biological substances status: Secondary | ICD-10-CM

## 2015-09-06 DIAGNOSIS — F329 Major depressive disorder, single episode, unspecified: Secondary | ICD-10-CM | POA: Diagnosis present

## 2015-09-06 DIAGNOSIS — I10 Essential (primary) hypertension: Secondary | ICD-10-CM | POA: Diagnosis present

## 2015-09-06 DIAGNOSIS — Z885 Allergy status to narcotic agent status: Secondary | ICD-10-CM | POA: Diagnosis not present

## 2015-09-06 DIAGNOSIS — E871 Hypo-osmolality and hyponatremia: Secondary | ICD-10-CM | POA: Diagnosis present

## 2015-09-06 DIAGNOSIS — G9340 Encephalopathy, unspecified: Secondary | ICD-10-CM | POA: Diagnosis present

## 2015-09-06 DIAGNOSIS — Z79899 Other long term (current) drug therapy: Secondary | ICD-10-CM | POA: Diagnosis not present

## 2015-09-06 DIAGNOSIS — Z9221 Personal history of antineoplastic chemotherapy: Secondary | ICD-10-CM | POA: Diagnosis not present

## 2015-09-06 DIAGNOSIS — Z515 Encounter for palliative care: Secondary | ICD-10-CM | POA: Diagnosis present

## 2015-09-06 DIAGNOSIS — F32A Depression, unspecified: Secondary | ICD-10-CM | POA: Diagnosis present

## 2015-09-06 DIAGNOSIS — Z85118 Personal history of other malignant neoplasm of bronchus and lung: Secondary | ICD-10-CM | POA: Diagnosis not present

## 2015-09-06 DIAGNOSIS — Z66 Do not resuscitate: Secondary | ICD-10-CM | POA: Diagnosis present

## 2015-09-06 DIAGNOSIS — G936 Cerebral edema: Secondary | ICD-10-CM | POA: Diagnosis present

## 2015-09-06 DIAGNOSIS — D869 Sarcoidosis, unspecified: Secondary | ICD-10-CM | POA: Diagnosis present

## 2015-09-06 DIAGNOSIS — R451 Restlessness and agitation: Secondary | ICD-10-CM | POA: Diagnosis not present

## 2015-09-06 DIAGNOSIS — K219 Gastro-esophageal reflux disease without esophagitis: Secondary | ICD-10-CM | POA: Diagnosis present

## 2015-09-06 DIAGNOSIS — C7931 Secondary malignant neoplasm of brain: Secondary | ICD-10-CM | POA: Diagnosis present

## 2015-09-06 DIAGNOSIS — R4182 Altered mental status, unspecified: Secondary | ICD-10-CM | POA: Diagnosis not present

## 2015-09-06 LAB — URINALYSIS, ROUTINE W REFLEX MICROSCOPIC
Bilirubin Urine: NEGATIVE
GLUCOSE, UA: NEGATIVE mg/dL
KETONES UR: NEGATIVE mg/dL
LEUKOCYTES UA: NEGATIVE
NITRITE: NEGATIVE
PROTEIN: NEGATIVE mg/dL
Specific Gravity, Urine: 1.016 (ref 1.005–1.030)
UROBILINOGEN UA: 1 mg/dL (ref 0.0–1.0)
pH: 6.5 (ref 5.0–8.0)

## 2015-09-06 LAB — CBC WITH DIFFERENTIAL/PLATELET
BASOS ABS: 0 10*3/uL (ref 0.0–0.1)
BASOS PCT: 0 %
Eosinophils Absolute: 0 10*3/uL (ref 0.0–0.7)
Eosinophils Relative: 0 %
HEMATOCRIT: 33.9 % — AB (ref 39.0–52.0)
HEMOGLOBIN: 12.6 g/dL — AB (ref 13.0–17.0)
Lymphocytes Relative: 6 %
Lymphs Abs: 0.4 10*3/uL — ABNORMAL LOW (ref 0.7–4.0)
MCH: 31 pg (ref 26.0–34.0)
MCHC: 37.2 g/dL — AB (ref 30.0–36.0)
MCV: 83.3 fL (ref 78.0–100.0)
MONOS PCT: 12 %
Monocytes Absolute: 0.8 10*3/uL (ref 0.1–1.0)
NEUTROS ABS: 5.3 10*3/uL (ref 1.7–7.7)
NEUTROS PCT: 82 %
Platelets: 275 10*3/uL (ref 150–400)
RBC: 4.07 MIL/uL — ABNORMAL LOW (ref 4.22–5.81)
RDW: 12.1 % (ref 11.5–15.5)
WBC: 6.5 10*3/uL (ref 4.0–10.5)

## 2015-09-06 LAB — COMPREHENSIVE METABOLIC PANEL
ALBUMIN: 4.3 g/dL (ref 3.5–5.0)
ALT: 26 U/L (ref 17–63)
ANION GAP: 10 (ref 5–15)
AST: 37 U/L (ref 15–41)
Alkaline Phosphatase: 87 U/L (ref 38–126)
BILIRUBIN TOTAL: 1.1 mg/dL (ref 0.3–1.2)
BUN: 14 mg/dL (ref 6–20)
CO2: 24 mmol/L (ref 22–32)
Calcium: 9.2 mg/dL (ref 8.9–10.3)
Chloride: 85 mmol/L — ABNORMAL LOW (ref 101–111)
Creatinine, Ser: 0.88 mg/dL (ref 0.61–1.24)
Glucose, Bld: 121 mg/dL — ABNORMAL HIGH (ref 65–99)
POTASSIUM: 4.5 mmol/L (ref 3.5–5.1)
SODIUM: 119 mmol/L — AB (ref 135–145)
TOTAL PROTEIN: 6.7 g/dL (ref 6.5–8.1)

## 2015-09-06 LAB — URINE MICROSCOPIC-ADD ON

## 2015-09-06 LAB — CBG MONITORING, ED: GLUCOSE-CAPILLARY: 111 mg/dL — AB (ref 65–99)

## 2015-09-06 LAB — I-STAT CG4 LACTIC ACID, ED: Lactic Acid, Venous: 1.34 mmol/L (ref 0.5–2.0)

## 2015-09-06 LAB — I-STAT TROPONIN, ED: Troponin i, poc: 0 ng/mL (ref 0.00–0.08)

## 2015-09-06 MED ORDER — DEXAMETHASONE SODIUM PHOSPHATE 4 MG/ML IJ SOLN
4.0000 mg | Freq: Four times a day (QID) | INTRAMUSCULAR | Status: DC
  Administered 2015-09-06 – 2015-09-15 (×34): 4 mg via INTRAVENOUS
  Filled 2015-09-06 (×35): qty 1

## 2015-09-06 MED ORDER — TIOTROPIUM BROMIDE MONOHYDRATE 18 MCG IN CAPS
18.0000 ug | ORAL_CAPSULE | Freq: Every day | RESPIRATORY_TRACT | Status: DC
  Filled 2015-09-06: qty 5

## 2015-09-06 MED ORDER — IRBESARTAN 150 MG PO TABS
300.0000 mg | ORAL_TABLET | Freq: Every day | ORAL | Status: DC
  Administered 2015-09-07 – 2015-09-13 (×7): 300 mg via ORAL
  Filled 2015-09-06 (×7): qty 2

## 2015-09-06 MED ORDER — ASPIRIN EC 81 MG PO TBEC
81.0000 mg | DELAYED_RELEASE_TABLET | Freq: Every day | ORAL | Status: DC
  Administered 2015-09-07 – 2015-09-15 (×9): 81 mg via ORAL
  Filled 2015-09-06 (×9): qty 1

## 2015-09-06 MED ORDER — DEMECLOCYCLINE HCL 150 MG PO TABS
150.0000 mg | ORAL_TABLET | Freq: Four times a day (QID) | ORAL | Status: DC
  Administered 2015-09-07 – 2015-09-14 (×29): 150 mg via ORAL
  Filled 2015-09-06 (×36): qty 1

## 2015-09-06 MED ORDER — AMLODIPINE BESYLATE 10 MG PO TABS
10.0000 mg | ORAL_TABLET | Freq: Every day | ORAL | Status: DC
  Administered 2015-09-07 – 2015-09-15 (×9): 10 mg via ORAL
  Filled 2015-09-06 (×10): qty 1

## 2015-09-06 MED ORDER — DEXAMETHASONE SODIUM PHOSPHATE 4 MG/ML IJ SOLN
8.0000 mg | Freq: Once | INTRAMUSCULAR | Status: AC
  Administered 2015-09-06: 4 mg via INTRAVENOUS
  Filled 2015-09-06: qty 2

## 2015-09-06 MED ORDER — ACETAMINOPHEN 650 MG RE SUPP
650.0000 mg | Freq: Four times a day (QID) | RECTAL | Status: DC | PRN
  Administered 2015-09-06: 650 mg via RECTAL
  Filled 2015-09-06: qty 1

## 2015-09-06 MED ORDER — POLYETHYLENE GLYCOL 3350 17 G PO PACK: 17.0000 g | PACK | Freq: Every day | ORAL | Status: DC | PRN

## 2015-09-06 MED ORDER — ONDANSETRON HCL 4 MG/2ML IJ SOLN: 4.0000 mg | Freq: Four times a day (QID) | INTRAMUSCULAR | Status: DC | PRN

## 2015-09-06 MED ORDER — SODIUM CHLORIDE 1 G PO TABS
1.0000 g | ORAL_TABLET | Freq: Three times a day (TID) | ORAL | Status: DC
  Administered 2015-09-07 – 2015-09-14 (×22): 1 g via ORAL
  Filled 2015-09-06 (×22): qty 1

## 2015-09-06 MED ORDER — FOLIC ACID 1 MG PO TABS
1.0000 mg | ORAL_TABLET | Freq: Two times a day (BID) | ORAL | Status: DC
  Administered 2015-09-07 – 2015-09-15 (×17): 1 mg via ORAL
  Filled 2015-09-06 (×17): qty 1

## 2015-09-06 MED ORDER — MULTIVITAMIN ADULTS 50+ PO TABS: ORAL_TABLET | ORAL | Status: DC

## 2015-09-06 MED ORDER — DICLOFENAC SODIUM 75 MG PO TBEC
75.0000 mg | DELAYED_RELEASE_TABLET | Freq: Two times a day (BID) | ORAL | Status: DC
  Administered 2015-09-07 – 2015-09-15 (×16): 75 mg via ORAL
  Filled 2015-09-06 (×20): qty 1

## 2015-09-06 MED ORDER — BUPROPION HCL ER (SR) 150 MG PO TB12
150.0000 mg | ORAL_TABLET | Freq: Two times a day (BID) | ORAL | Status: DC
  Administered 2015-09-07 – 2015-09-15 (×17): 150 mg via ORAL
  Filled 2015-09-06 (×20): qty 1

## 2015-09-06 MED ORDER — ACETAMINOPHEN 325 MG PO TABS: 650.0000 mg | ORAL_TABLET | Freq: Four times a day (QID) | ORAL | Status: DC | PRN

## 2015-09-06 MED ORDER — ADULT MULTIVITAMIN W/MINERALS CH
1.0000 | ORAL_TABLET | Freq: Every day | ORAL | Status: DC
  Administered 2015-09-07 – 2015-09-15 (×9): 1 via ORAL
  Filled 2015-09-06 (×9): qty 1

## 2015-09-06 MED ORDER — ENOXAPARIN SODIUM 40 MG/0.4ML ~~LOC~~ SOLN
40.0000 mg | SUBCUTANEOUS | Status: DC
  Administered 2015-09-06 – 2015-09-14 (×9): 40 mg via SUBCUTANEOUS
  Filled 2015-09-06 (×10): qty 0.4

## 2015-09-06 MED ORDER — SODIUM CHLORIDE 0.9 % IV SOLN
Freq: Once | INTRAVENOUS | Status: AC
  Administered 2015-09-06: 10:00:00 via INTRAVENOUS

## 2015-09-06 MED ORDER — TIOTROPIUM BROMIDE MONOHYDRATE 1.25 MCG/ACT IN AERS: 2.0000 | INHALATION_SPRAY | Freq: Every day | RESPIRATORY_TRACT | Status: DC

## 2015-09-06 MED ORDER — SODIUM CHLORIDE 0.9 % IV SOLN: INTRAVENOUS | Status: DC

## 2015-09-06 MED ORDER — MORPHINE SULFATE (PF) 2 MG/ML IV SOLN
1.0000 mg | Freq: Once | INTRAVENOUS | Status: AC
  Administered 2015-09-06: 1 mg via INTRAVENOUS
  Filled 2015-09-06: qty 1

## 2015-09-06 MED ORDER — SODIUM CHLORIDE 0.9 % IV SOLN
INTRAVENOUS | Status: DC
  Administered 2015-09-06 – 2015-09-10 (×10): via INTRAVENOUS
  Administered 2015-09-11: 100 mL/h via INTRAVENOUS

## 2015-09-06 MED ORDER — SODIUM CHLORIDE 0.9 % IJ SOLN
3.0000 mL | Freq: Two times a day (BID) | INTRAMUSCULAR | Status: DC
  Administered 2015-09-06 – 2015-09-14 (×4): 3 mL via INTRAVENOUS

## 2015-09-06 MED ORDER — NICOTINE 14 MG/24HR TD PT24
14.0000 mg | MEDICATED_PATCH | Freq: Every day | TRANSDERMAL | Status: DC
  Administered 2015-09-07 – 2015-09-15 (×9): 14 mg via TRANSDERMAL
  Filled 2015-09-06 (×9): qty 1

## 2015-09-06 MED ORDER — ONDANSETRON HCL 4 MG PO TABS: 4.0000 mg | ORAL_TABLET | Freq: Four times a day (QID) | ORAL | Status: DC | PRN

## 2015-09-06 MED ORDER — DICYCLOMINE HCL 20 MG PO TABS
20.0000 mg | ORAL_TABLET | ORAL | Status: DC | PRN
  Filled 2015-09-06: qty 1

## 2015-09-06 MED ORDER — PANTOPRAZOLE SODIUM 40 MG PO TBEC
40.0000 mg | DELAYED_RELEASE_TABLET | Freq: Every day | ORAL | Status: DC
  Administered 2015-09-07 – 2015-09-15 (×9): 40 mg via ORAL
  Filled 2015-09-06 (×9): qty 1

## 2015-09-06 MED ORDER — ATENOLOL 25 MG PO TABS
25.0000 mg | ORAL_TABLET | Freq: Two times a day (BID) | ORAL | Status: DC
  Administered 2015-09-07 – 2015-09-15 (×17): 25 mg via ORAL
  Filled 2015-09-06 (×17): qty 1

## 2015-09-06 NOTE — Telephone Encounter (Signed)
Voicemail from spouse [name] reporting Kanin's to begin chemotherapy tomorrow but has gone to ER and will be admitted.  Asked what to do about chemotherapy treatments.  Called [phone] to learn he's just arrived to room 1430-01 with 0.9% NS at 125 ml/hr due to low Na+ = 119.  He was unable to talk to her with shaking before arrived to ED.  Advised she call office to notify us when discharged for future scheduling.  This nurse will notify Dr. [name] of today's admission.

## 2015-09-06 NOTE — H&P (Signed)
History and Physical:    Spencer Long   EQA:[id number] DOB: April 22, 1951 DOA: 09/06/2015  Referring MD/provider: Forde Dandy, MD PCP: [name], MD  Oncologist: Curt Bears, MD  Chief Complaint: Altered mental status  History of Present Illness:   Spencer Long is an 64 y.o. male with a PMH of small cell lung cancer with brain metastasis, status post stereotactic radiation therapy under the care of Dr. [name] and chronic hyponatremia on salt tablets, compliant with same, was brought to the hospital via EMS secondary to altered mental status.  The history is entirely obtained from the patient's wife, as the patient is unable to answer any questions. Patient's wife heard him call out this morning, and found him in the bathroom with his pants on trying to urinate. He was disoriented and unable to answer her questions.  EMS was called and he was brought to the hospital for further evaluation where was found to have a sodium of 119. Dr. [name] was contacted about the patient's admission, with plans to see him 09/07/15 and initiate chemotherapy.  ROS:   Review of Systems  Constitutional: Positive for chills. Negative for fever, weight loss, malaise/fatigue and diaphoresis.       Had an episode of rigors this morning.  HENT: Positive for hearing loss. Negative for congestion and sore throat.   Eyes: Negative.   Respiratory: Negative for cough, hemoptysis, sputum production, shortness of breath and wheezing.   Cardiovascular: Negative for chest pain, palpitations, orthopnea and leg swelling.  Gastrointestinal: Positive for constipation. Negative for heartburn, nausea, vomiting, abdominal pain, diarrhea, blood in stool and melena.       H/O IBS  Genitourinary: Negative.        Urinary incontinence today, but typically not a problem.  Musculoskeletal: Positive for joint pain and falls.       Fell 2 weeks ago.  Knee pain chronically.  Skin:       Skin very sensitive.    Neurological: Positive for weakness. Negative for dizziness, seizures, loss of consciousness and headaches.  Endo/Heme/Allergies: Positive for environmental allergies. Negative for polydipsia. Bruises/bleeds easily.       Allergic to hand alcohol gel.  Psychiatric/Behavioral: Negative for depression. The patient is nervous/anxious.      Past Medical History:   Past Medical History  Diagnosis Date  . Hypertension     under control, has been on med. x 15-20 yrs.  [name] GERD (gastroesophageal reflux disease)   . Infection of elbow 2011    right-no infection at present , but has skin lesions present  . Sarcoidosis ([place])     "tends to have skin flareups" and presently experiencing now"feet, legs and arms"  . Chronic hyponatremia     Dr. Buddy Duty follows  . Radiation 02/11/15-02/25/15    brain 25 Gy  . DNR no code (do not resuscitate) 08/02/2015  . Lung cancer [facility])     Past Surgical History:   Past Surgical History  Procedure Laterality Date  . Cholecystectomy  05/2006  . Ulnar nerve repair  01/2008    right; decompression  . Carpal tunnel release  01/2008    right  . Eye surgery  12/2008    correction ectropion, release band, wedge exc., shortening  . I&d extremity  09/12/2011    Procedure: IRRIGATION AND DEBRIDEMENT EXTREMITY;  Surgeon: Tennis Must;  Location: [place];  Service: Orthopedics;  Laterality: Right;  I&d RIGHT OLECRANON BURSSA   . Tonsillectomy    .  Endobronchial ultrasound Bilateral 08/24/2014    Procedure: ENDOBRONCHIAL ULTRASOUND;  Surgeon: Collene Gobble, MD;  Location: [facility];  Service: Cardiopulmonary;  Laterality: Bilateral;    Social History:   Social History   Social History  . Marital Status: Married    Spouse Name: N/A  . Number of Children: 1  . Years of Education: N/A   Occupational History  . 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 Smoking status: Former Smoker -- 1.50 packs/day  for 40 years    Types: Cigarettes  . Smokeless tobacco: Former Systems developer    Quit date: 08/26/2014     Comment: currently using electronic cigarette and nicotine patch  . Alcohol Use: 8.4 oz/week    14 Standard drinks or equivalent per week     Comment: 2-4 drinks daily  . Drug Use: No  . Sexual Activity: Not on file   Other Topics Concern  . Not on file   Social History Narrative   Lives with wife.    Family history:   Family History  Problem Relation Age of Onset  . Cancer Father     Leukemia  . Cancer Mother     breast  . Dementia Mother     Allergies   Varenicline; Isothiazolinone chloride; Oxycodone; and Quaternium-15  Current Medications:   Prior to Admission medications   Medication Sig Start Date End Date Taking? Authorizing Provider  amLODipine (NORVASC) 10 MG tablet Take 10 mg by mouth every morning.  07/23/14  Yes Historical Provider, MD  aspirin EC 81 MG tablet Take 81 mg by mouth every morning.    Yes Historical Provider, MD  atenolol (TENORMIN) 25 MG tablet Take 25 mg by mouth 2 (two) times daily.   Yes Historical Provider, MD  Azilsartan Medoxomil (EDARBI) 80 MG TABS Take 80 mg by mouth daily.    Yes Historical Provider, MD  buPROPion (WELLBUTRIN SR) 150 MG 12 hr tablet Take 1 tablet (150 mg total) by mouth 2 (two) times daily. 08/24/15  Yes Collene Gobble, MD  diclofenac (VOLTAREN) 75 MG EC tablet Take 75 mg by mouth 2 (two) times daily.   Yes Historical Provider, MD  dicyclomine (BENTYL) 20 MG tablet Take 20 mg by mouth every 4 (four) hours as needed for spasms.  08/02/14  Yes Historical Provider, MD  folic acid (FOLVITE) 1 MG tablet Take 1 mg by mouth 2 (two) times daily. 07/06/14  Yes Historical Provider, MD  lidocaine-prilocaine (EMLA) cream Apply 1 application topically as needed. 08/17/15  Yes Curt Bears, MD  loperamide (IMODIUM) 2 MG capsule Take 2 mg by mouth as needed for diarrhea or loose stools.   Yes Historical Provider, MD  Multiple  Vitamins-Minerals (MULTIVITAMIN ADULTS 50+ PO) Take 1 tablet by mouth every morning.    Yes Historical Provider, MD  mupirocin cream (BACTROBAN) 2 % Apply 1 application topically 2 (two) times daily.   Yes Historical Provider, MD  nicotine (NICODERM CQ - DOSED IN MG/24 HOURS) 14 mg/24hr patch Place 1 patch (14 mg total) onto the skin daily. 11/27/14  Yes Collene Gobble, MD  omeprazole (PRILOSEC) 40 MG capsule Take 40 mg by mouth every morning.   Yes Historical Provider, MD  Probiotic Product (PROBIOTIC DAILY PO) Take 1 tablet by mouth every morning.    Yes Historical Provider, MD  sodium chloride 1 G tablet Take 1 g by mouth 3 (three) times daily.   Yes Historical Provider, MD  Tiotropium  Bromide Monohydrate (SPIRIVA RESPIMAT) 1.25 MCG/ACT AERS Inhale 2 puffs into the lungs daily.   Yes Historical Provider, MD  white petrolatum (VASELINE) GEL Apply 1 application topically daily as needed for lip care or dry skin.   Yes Historical Provider, MD  Albuterol Sulfate (PROAIR RESPICLICK) 474 (90 BASE) MCG/ACT AEPB Inhale 2 puffs into the lungs every 4 (four) hours as needed. Patient not taking: Reported on 09/06/2015 05/21/15   Collene Gobble, MD  nicotine (NICODERM CQ - DOSED IN MG/24 HR) 7 mg/24hr patch Place 1 patch (7 mg total) onto the skin daily. Patient not taking: Reported on 09/06/2015 07/22/15   Collene Gobble, MD  prochlorperazine (COMPAZINE) 10 MG tablet Take 1 tablet (10 mg total) by mouth every 6 (six) hours as needed for nausea or vomiting. Patient not taking: Reported on 09/06/2015 08/17/15   Curt Bears, MD    Physical Exam:   Filed Vitals:   09/06/15 0755 09/06/15 0915 09/06/15 0945 09/06/15 1109  BP: 133/57 139/71 167/71 162/71  Pulse: 87 101 92 102  Temp: 97 F (36.1 C)   100.9 F (38.3 C)  TempSrc: Rectal   Oral  Resp: '24 17 16 15  '$ Height:    '5\' 9"'$  (1.753 m)  Weight:    72 kg (158 lb 11.7 oz)  SpO2: 95% 96% 94% 99%     Physical Exam: Blood pressure 162/71, pulse 102,  temperature 100.9 F (38.3 C), temperature source Oral, resp. rate 15, height '5\' 9"'$  (1.753 m), weight 72 kg (158 lb 11.7 oz), SpO2 99 %. Gen: Restless and agitated. Head: Normocephalic, atraumatic. Eyes: PERRL, EOMI, sclerae nonicteric. Mouth: Oropharynx clear. Dry mucous membranes. Neck: Supple, no thyromegaly, no lymphadenopathy, no jugular venous distention. Chest: Lungs clear to auscultation bilaterally. CV: Heart sounds are regular. No murmurs, rubs, or gallops. Abdomen: Soft, nontender, nondistended with normal active bowel sounds. Extremities: Extremities are without clubbing, edema, or cyanosis. Skin: Warm and dry. Neuro: Disoriented/confused/agitated, unable to follow commands. Moves all extremities 4 spontaneously. Psych: Mood and affect anxious.   Data Review:    Labs: Basic Metabolic Panel:  Recent Labs Lab 09/06/15 0817  NA 119*  K 4.5  CL 85*  CO2 24  GLUCOSE 121*  BUN 14  CREATININE 0.88  CALCIUM 9.2   Liver Function Tests:  Recent Labs Lab 09/06/15 0817  AST 37  ALT 26  ALKPHOS 87  BILITOT 1.1  PROT 6.7  ALBUMIN 4.3   CBC:  Recent Labs Lab 09/06/15 0817  WBC 6.5  NEUTROABS 5.3  HGB 12.6*  HCT 33.9*  MCV 83.3  PLT 275   CBG:  Recent Labs Lab 09/06/15 0753  GLUCAP 111*    Radiographic Studies: Dg Chest 1 View  09/06/2015  CLINICAL DATA:  64 year old male with new altered mental status. Lung cancer with recently diagnosed metastatic disease to the brain. Subsequent encounter. EXAM: CHEST 1 VIEW COMPARISON:  PET-CT 08/16/2015, and earlier. FINDINGS: Right chest porta cath now in place. Stable cardiac size and mediastinal contours. Stable lung volumes. No pneumothorax, pulmonary edema, pleural effusion. Continued streaky retrocardiac opacity. Increased interstitial markings in both lungs may reflect vascular congestion, no overt edema. IMPRESSION: Stable from recent PET-CT.  No new cardiopulmonary abnormality. Electronically Signed   By:  [name] M.D.   On: 09/06/2015 08:30   Ct Head Wo Contrast  09/06/2015  CLINICAL DATA:  Altered mental status, history of small cell lung carcinoma with intracranial metastatic disease EXAM: CT HEAD WITHOUT CONTRAST TECHNIQUE: Contiguous  axial images were obtained from the base of the skull through the vertex without intravenous contrast. COMPARISON:  08/25/2015 FINDINGS: Bony calvarium is intact. No gross soft tissue abnormality is noted. Areas a scatter white matter ischemic change are seen. The known metastatic lesions are not well appreciated on this exam. No significant white matter edema is noted. Motion artifact somewhat degrades the study. No acute hemorrhage is seen. IMPRESSION: No acute abnormality noted.  Mild chronic ischemic changes are seen. Electronically Signed   By: [name] M.D.   On: 09/06/2015 08:56   *I have personally reviewed the images above*  EKG: Independently reviewed. Normal sinus rhythm at 88 bpm, LVH, anterior Q waves, no significant change from prior.   Assessment/Plan:   Principal Problem:   Acute encephalopathy secondary to hyponatremia versus cerebral edema from brain metastasis - To address hyponatremia: Start normal saline at 125 mL per hour. Continue salt tablets. - CT of the head stable. Likely has underlying vasogenic edema. Start Decadron. - Notify Dr. [name] and Dr. [name] of the patient's admission.  Active Problems:   Small cell lung cancer ([place]) - Discussed case with Dr. [name]. Will likely need inpatient initiation of chemotherapy.    Sarcoidosis (HCC)/COPD (chronic obstructive pulmonary disease) (HCC) - Continue Spiriva.     Hypertension - Continue Norvasc, atenolol, Edarbi (substitute Avapro).    Depression - Continue Wellbutrin.  DVT prophylaxis - Lovenox ordered.  Code Status / Family Communication / Disposition Plan:   Code Status: DNR Family Communication: Wife at the bedside. Disposition Plan: Home when  stable.  Attestation regarding necessity of inpatient status:   The appropriate admission status for this patient is INPATIENT. Inpatient status is judged to be reasonable and necessary in order to provide the required intensity of service to ensure the patient's safety. The patient's presenting symptoms, physical exam findings, and initial radiographic and laboratory data in the context of their chronic comorbidities is felt to place them at high risk for further clinical deterioration. Furthermore, it is not anticipated that the patient will be medically stable for discharge from the hospital within 2 midnights of admission. The following factors support the admission status of inpatient.   -The patient's presenting symptoms include agitation, restlessness, disorientation. - The worrisome physical exam findings include acute encephalopathy. - The initial radiographic and laboratory data are worrisome because of severe hyponatremia and brain metastasis. - The chronic co-morbidities include small cell lung cancer, chronic hyponatremia. - Patient requires inpatient status due to high intensity of service, high risk for further deterioration and high frequency of surveillance required. - I certify that at the point of admission it is my clinical judgment that the patient will require inpatient hospital care spanning beyond 2 midnights from the point of admission.   Time spent: 70 minutes.  RAMA,CHRISTINA [facility] Pager [phone] Cell: [phone]   If 7PM-7AM, please contact night-coverage [email or web address] Password [email or web address] 09/06/2015, 2:57 PM

## 2015-09-06 NOTE — ED Provider Notes (Signed)
CSN: [id number]     Arrival date & time 09/06/15  0745 History   First MD Initiated Contact with Patient 09/06/15 0749     Chief Complaint  Patient presents with  . Altered Mental Status     (Consider location/radiation/quality/duration/timing/severity/associated sxs/prior Treatment) HPI 64 year old male who presents with altered mental status. 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 Has been in his usual state of health according to his wife. Was last normal yesterday evening at bedtime. This morning, his wife found him sitting on the toilet with his pants on. He was confused, disoriented, and did not seem to recognize his family members. EMS was subsequently called who brought him to the ED. Point-of-care glucose was 111. Had temperature of 61F per EMS   Past Medical History  Diagnosis Date  . Hypertension     under control, has been on med. x 15-20 yrs.  [name] GERD (gastroesophageal reflux disease)   . Infection of elbow 2011    right-no infection at present , but has skin lesions present  . Sarcoidosis ([place])     "tends to have skin flareups" and presently experiencing now"feet, legs and arms"  . Chronic hyponatremia     Dr. Buddy Duty follows  . Radiation 02/11/15-02/25/15    brain 25 Gy  . DNR no code (do not resuscitate) 08/02/2015  . Lung cancer [facility])    Past Surgical History  Procedure Laterality Date  . Cholecystectomy  05/2006  . Ulnar nerve repair  01/2008    right; decompression  . Carpal tunnel release  01/2008    right  . Eye surgery  12/2008    correction ectropion, release band, wedge exc., shortening  . I&d extremity  09/12/2011    Procedure: IRRIGATION AND DEBRIDEMENT EXTREMITY;  Surgeon: Tennis Must;  Location: [facility];  Service: Orthopedics;  Laterality: Right;  I&d RIGHT OLECRANON  BURSSA   . Tonsillectomy    . Endobronchial ultrasound Bilateral 08/24/2014    Procedure: ENDOBRONCHIAL ULTRASOUND;  Surgeon: Collene Gobble, MD;  Location: [facility];  Service: Cardiopulmonary;  Laterality: Bilateral;   Family History  Problem Relation Age of Onset  . Cancer Father     Leukemia  . Cancer Mother     breast  . Dementia Mother    Social History  Substance Use Topics  . Smoking status: Former Smoker -- 1.50 packs/day for 40 years    Types: Cigarettes  . Smokeless tobacco: Former Systems developer    Quit date: 08/26/2014     Comment: currently using electronic cigarette and nicotine patch  . Alcohol Use: 8.4 oz/week    14 Standard drinks or equivalent per week     Comment: 2-4 drinks daily    Review of Systems  Unable to perform ROS: Mental status change      Allergies  Varenicline; Isothiazolinone chloride; Oxycodone; and Quaternium-15  Home Medications   Prior to Admission medications   Medication Sig Start Date End Date Taking? Authorizing Provider  amLODipine (NORVASC) 10 MG tablet Take 10 mg by mouth every morning.  07/23/14  Yes Historical Provider, MD  aspirin EC 81 MG tablet Take 81 mg by mouth every morning.    Yes Historical Provider, MD  atenolol (TENORMIN) 25 MG tablet Take 25 mg by mouth 2 (two) times daily.   Yes Historical Provider, MD  Azilsartan Medoxomil (EDARBI) 80  MG TABS Take 80 mg by mouth daily.    Yes Historical Provider, MD  buPROPion (WELLBUTRIN SR) 150 MG 12 hr tablet Take 1 tablet (150 mg total) by mouth 2 (two) times daily. 08/24/15  Yes Collene Gobble, MD  diclofenac (VOLTAREN) 75 MG EC tablet Take 75 mg by mouth 2 (two) times daily.   Yes Historical Provider, MD  dicyclomine (BENTYL) 20 MG tablet Take 20 mg by mouth every 4 (four) hours as needed for spasms.  08/02/14  Yes Historical Provider, MD  folic acid (FOLVITE) 1 MG tablet Take 1 mg by mouth 2 (two) times daily. 07/06/14  Yes Historical Provider, MD  lidocaine-prilocaine (EMLA)  cream Apply 1 application topically as needed. 08/17/15  Yes Curt Bears, MD  loperamide (IMODIUM) 2 MG capsule Take 2 mg by mouth as needed for diarrhea or loose stools.   Yes Historical Provider, MD  Multiple Vitamins-Minerals (MULTIVITAMIN ADULTS 50+ PO) Take 1 tablet by mouth every morning.    Yes Historical Provider, MD  mupirocin cream (BACTROBAN) 2 % Apply 1 application topically 2 (two) times daily.   Yes Historical Provider, MD  nicotine (NICODERM CQ - DOSED IN MG/24 HOURS) 14 mg/24hr patch Place 1 patch (14 mg total) onto the skin daily. 11/27/14  Yes Collene Gobble, MD  omeprazole (PRILOSEC) 40 MG capsule Take 40 mg by mouth every morning.   Yes Historical Provider, MD  Probiotic Product (PROBIOTIC DAILY PO) Take 1 tablet by mouth every morning.    Yes Historical Provider, MD  sodium chloride 1 G tablet Take 1 g by mouth 3 (three) times daily.   Yes Historical Provider, MD  Tiotropium Bromide Monohydrate (SPIRIVA RESPIMAT) 1.25 MCG/ACT AERS Inhale 2 puffs into the lungs daily.   Yes Historical Provider, MD  white petrolatum (VASELINE) GEL Apply 1 application topically daily as needed for lip care or dry skin.   Yes Historical Provider, MD  Albuterol Sulfate (PROAIR RESPICLICK) 626 (90 BASE) MCG/ACT AEPB Inhale 2 puffs into the lungs every 4 (four) hours as needed. Patient not taking: Reported on 09/06/2015 05/21/15   Collene Gobble, MD  nicotine (NICODERM CQ - DOSED IN MG/24 HR) 7 mg/24hr patch Place 1 patch (7 mg total) onto the skin daily. Patient not taking: Reported on 09/06/2015 07/22/15   Collene Gobble, MD  prochlorperazine (COMPAZINE) 10 MG tablet Take 1 tablet (10 mg total) by mouth every 6 (six) hours as needed for nausea or vomiting. Patient not taking: Reported on 09/06/2015 08/17/15   Curt Bears, MD   Filed Vitals:   09/06/15 1109  BP: 162/71  Pulse: 102  Temp: 100.9 F (38.3 C)  Resp: 15    Physical Exam Physical Exam  Nursing note and vitals  reviewed. Constitutional: Awake, appears ill but not toxic and in no acute distress Head: Normocephalic and atraumatic.  Mouth/Throat: Oropharynx is clear and moist.  Neck: Normal range of motion. Neck supple.  Cardiovascular: Normal rate and regular rhythm.  No edema. Pulmonary/Chest: Effort normal and breath sounds normal.  Abdominal: Soft. There is no tenderness. There is no rebound and no guarding. No CVA tenderness. Musculoskeletal: Normal range of motion no deformities.  Neurological: Alert, no facial droop, moves all 4 extremities symmetrically, EOMI, PERRL, not oriented to time, place, situation, or self. Answers questions with "what", "yes" and "no." Skin: Skin is warm and dry.  Psychiatric: Cooperative  ED Course  Procedures (including critical care time) Labs Review Labs Reviewed  CBC WITH DIFFERENTIAL/PLATELET -  Abnormal; Notable for the following:    RBC 4.07 (*)    Hemoglobin 12.6 (*)    HCT 33.9 (*)    MCHC 37.2 (*)    Lymphs Abs 0.4 (*)    All other components within normal limits  COMPREHENSIVE METABOLIC PANEL - Abnormal; Notable for the following:    Sodium 119 (*)    Chloride 85 (*)    Glucose, Bld 121 (*)    All other components within normal limits  URINALYSIS, ROUTINE W REFLEX MICROSCOPIC (NOT AT [facility]) - Abnormal; Notable for the following:    APPearance CLOUDY (*)    Hgb urine dipstick SMALL (*)    All other components within normal limits  URINE MICROSCOPIC-ADD ON - Abnormal; Notable for the following:    Bacteria, UA FEW (*)    Casts HYALINE CASTS (*)    All other components within normal limits  CBG MONITORING, ED - Abnormal; Notable for the following:    Glucose-Capillary 111 (*)    All other components within normal limits  URINE CULTURE  I-STAT CG4 LACTIC ACID, ED  I-STAT TROPOININ, ED  I-STAT CG4 LACTIC ACID, ED    Imaging Review Dg Chest 1 View  09/06/2015  CLINICAL DATA:  64 year old male with new altered mental status. Lung cancer with  recently diagnosed metastatic disease to the brain. Subsequent encounter. EXAM: CHEST 1 VIEW COMPARISON:  PET-CT 08/16/2015, and earlier. FINDINGS: Right chest porta cath now in place. Stable cardiac size and mediastinal contours. Stable lung volumes. No pneumothorax, pulmonary edema, pleural effusion. Continued streaky retrocardiac opacity. Increased interstitial markings in both lungs may reflect vascular congestion, no overt edema. IMPRESSION: Stable from recent PET-CT.  No new cardiopulmonary abnormality. Electronically Signed   By: [name] M.D.   On: 09/06/2015 08:30   Ct Head Wo Contrast  09/06/2015  CLINICAL DATA:  Altered mental status, history of small cell lung carcinoma with intracranial metastatic disease EXAM: CT HEAD WITHOUT CONTRAST TECHNIQUE: Contiguous axial images were obtained from the base of the skull through the vertex without intravenous contrast. COMPARISON:  08/25/2015 FINDINGS: Bony calvarium is intact. No gross soft tissue abnormality is noted. Areas a scatter white matter ischemic change are seen. The known metastatic lesions are not well appreciated on this exam. No significant white matter edema is noted. Motion artifact somewhat degrades the study. No acute hemorrhage is seen. IMPRESSION: No acute abnormality noted.  Mild chronic ischemic changes are seen. Electronically Signed   By: [name] M.D.   On: 09/06/2015 08:56   I have personally reviewed and evaluated these images and lab results as part of my medical decision-making.   EKG Interpretation   Date/Time:  Monday September 06 2015 08:03:20 EST Ventricular Rate:  88 PR Interval:    QRS Duration: 86 QT Interval:  397 QTC Calculation: 480 R Axis:   59 Text Interpretation:  Normal sinus rhythm Left ventricular hypertrophy  Anterior Q waves, possibly due to LVH Baseline wander in lead(s) V2 No  significant change since last tracing Confirmed by Lavaris Sexson MD, Sebastiana Wuest [phone]) on  09/06/2015 8:13:09 AM     CRITICAL  CARE Performed by: Forde Dandy   Total critical care time: 30 minutes  Critical care time was exclusive of separately billable procedures and treating other patients.  Critical care was necessary to treat or prevent imminent or life-threatening deterioration.  Critical care was time spent personally by me on the following activities: Management of acute cephalopathy and hyponatremia, development of treatment  plan with patient and/or surrogate as well as nursing, discussions with consultants, evaluation of patient's response to treatment, examination of patient, obtaining history from patient or surrogate, ordering and performing treatments and interventions, ordering and review of laboratory studies, ordering and review of radiographic studies, pulse oximetry and re-evaluation of patient's condition.  MDM   Final diagnoses:  Hyponatremia  Acute encephalopathy    64 year old male with history of small cell lung carcinoma status post stereotactic radiosurgery and history of hyponatremia who presents with altered mental status. Although EMS did obtain a temperature of 99 Fahrenheit by mouth, rectal temperature reveals that he is afebrile. Vital signs are stable. On exam, he appears encephalopathic, only answering questions with what yes or no answers. Does move all extremities symmetrically, and without focal neurological deficit. Point-of-care glucose was normal, and CT head is stable but he does have known metastasis that his not been stereotactically removed. No infectious etiology, as UA does not show acute infection and chest x-ray without pneumonia. Blood work without elevated lactate or leukocytosis. He does have worsening hyponatremia with a sodium of 119, which likely place a role in this acute cephalopathy today. He is started on normal saline at 125 mL per hour. Discussed with Dr. Rockne Menghini and admitted to [facility] for ongoing management.    Forde Dandy, MD 09/06/15 [phone]

## 2015-09-06 NOTE — Progress Notes (Signed)
Provider paged regarding patients continued agitation and inability to take PO medications at this time d/t AMS.

## 2015-09-06 NOTE — Telephone Encounter (Signed)
P.O.F. Generated by this nurse.  Observed tomorrow's schedule has already been cancelled.

## 2015-09-06 NOTE — ED Notes (Signed)
Per EMS, wife found pt on toilet and reported that he was altered. Pt's wife reported that he was not answering questions, not able to state his birthday or his name. Per EMS, pt is fidgety and unable to sit still, pulling on leads and seatbelt en route to hospital. Pt has a hx of brain CA. Temp of 99.9 obtained by fire on scene. Pt non-ambulatory at this time.

## 2015-09-06 NOTE — ED Notes (Signed)
Bed: YO37 Expected date:  Expected time:  Means of arrival:  Comments: Found on the toilet, confused, brain cancer

## 2015-09-06 NOTE — Telephone Encounter (Signed)
Cancer chemotherapy for now until he gets out of the hospital and finishes his radiation treatment to the brain

## 2015-09-07 ENCOUNTER — Ambulatory Visit: Payer: Commercial Managed Care - HMO | Admitting: Physician Assistant

## 2015-09-07 ENCOUNTER — Encounter: Payer: Self-pay | Admitting: *Deleted

## 2015-09-07 ENCOUNTER — Ambulatory Visit: Payer: Commercial Managed Care - HMO

## 2015-09-07 ENCOUNTER — Other Ambulatory Visit: Payer: Commercial Managed Care - HMO

## 2015-09-07 DIAGNOSIS — R410 Disorientation, unspecified: Secondary | ICD-10-CM

## 2015-09-07 DIAGNOSIS — R451 Restlessness and agitation: Secondary | ICD-10-CM

## 2015-09-07 DIAGNOSIS — R531 Weakness: Secondary | ICD-10-CM

## 2015-09-07 DIAGNOSIS — C3490 Malignant neoplasm of unspecified part of unspecified bronchus or lung: Secondary | ICD-10-CM

## 2015-09-07 DIAGNOSIS — C7931 Secondary malignant neoplasm of brain: Secondary | ICD-10-CM

## 2015-09-07 DIAGNOSIS — R4182 Altered mental status, unspecified: Secondary | ICD-10-CM

## 2015-09-07 LAB — BASIC METABOLIC PANEL
ANION GAP: 10 (ref 5–15)
BUN: 12 mg/dL (ref 6–20)
CALCIUM: 8.9 mg/dL (ref 8.9–10.3)
CO2: 21 mmol/L — ABNORMAL LOW (ref 22–32)
Chloride: 92 mmol/L — ABNORMAL LOW (ref 101–111)
Creatinine, Ser: 0.57 mg/dL — ABNORMAL LOW (ref 0.61–1.24)
Glucose, Bld: 127 mg/dL — ABNORMAL HIGH (ref 65–99)
Potassium: 3.9 mmol/L (ref 3.5–5.1)
SODIUM: 123 mmol/L — AB (ref 135–145)

## 2015-09-07 LAB — URINE CULTURE: CULTURE: NO GROWTH

## 2015-09-07 MED ORDER — MORPHINE SULFATE (PF) 2 MG/ML IV SOLN
1.0000 mg | Freq: Once | INTRAVENOUS | Status: AC
  Administered 2015-09-07: 1 mg via INTRAVENOUS
  Filled 2015-09-07: qty 1

## 2015-09-07 MED ORDER — ENSURE ENLIVE PO LIQD
237.0000 mL | Freq: Two times a day (BID) | ORAL | Status: DC
  Administered 2015-09-07 – 2015-09-15 (×14): 237 mL via ORAL

## 2015-09-07 NOTE — Progress Notes (Addendum)
Progress Note   Spencer Long ZHG:[id number] DOB: [birth year] DOA: 09/06/2015 PCP: [name], MD   Brief Narrative:   Spencer Long is an 64 y.o. male with a PMH of small cell lung cancer with brain metastasis, status post stereotactic radiation therapy under the care of Dr. [name] and chronic hyponatremia on salt tablets, compliant with same, was brought to the hospital 09/06/15 via EMS secondary to altered mental status. Upon initial evaluation in the ED, the patient was noted to have a sodium of 119. CT of the head was stable compared to prior.  Assessment/Plan:   Principal Problem:  Acute encephalopathy secondary to hyponatremia versus cerebral edema from brain metastasis - Sodium 119---> 123 with IV fluids, salt tablets and demeclocycline. Continue. - CT of the head stable. Likely has underlying vasogenic edema. Continue Decadron. - Dr. [name] and Dr. [name] has been notified of the patient's admission. - Mental status improving. Advance diet.  Active Problems:  Small cell lung cancer ([place]) - Discussed case with Dr. [name]. Will likely need inpatient initiation of chemotherapy.   Sarcoidosis (HCC)/COPD (chronic obstructive pulmonary disease) (HCC) - Continue Spiriva.    Hypertension - Continue Norvasc, atenolol, Edarbi (substitute Avapro).   Depression - Continue Wellbutrin.    DVT prophylaxis - Lovenox ordered.  Family Communication/Anticipated D/C date and plan/Code Status   Family Communication: Wife updated at the bedside. Disposition Plan: Home when mental status cleared. Anticipated D/C date:   3-4 days depending on clearing of sensorium, improvement, completion of chemotherapy. Code Status:     Code Status Orders        Start     Ordered   09/06/15 1456  Do not attempt resuscitation (DNR)   Continuous    Question Answer Comment  In the event of cardiac or respiratory ARREST Do not call a "code blue"   In the event of cardiac  or respiratory ARREST Do not perform Intubation, CPR, defibrillation or ACLS   In the event of cardiac or respiratory ARREST Use medication by any route, position, wound care, and other measures to relive pain and suffering. May use oxygen, suction and manual treatment of airway obstruction as needed for comfort.      09/06/15 1455    Advance Directive Documentation        Most Recent Value   Type of Advance Directive  Healthcare Power of Attorney, Living will   Pre-existing out of facility DNR order (yellow form or pink MOST form)     "MOST" Form in Place?         IV Access:    Peripheral IV   Procedures and diagnostic studies:   Dg Chest 1 View  09/06/2015  CLINICAL DATA:  64 year old male with new altered mental status. Lung cancer with recently diagnosed metastatic disease to the brain. Subsequent encounter. EXAM: CHEST 1 VIEW COMPARISON:  PET-CT 08/16/2015, and earlier. FINDINGS: Right chest porta cath now in place. Stable cardiac size and mediastinal contours. Stable lung volumes. No pneumothorax, pulmonary edema, pleural effusion. Continued streaky retrocardiac opacity. Increased interstitial markings in both lungs may reflect vascular congestion, no overt edema. IMPRESSION: Stable from recent PET-CT.  No new cardiopulmonary abnormality. Electronically Signed   By: [name] M.D.   On: 09/06/2015 08:30   Ct Head Wo Contrast  09/06/2015  CLINICAL DATA:  Altered mental status, history of small cell lung carcinoma with intracranial metastatic disease EXAM: CT HEAD WITHOUT CONTRAST TECHNIQUE: Contiguous axial images were obtained from the  base of the skull through the vertex without intravenous contrast. COMPARISON:  08/25/2015 FINDINGS: Bony calvarium is intact. No gross soft tissue abnormality is noted. Areas a scatter white matter ischemic change are seen. The known metastatic lesions are not well appreciated on this exam. No significant white matter edema is noted. Motion artifact  somewhat degrades the study. No acute hemorrhage is seen. IMPRESSION: No acute abnormality noted.  Mild chronic ischemic changes are seen. Electronically Signed   By: [name] M.D.   On: 09/06/2015 08:56     Medical Consultants:    Dr. Curt Bears, Oncology  Anti-Infectives:   Anti-infectives    Start     Dose/Rate Route Frequency Ordered Stop   09/06/15 1300  demeclocycline (DECLOMYCIN) tablet 150 mg     150 mg Oral 4 times per day 09/06/15 1219        Subjective:   Spencer Long is more awake and responsive today. He is able to follow some commands, although he has poor attention. Wife reports that he has been verbally hostile to her at times, and that he continues to pull at his IV and attempt to get out of bed.  Objective:    Filed Vitals:   09/06/15 1109 09/06/15 2112 09/07/15 0042 09/07/15 0608  BP: 162/71 90/74 154/74 134/71  Pulse: 102 90  86  Temp: 100.9 F (38.3 C) 97.8 F (36.6 C)  98.1 F (36.7 C)  TempSrc: Oral Oral  Oral  Resp: '15 16  16  '$ Height: '5\' 9"'$  (1.753 m)     Weight: 72 kg (158 lb 11.7 oz)     SpO2: 99% 100%      Intake/Output Summary (Last 24 hours) at 09/07/15 1400 Last data filed at 09/07/15 0700  Gross per 24 hour  Intake 1664.58 ml  Output    900 ml  Net 764.58 ml   Filed Weights   09/06/15 1109  Weight: 72 kg (158 lb 11.7 oz)    Exam: Gen:  Restless but more alert Cardiovascular:  Tachycardia, No M/R/G Respiratory:  Lungs CTAB Gastrointestinal:  Abdomen soft, NT/ND, + BS Extremities:  No C/E/C   Data Reviewed:    Labs: Basic Metabolic Panel:  Recent Labs Lab 09/06/15 0817 09/07/15 1010  NA 119* 123*  K 4.5 3.9  CL 85* 92*  CO2 24 21*  GLUCOSE 121* 127*  BUN 14 12  CREATININE 0.88 0.57*  CALCIUM 9.2 8.9   GFR Estimated Creatinine Clearance: 92.1 mL/min (by C-G formula based on Cr of 0.57). Liver Function Tests:  Recent Labs Lab 09/06/15 0817  AST 37  ALT 26  ALKPHOS 87  BILITOT 1.1  PROT 6.7   ALBUMIN 4.3   CBC:  Recent Labs Lab 09/06/15 0817  WBC 6.5  NEUTROABS 5.3  HGB 12.6*  HCT 33.9*  MCV 83.3  PLT 275   CBG:  Recent Labs Lab 09/06/15 0753  GLUCAP 111*   Sepsis Labs:  Recent Labs Lab 09/06/15 0817 09/06/15 0828  WBC 6.5  --   LATICACIDVEN  --  1.34   Microbiology Recent Results (from the past 240 hour(s))  Urine culture     Status: None   Collection Time: 09/06/15  8:17 AM  Result Value Ref Range Status   Specimen Description URINE, CLEAN CATCH  Final   Special Requests NONE  Final   Culture   Final    NO GROWTH 1 DAY Performed at [facility]    Report Status 09/07/2015 FINAL  Final     Medications:   . amLODipine  10 mg Oral Daily  . aspirin EC  81 mg Oral Daily  . atenolol  25 mg Oral BID  . buPROPion  150 mg Oral BID  . demeclocycline  150 mg Oral 4 times per day  . dexamethasone  4 mg Intravenous 4 times per day  . diclofenac  75 mg Oral BID  . enoxaparin (LOVENOX) injection  40 mg Subcutaneous Q24H  . feeding supplement (ENSURE ENLIVE)  237 mL Oral BID BM  . folic acid  1 mg Oral BID  . irbesartan  300 mg Oral Daily  . multivitamin with minerals  1 tablet Oral Daily  . nicotine  14 mg Transdermal Daily  . pantoprazole  40 mg Oral Daily  . sodium chloride  3 mL Intravenous Q12H  . sodium chloride  1 g Oral TID   Continuous Infusions: . sodium chloride 125 mL/hr at 09/07/15 0121    Time spent: 35 minutes.  The patient is medically complex with multiple co-morbidities including small cell lung cancer with brain metastasis, hyponatremia and ongoing altered sensorium and is at high risk for clinical deterioration and requires high complexity decision making.    LOS: 1 day   Zakery Normington  [facility] Pager [phone]. If unable to reach me by pager, please call my cell phone at [phone].  *Please refer to [email or web address], password [email or web address] to get updated schedule on who will round on this patient, as hospitalists switch  teams weekly. If 7PM-7AM, please contact night-coverage at [email or web address], password [email or web address] for any overnight needs.  09/07/2015, 2:00 PM

## 2015-09-07 NOTE — Progress Notes (Signed)
Spencer Long   DOB:1951/09/16   XB#:[id number]   NUU#:[id number]  Subjective  64 year old white male with extensive stage small cell lung cancer admitted with acute mental status changes on 09/06/15.   He is s/p recent chemotherapy with cisplatin and etoposide status post 6 cycles, followed by prophylactic cranial irradiation. Unfortunately the recent MRI of the brain as well as PET scan showed evidence for disease recurrence.The patient was seen by Dr. [name] for stereotactic radiotherapy to the metastatic brain lesions initiated on 10/28 He was to start a palliative chemo regimen consisting of carboplatin for AUC of 5 on day 1 and etoposide 100 MG/M2 on days 1, 2 and 3 on 08/31/15, after completion of the stereotactic radiotherapy to the brain. He was to receive Neulasta support on day 4.  On admission, a CT head without contrast was negative for acute intracranial abnormalities. CXR was stable from recent PET scan, without new findings. However, he was found to be hyponatremic, with a sodium of 119 now managed by the primary team with salt tablets and IVF.  He was also placed on IV Decadron  His overall status began to improve. He is less confused and more interactive.  Denies fevers, chills, night sweats, vision changes, or mucositis. Denies shortness of breath or cough. Denies any chest pain or palpitations. Denies lower extremity swelling. Denies nausea, heartburn or change in bowel habits. Appetite is normal. Denies any dysuria. Denies abnormal skin rashes, or neuropathy. Denies any bleeding issues such as epistaxis, hematemesis, hematuria or hematochezia. His chemo plans are currently on hold. We were notified of the patient's admission  DIAGNOSIS: Extensive Stage (T2, N1, M1b) Small cell lung Cancer diagnosed in October 2015.  PRIOR THERAPY:  1) Systemic chemotherapy with cisplatin 60 MG/M2 on day 1 and etoposide 120 MG/M2 on days 1, 2 and 3 with Neulasta support on day 4, status post 6  cycles. 2) prophylactic cranial irradiation under the care of Dr. Sondra Come, beginning on 10/28  CURRENT THERAPY: Systemic chemotherapy with carboplatin for AUC of 5 on day 1 and etoposide 100 MG/M2 on days 1, 2 and 3 with Neulasta support on day 4. First dose was expected on 08/31/2015.  Scheduled Meds: . amLODipine  10 mg Oral Daily  . aspirin EC  81 mg Oral Daily  . atenolol  25 mg Oral BID  . buPROPion  150 mg Oral BID  . demeclocycline  150 mg Oral 4 times per day  . dexamethasone  4 mg Intravenous 4 times per day  . diclofenac  75 mg Oral BID  . enoxaparin (LOVENOX) injection  40 mg Subcutaneous Q24H  . folic acid  1 mg Oral BID  . irbesartan  300 mg Oral Daily  . multivitamin with minerals  1 tablet Oral Daily  . nicotine  14 mg Transdermal Daily  . pantoprazole  40 mg Oral Daily  . sodium chloride  3 mL Intravenous Q12H  . sodium chloride  1 g Oral TID  . tiotropium  18 mcg Inhalation Daily   Continuous Infusions: . sodium chloride 125 mL/hr at 09/07/15 0121   PRN Meds:.acetaminophen **OR** acetaminophen, dicyclomine, ondansetron **OR** ondansetron (ZOFRAN) IV, polyethylene glycol  Objective:  Filed Vitals:   09/07/15 0608  BP: 134/71  Pulse: 86  Temp: 98.1 F (36.7 C)  Resp: 16    Body mass index is 23.43 kg/(m^2).  Intake/Output Summary (Last 24 hours) at 09/07/15 0735 Last data filed at 09/07/15 0700  Gross per 24 hour  Intake 1664.58 ml  Output   1000 ml  Net 664.58 ml               General: alert, responds to simple questions, no acute distress  Sclerae unicteric  Oropharynx clear  No peripheral adenopathy  Lungs clear -- no rales or rhonchi  Heart regular rate and rhythm  Abdomen benign  MSK no focal spinal tenderness, no peripheral edema  Neuro more oriented, able to follow commands.Gait not tested.     CBG (last 3)   Recent Labs  09/06/15 0753  GLUCAP 111*      Labs:   Recent Labs Lab 09/06/15 0817  WBC 6.5  HGB 12.6*  HCT 33.9*   PLT 275  MCV 83.3  MCH 31.0  MCHC 37.2*  RDW 12.1  LYMPHSABS 0.4*  MONOABS 0.8  EOSABS 0.0  BASOSABS 0.0     Chemistries:    Recent Labs Lab 09/06/15 0817  NA 119*  K 4.5  CL 85*  CO2 24  GLUCOSE 121*  BUN 14  CREATININE 0.88  CALCIUM 9.2  AST 37  ALT 26  ALKPHOS 87  BILITOT 1.1     GFR Estimated Creatinine Clearance: 83.7 mL/min (by C-G formula based on Cr of 0.88). Liver Function Tests:  Recent Labs Lab 09/06/15 0817  AST 37  ALT 26  ALKPHOS 87  BILITOT 1.1  PROT 6.7  ALBUMIN 4.3     Studies:  Dg Chest 1 View  09/06/2015  CLINICAL DATA:  64 year old male with new altered mental status. Lung cancer with recently diagnosed metastatic disease to the brain. Subsequent encounter. EXAM: CHEST 1 VIEW COMPARISON:  PET-CT 08/16/2015, and earlier. FINDINGS: Right chest porta cath now in place. Stable cardiac size and mediastinal contours. Stable lung volumes. No pneumothorax, pulmonary edema, pleural effusion. Continued streaky retrocardiac opacity. Increased interstitial markings in both lungs may reflect vascular congestion, no overt edema. IMPRESSION: Stable from recent PET-CT.  No new cardiopulmonary abnormality. Electronically Signed   By: [name] M.D.   On: 09/06/2015 08:30   Ct Head Wo Contrast  09/06/2015  CLINICAL DATA:  Altered mental status, history of small cell lung carcinoma with intracranial metastatic disease EXAM: CT HEAD WITHOUT CONTRAST TECHNIQUE: Contiguous axial images were obtained from the base of the skull through the vertex without intravenous contrast. COMPARISON:  08/25/2015 FINDINGS: Bony calvarium is intact. No gross soft tissue abnormality is noted. Areas a scatter white matter ischemic change are seen. The known metastatic lesions are not well appreciated on this exam. No significant white matter edema is noted. Motion artifact somewhat degrades the study. No acute hemorrhage is seen. IMPRESSION: No acute abnormality noted.  Mild chronic  ischemic changes are seen. Electronically Signed   By: [name] M.D.   On: 09/06/2015 08:56     Assessment / Plan:   64 year old white male with extensive stage small cell lung cancer admitted with acute mental status changes on 09/06/15.    He is s/p recent chemotherapy with cisplatin and etoposide status post 6 cycles, followed by prophylactic cranial irradiation. Unfortunately the recent MRI of the brain as well as PET scan showed evidence for disease recurrence.The patient was seen by Dr. [name] for stereotactic radiotherapy to the metastatic brain lesions initiated on 10/28 He was to start a palliative chemo regimen consisting of carboplatin for AUC of 5 on day 1 and etoposide 100 MG/M2 on days 1, 2 and 3 on 08/31/15, after completion of the stereotactic radiotherapy  to the brain. He was to receive Neulasta support on day 4. Likely to start chemo once Na and his clinical status improves  AMS/Agitation THis is likely due to hyponatremia CT head stable Admission NA was 119, now improving to 123 Continue salt tablets Appreciate primary team's involvement  DVT prophylaxis On Lovenox  DNR  Other medical issues as per admitting team     Elease Hashimoto 09/07/2015  7:35 AM Medical Oncology and Hematology [place]  ADDENDUM: Hematology/Oncology Attending: The patient is seen and examined today. I agree with the above note. This is a very pleasant 64 years old white male with extensive stage small cell lung cancer status post 6 cycles of systemic chemotherapy with cisplatin and etoposide followed by prophylactic cranial irradiation. Unfortunately the patient was recently found to have evidence for disease recurrence with metastases to the brain as well as systemic metastasis. He was scheduled to have stereotactic radiotherapy to the metastatic brain lesions last week but unfortunately because of accelerator malfunction his treatment was delayed. He was admitted to  the hospital yesterday with acute mental status change with confusion and significant weakness. During his evaluation and his serum sodium was low at 119. The patient was started on IV hydration with normal saline in addition to salt tablets. He eats a little bit better today. I had a lengthy discussion with the patient and his wife about his current condition and treatment options. I discussed with the patient and his wife the option of palliative care versus consideration of palliative systemic chemotherapy with carboplatin and etoposide. The patient is still very weak and confused. I would like to see some improvement in his condition before considering him for the systemic chemotherapy. He will also undergo stereotactic radiotherapy to the metastatic brain lesion in the next few days if possible. I will arrange for the patient a follow-up appointment with me at the [facility] after discharge for more detailed discussion of his treatment, but I may consider starting the treatment quite the patient is in the hospital if needed. The patient and his wife agreed to the current plan. Thank you so much for taking good care of Mr. Kaylor, I will continue to follow up the patient with you and assist in his management on as-needed basis.  Disclaimer: This note was dictated with voice recognition software. Similar sounding words can inadvertently be transcribed and may be missed upon review.

## 2015-09-07 NOTE — Progress Notes (Signed)
Initial Nutrition Assessment  DOCUMENTATION CODES:   Not applicable  INTERVENTION:  Ensure Enlive po BID, each supplement provides 350 kcal and 20 grams of protein  NUTRITION DIAGNOSIS:   Increased nutrient needs related to catabolic illness, cancer and cancer related treatments as evidenced by estimated needs.  GOAL:   Patient will meet greater than or equal to 90% of their needs  MONITOR:   PO intake, Supplement acceptance, Labs, I & O's  REASON FOR ASSESSMENT:   Low Braden    ASSESSMENT:   Spencer Long is an 64 y.o. male with a PMH of small cell lung cancer with brain metastasis, status post stereotactic radiation therapy under the care of Dr. [name] and chronic hyponatremia on salt tablets, compliant with same, was brought to the hospital via EMS secondary to altered mental status  PTA patient called out his to his wife while in bathroom, had pants on while trying to urinate. Admitted with Sodium of 119. Spoke with patient's daughter at bedside. Pt was unable to answer questions.   Daughter said pt had a good appetite at home, no chewing or swalling issues, no nausea/vomitting.  Nutrition-Focused physical exam completed. Findings are mild fat depletion, no muscle depletion, and no edema.   Pt exhibits 8#/4.4% insignificant wt loss in 10 days. Wt was stable prior to that. Likely cause of wt loss is increased nutrient needs related to cancer w/ mets. Will provide ensure enlive during stay.  Diet Order:  Diet regular Room service appropriate?: Yes with Assist; Fluid consistency:: Thin Diet clear liquid Room service appropriate?: No; Fluid consistency:: Thin  Skin:  Wound (see comment) (Laceration to L elbow)  Last BM:  09/06/2015  Height:   Ht Readings from Last 1 Encounters:  09/06/15 '5\' 9"'$  (1.753 m)    Weight:   Wt Readings from Last 1 Encounters:  09/06/15 158 lb 11.7 oz (72 kg)    Ideal Body Weight:  72.72 kg  BMI:  Body mass index is 23.43  kg/(m^2).  Estimated Nutritional Needs:   Kcal:  5909-3112  Protein:  85-110 grams  Fluid:  >/= 2.5L  EDUCATION NEEDS:   No education needs identified at this time  Satira Anis. Taino Maertens, MS, RD LDN After Hours/Weekend Pager [phone]

## 2015-09-07 NOTE — Progress Notes (Signed)
Oncology Nurse Navigator Documentation  Oncology Nurse Navigator Flowsheets 09/07/2015  Navigator Encounter Type Other  Patient Visit Type Inpatient/spoke with patient and wife at hospital.  Patient and family were updated on next steps with Dr. [name].  Patient will be starting tx next week if not before.  Dr. [name] will follow up with Rad Onc.   Treatment Phase Treatment  Time Spent with Patient 15

## 2015-09-08 ENCOUNTER — Ambulatory Visit: Payer: Commercial Managed Care - HMO

## 2015-09-08 DIAGNOSIS — E871 Hypo-osmolality and hyponatremia: Principal | ICD-10-CM

## 2015-09-08 DIAGNOSIS — G9340 Encephalopathy, unspecified: Secondary | ICD-10-CM

## 2015-09-08 DIAGNOSIS — J449 Chronic obstructive pulmonary disease, unspecified: Secondary | ICD-10-CM

## 2015-09-08 LAB — BASIC METABOLIC PANEL
ANION GAP: 8 (ref 5–15)
BUN: 11 mg/dL (ref 6–20)
CO2: 23 mmol/L (ref 22–32)
Calcium: 8.6 mg/dL — ABNORMAL LOW (ref 8.9–10.3)
Chloride: 94 mmol/L — ABNORMAL LOW (ref 101–111)
Creatinine, Ser: 0.53 mg/dL — ABNORMAL LOW (ref 0.61–1.24)
GLUCOSE: 142 mg/dL — AB (ref 65–99)
Potassium: 3.6 mmol/L (ref 3.5–5.1)
Sodium: 125 mmol/L — ABNORMAL LOW (ref 135–145)

## 2015-09-08 MED ORDER — SODIUM CHLORIDE 0.9 % IJ SOLN
10.0000 mL | INTRAMUSCULAR | Status: DC | PRN
  Administered 2015-09-08 – 2015-09-15 (×3): 10 mL
  Filled 2015-09-08 (×2): qty 40

## 2015-09-08 NOTE — Progress Notes (Signed)
Progress Note   ABANOUB HANKEN SWF:[id number] DOB: 12-16-1950 DOA: 09/06/2015 PCP: [name], MD   Brief Narrative:   OAKES MCCREADY is an 64 y.o. male with a PMH of small cell lung cancer with brain metastasis, status post stereotactic radiation therapy under the care of Dr. [name] and chronic hyponatremia on salt tablets, compliant with same, was brought to the hospital 09/06/15 via EMS secondary to altered mental status. Upon initial evaluation in the ED, the patient was noted to have a sodium of 119. CT of the head was stable compared to prior.  Assessment/Plan:   Principal Problem:  Acute encephalopathy secondary to hyponatremia versus cerebral edema from brain metastasis - Sodium 119---> 125 with IV fluids, salt tablets and demeclocycline. Continue. - CT of the head stable. Likely has underlying vasogenic edema. Continue Decadron. - Dr. [name] and Dr. [name] has been notified of the patient's admission. - Mental status improving. Advance diet.   Active Problems:  Small cell lung cancer ([place]) - Discussed case with Dr. [name] - plan for chemo outpatient when he re covers from this acute confusion.    Sarcoidosis (HCC)/COPD (chronic obstructive pulmonary disease) (HCC) - Continue Spiriva.    Hypertension - Continue Norvasc, atenolol, Edarbi (substitute Avapro).   Depression - Continue Wellbutrin.    DVT prophylaxis - Lovenox ordered.  Family Communication/Anticipated D/C date and plan/Code Status   Family Communication: Wife updated at the bedside. Disposition Plan: Home when mental status cleared. Anticipated D/C date:   3-4 days depending on clearing of sensorium, improvement, completion of chemotherapy. Code Status:     Code Status Orders        Start     Ordered   09/06/15 1456  Do not attempt resuscitation (DNR)   Continuous    Question Answer Comment  In the event of cardiac or respiratory ARREST Do not call a "code blue"   In  the event of cardiac or respiratory ARREST Do not perform Intubation, CPR, defibrillation or ACLS   In the event of cardiac or respiratory ARREST Use medication by any route, position, wound care, and other measures to relive pain and suffering. May use oxygen, suction and manual treatment of airway obstruction as needed for comfort.      09/06/15 1455    Advance Directive Documentation        Most Recent Value   Type of Advance Directive  Healthcare Power of Attorney, Living will   Pre-existing out of facility DNR order (yellow form or pink MOST form)     "MOST" Form in Place?         IV Access:    Peripheral IV   Procedures and diagnostic studies:   Dg Chest 1 View  09/06/2015  CLINICAL DATA:  64 year old male with new altered mental status. Lung cancer with recently diagnosed metastatic disease to the brain. Subsequent encounter. EXAM: CHEST 1 VIEW COMPARISON:  PET-CT 08/16/2015, and earlier. FINDINGS: Right chest porta cath now in place. Stable cardiac size and mediastinal contours. Stable lung volumes. No pneumothorax, pulmonary edema, pleural effusion. Continued streaky retrocardiac opacity. Increased interstitial markings in both lungs may reflect vascular congestion, no overt edema. IMPRESSION: Stable from recent PET-CT.  No new cardiopulmonary abnormality. Electronically Signed   By: [name] M.D.   On: 09/06/2015 08:30   Ct Head Wo Contrast  09/06/2015  CLINICAL DATA:  Altered mental status, history of small cell lung carcinoma with intracranial metastatic disease EXAM: CT HEAD WITHOUT CONTRAST  TECHNIQUE: Contiguous axial images were obtained from the base of the skull through the vertex without intravenous contrast. COMPARISON:  08/25/2015 FINDINGS: Bony calvarium is intact. No gross soft tissue abnormality is noted. Areas a scatter white matter ischemic change are seen. The known metastatic lesions are not well appreciated on this exam. No significant white matter edema is noted.  Motion artifact somewhat degrades the study. No acute hemorrhage is seen. IMPRESSION: No acute abnormality noted.  Mild chronic ischemic changes are seen. Electronically Signed   By: [name] M.D.   On: 09/06/2015 08:56     Medical Consultants:    Dr. Curt Bears, Oncology  Anti-Infectives:   Anti-infectives    Start     Dose/Rate Route Frequency Ordered Stop   09/06/15 1300  demeclocycline (DECLOMYCIN) tablet 150 mg     150 mg Oral 4 times per day 09/06/15 1219        Subjective:   Spencer Long is more awake and responsive today. He is able to follow some commands,.  Objective:    Filed Vitals:   09/07/15 2235 09/08/15 0555 09/08/15 0900 09/08/15 1424  BP: 147/65 139/71 141/62 131/61  Pulse: 101 92 95 89  Temp: 97.5 F (36.4 C) 97.6 F (36.4 C) 98.1 F (36.7 C) 97.5 F (36.4 C)  TempSrc: Oral Oral Oral Oral  Resp: '18 18 18 20  '$ Height:      Weight:      SpO2: 96% 97% 95% 98%    Intake/Output Summary (Last 24 hours) at 09/08/15 1700 Last data filed at 09/08/15 0630  Gross per 24 hour  Intake 2177.5 ml  Output   1075 ml  Net 1102.5 ml   Filed Weights   09/06/15 1109  Weight: 72 kg (158 lb 11.7 oz)    Exam: Gen:  Restless but more alert Cardiovascular:  Tachycardia, No M/R/G Respiratory:  Lungs CTAB Gastrointestinal:  Abdomen soft, NT/ND, + BS Extremities:  No C/E/C   Data Reviewed:    Labs: Basic Metabolic Panel:  Recent Labs Lab 09/06/15 0817 09/07/15 1010 09/08/15 0513  NA 119* 123* 125*  K 4.5 3.9 3.6  CL 85* 92* 94*  CO2 24 21* 23  GLUCOSE 121* 127* 142*  BUN '14 12 11  '$ CREATININE 0.88 0.57* 0.53*  CALCIUM 9.2 8.9 8.6*   GFR Estimated Creatinine Clearance: 92.1 mL/min (by C-G formula based on Cr of 0.53). Liver Function Tests:  Recent Labs Lab 09/06/15 0817  AST 37  ALT 26  ALKPHOS 87  BILITOT 1.1  PROT 6.7  ALBUMIN 4.3   CBC:  Recent Labs Lab 09/06/15 0817  WBC 6.5  NEUTROABS 5.3  HGB 12.6*  HCT 33.9*   MCV 83.3  PLT 275   CBG:  Recent Labs Lab 09/06/15 0753  GLUCAP 111*   Sepsis Labs:  Recent Labs Lab 09/06/15 0817 09/06/15 0828  WBC 6.5  --   LATICACIDVEN  --  1.34   Microbiology Recent Results (from the past 240 hour(s))  Urine culture     Status: None   Collection Time: 09/06/15  8:17 AM  Result Value Ref Range Status   Specimen Description URINE, CLEAN CATCH  Final   Special Requests NONE  Final   Culture   Final    NO GROWTH 1 DAY Performed at [facility]    Report Status 09/07/2015 FINAL  Final     Medications:   . amLODipine  10 mg Oral Daily  . aspirin EC  81 mg Oral Daily  . atenolol  25 mg Oral BID  . buPROPion  150 mg Oral BID  . demeclocycline  150 mg Oral 4 times per day  . dexamethasone  4 mg Intravenous 4 times per day  . diclofenac  75 mg Oral BID  . enoxaparin (LOVENOX) injection  40 mg Subcutaneous Q24H  . feeding supplement (ENSURE ENLIVE)  237 mL Oral BID BM  . folic acid  1 mg Oral BID  . irbesartan  300 mg Oral Daily  . multivitamin with minerals  1 tablet Oral Daily  . nicotine  14 mg Transdermal Daily  . pantoprazole  40 mg Oral Daily  . sodium chloride  3 mL Intravenous Q12H  . sodium chloride  1 g Oral TID   Continuous Infusions: . sodium chloride 125 mL/hr at 09/08/15 1131    Time spent: 25  minutes.     LOS: 2 days   [facility]  [facility] Pager [phone]   *Please refer to [email or web address], password [email or web address] to get updated schedule on who will round on this patient, as hospitalists switch teams weekly. If 7PM-7AM, please contact night-coverage at [email or web address], password [email or web address] for any overnight needs.  09/08/2015, 5:00 PM

## 2015-09-09 ENCOUNTER — Ambulatory Visit: Payer: Commercial Managed Care - HMO

## 2015-09-09 LAB — BASIC METABOLIC PANEL
Anion gap: 8 (ref 5–15)
BUN: 13 mg/dL (ref 6–20)
CHLORIDE: 93 mmol/L — AB (ref 101–111)
CO2: 24 mmol/L (ref 22–32)
CREATININE: 0.57 mg/dL — AB (ref 0.61–1.24)
Calcium: 8.4 mg/dL — ABNORMAL LOW (ref 8.9–10.3)
GFR calc Af Amer: >60 mL/min (ref >60)
GFR calc non Af Amer: >60 mL/min (ref >60)
GLUCOSE: 134 mg/dL — AB (ref 65–99)
Potassium: 4.4 mmol/L (ref 3.5–5.1)
Sodium: 125 mmol/L — ABNORMAL LOW (ref 135–145)

## 2015-09-09 MED ORDER — LORAZEPAM 1 MG PO TABS
1.0000 mg | ORAL_TABLET | Freq: Four times a day (QID) | ORAL | Status: AC | PRN
  Administered 2015-09-09 – 2015-09-10 (×2): 1 mg via ORAL
  Filled 2015-09-09 (×2): qty 1

## 2015-09-09 MED ORDER — VITAMIN B-1 100 MG PO TABS
100.0000 mg | ORAL_TABLET | Freq: Every day | ORAL | Status: DC
  Administered 2015-09-09 – 2015-09-15 (×7): 100 mg via ORAL
  Filled 2015-09-09 (×7): qty 1

## 2015-09-09 MED ORDER — HALOPERIDOL LACTATE 5 MG/ML IJ SOLN: 2.0000 mg | Freq: Four times a day (QID) | INTRAMUSCULAR | Status: DC | PRN

## 2015-09-09 MED ORDER — THIAMINE HCL 100 MG/ML IJ SOLN: 100.0000 mg | Freq: Every day | INTRAMUSCULAR | Status: DC

## 2015-09-09 MED ORDER — LORAZEPAM 2 MG/ML IJ SOLN: 1.0000 mg | Freq: Four times a day (QID) | INTRAMUSCULAR | Status: AC | PRN

## 2015-09-09 NOTE — Progress Notes (Signed)
Progress Note   KAILEN HINKLE MHD:[id number] DOB: 1951/07/26 DOA: 09/06/2015 PCP: Spencer Austria, MD   Brief Narrative:   Spencer Long is an 64 y.o. male with a PMH of small cell lung cancer with brain metastasis, status post stereotactic radiation therapy under the care of Dr. Lisbeth Long and chronic hyponatremia on salt tablets, compliant with same, was brought to the hospital 09/06/15 via EMS secondary to altered mental status. Upon initial evaluation in the ED, the patient was noted to have a sodium of 119. CT of the head was stable compared to prior.  Assessment/Plan:   Principal Problem:  Acute encephalopathy secondary to hyponatremia versus cerebral edema from brain metastasis - Sodium 119---> 125 with IV fluids, salt tablets and demeclocycline. Continue. Not much improvement from yesterday.  - CT of the head stable. Likely has underlying vasogenic edema. Continue Decadron. - Dr. Lisbeth Long and Dr. Julien Long has been notified of the patient's admission. - Mental status improving. Advance diet. - wife upset that patient has been saying mean things to her.    Active Problems:  Small cell lung cancer ([place]) - Discussed case with Dr. Julien Long - plan for chemo outpatient when he re covers from this acute confusion.    Sarcoidosis (HCC)/COPD (chronic obstructive pulmonary disease) (HCC) - Continue Spiriva.    Hypertension - Continue Norvasc, atenolol, Edarbi (substitute Avapro).   Depression - Continue Wellbutrin.    DVT prophylaxis - Lovenox ordered.  Family Communication/Anticipated D/C date and plan/Code Status   Family Communication: Wife updated at the bedside. Disposition Plan: Home when mental status cleared. Anticipated D/C date:   2 days depending on clearing of sensorium, improvement, completion of chemotherapy. Code Status:     Code Status Orders        Start     Ordered   09/06/15 1456  Do not attempt resuscitation (DNR)   Continuous      Question Answer Comment  In the event of cardiac or respiratory ARREST Do not call a "code blue"   In the event of cardiac or respiratory ARREST Do not perform Intubation, CPR, defibrillation or ACLS   In the event of cardiac or respiratory ARREST Use medication by any route, position, wound care, and other measures to relive pain and suffering. May use oxygen, suction and manual treatment of airway obstruction as needed for comfort.      09/06/15 1455    Advance Directive Documentation        Most Recent Value   Type of Advance Directive  Healthcare Power of Attorney, Living will   Pre-existing out of facility DNR order (yellow form or pink MOST form)     "MOST" Form in Place?         IV Access:    Peripheral IV   Procedures and diagnostic studies:   Dg Chest 1 View  09/06/2015  CLINICAL DATA:  64 year old male with new altered mental status. Lung cancer with recently diagnosed metastatic disease to the brain. Subsequent encounter. EXAM: CHEST 1 VIEW COMPARISON:  PET-CT 08/16/2015, and earlier. FINDINGS: Right chest porta cath now in place. Stable cardiac size and mediastinal contours. Stable lung volumes. No pneumothorax, pulmonary edema, pleural effusion. Continued streaky retrocardiac opacity. Increased interstitial markings in both lungs may reflect vascular congestion, no overt edema. IMPRESSION: Stable from recent PET-CT.  No new cardiopulmonary abnormality. Electronically Signed   By: Spencer Long M.D.   On: 09/06/2015 08:30   Ct Head Wo Contrast  09/06/2015  CLINICAL  DATA:  Altered mental status, history of small cell lung carcinoma with intracranial metastatic disease EXAM: CT HEAD WITHOUT CONTRAST TECHNIQUE: Contiguous axial images were obtained from the base of the skull through the vertex without intravenous contrast. COMPARISON:  08/25/2015 FINDINGS: Bony calvarium is intact. No gross soft tissue abnormality is noted. Areas a scatter white matter ischemic change are seen. The  known metastatic lesions are not well appreciated on this exam. No significant white matter edema is noted. Motion artifact somewhat degrades the study. No acute hemorrhage is seen. IMPRESSION: No acute abnormality noted.  Mild chronic ischemic changes are seen. Electronically Signed   By: Spencer Long M.D.   On: 09/06/2015 08:56     Medical Consultants:    Dr. Curt Bears, Oncology  Anti-Infectives:   Anti-infectives    Start     Dose/Rate Route Frequency Ordered Stop   09/06/15 1300  demeclocycline (DECLOMYCIN) tablet 150 mg     150 mg Oral 4 times per day 09/06/15 1219        Subjective:   Spencer Long is more awake and responsive today. He is able to follow some commands,. Requesting his cell phone to call his boss and play games.   Objective:    Filed Vitals:   09/08/15 2218 09/09/15 0555 09/09/15 0959 09/09/15 1336  BP: 137/62 132/67 135/75 135/60  Pulse: 90 84 85 85  Temp: 97.4 F (36.3 C) 97.8 F (36.6 C) 97 F (36.1 C) 97.6 F (36.4 C)  TempSrc: Oral Oral Oral Oral  Resp: '20 20 18 18  '$ Height:      Weight:      SpO2: 94% 95% 97% 97%    Intake/Output Summary (Last 24 hours) at 09/09/15 1737 Last data filed at 09/09/15 1335  Gross per 24 hour  Intake 2492.5 ml  Output   1650 ml  Net  842.5 ml   Filed Weights   09/06/15 1109  Weight: 72 kg (158 lb 11.7 oz)    Exam: Gen:  Restless but more alert Cardiovascular:  Tachycardia, No M/R/G Respiratory:  Lungs CTAB Gastrointestinal:  Abdomen soft, NT/ND, + BS Extremities:  No C/E/C   Data Reviewed:    Labs: Basic Metabolic Panel:  Recent Labs Lab 09/06/15 0817 09/07/15 1010 09/08/15 0513 09/09/15 0545  NA 119* 123* 125* 125*  K 4.5 3.9 3.6 4.4  CL 85* 92* 94* 93*  CO2 24 21* 23 24  GLUCOSE 121* 127* 142* 134*  BUN '14 12 11 13  '$ CREATININE 0.88 0.57* 0.53* 0.57*  CALCIUM 9.2 8.9 8.6* 8.4*   GFR Estimated Creatinine Clearance: 92.1 mL/min (by C-G formula based on Cr of 0.57). Liver  Function Tests:  Recent Labs Lab 09/06/15 0817  AST 37  ALT 26  ALKPHOS 87  BILITOT 1.1  PROT 6.7  ALBUMIN 4.3   CBC:  Recent Labs Lab 09/06/15 0817  WBC 6.5  NEUTROABS 5.3  HGB 12.6*  HCT 33.9*  MCV 83.3  PLT 275   CBG:  Recent Labs Lab 09/06/15 0753  GLUCAP 111*   Sepsis Labs:  Recent Labs Lab 09/06/15 0817 09/06/15 0828  WBC 6.5  --   LATICACIDVEN  --  1.34   Microbiology Recent Results (from the past 240 hour(s))  Urine culture     Status: None   Collection Time: 09/06/15  8:17 AM  Result Value Ref Range Status   Specimen Description URINE, CLEAN CATCH  Final   Special Requests NONE  Final   Culture  Final    NO GROWTH 1 DAY Performed at [facility]    Report Status 09/07/2015 FINAL  Final     Medications:   . amLODipine  10 mg Oral Daily  . aspirin EC  81 mg Oral Daily  . atenolol  25 mg Oral BID  . buPROPion  150 mg Oral BID  . demeclocycline  150 mg Oral 4 times per day  . dexamethasone  4 mg Intravenous 4 times per day  . diclofenac  75 mg Oral BID  . enoxaparin (LOVENOX) injection  40 mg Subcutaneous Q24H  . feeding supplement (ENSURE ENLIVE)  237 mL Oral BID BM  . folic acid  1 mg Oral BID  . irbesartan  300 mg Oral Daily  . multivitamin with minerals  1 tablet Oral Daily  . nicotine  14 mg Transdermal Daily  . pantoprazole  40 mg Oral Daily  . sodium chloride  3 mL Intravenous Q12H  . sodium chloride  1 g Oral TID   Continuous Infusions: . sodium chloride 75 mL/hr at 09/09/15 0825    Time spent: 25  minutes.     LOS: 3 days   [facility]  [facility] Pager [phone]   *Please refer to [email or web address], password [email or web address] to get updated schedule on who will round on this patient, as hospitalists switch teams weekly. If 7PM-7AM, please contact night-coverage at [email or web address], password [email or web address] for any overnight needs.  09/09/2015, 5:37 PM

## 2015-09-09 NOTE — Care Management Important Message (Signed)
Important Message  Patient Details  Name: Spencer Long MRN: [id number] Date of Birth: 1951/05/11   Medicare Important Message Given:  Yes    Camillo Flaming 09/09/2015, 12:13 [place] Message  Patient Details  Name: Spencer Long MRN: [id number] Date of Birth: Sep 29, 1951   Medicare Important Message Given:  Yes    Camillo Flaming 09/09/2015, 12:12 PM

## 2015-09-10 ENCOUNTER — Telehealth: Payer: Self-pay | Admitting: *Deleted

## 2015-09-10 DIAGNOSIS — Z515 Encounter for palliative care: Secondary | ICD-10-CM

## 2015-09-10 LAB — BASIC METABOLIC PANEL
ANION GAP: 10 (ref 5–15)
BUN: 18 mg/dL (ref 6–20)
CO2: 23 mmol/L (ref 22–32)
Calcium: 8.5 mg/dL — ABNORMAL LOW (ref 8.9–10.3)
Chloride: 92 mmol/L — ABNORMAL LOW (ref 101–111)
Creatinine, Ser: 0.58 mg/dL — ABNORMAL LOW (ref 0.61–1.24)
GLUCOSE: 129 mg/dL — AB (ref 65–99)
POTASSIUM: 3.5 mmol/L (ref 3.5–5.1)
Sodium: 125 mmol/L — ABNORMAL LOW (ref 135–145)

## 2015-09-10 NOTE — Evaluation (Signed)
Physical Therapy Evaluation Patient Details Name: Spencer Long MRN: [id number] DOB: [birth year] Today's Date: 09/10/2015   History of Present Illness  Spencer Long is an 64 y.o. male with a PMH of small cell lung cancer with brain metastasis, status post stereotactic radiation therapy under the care of Dr. [name] and chronic hyponatremia on salt tablets, compliant with same, was brought to the hospital via EMS secondary to altered mental status. History in ER was obtained from wife due to pt confusion.   Clinical Impression  Pt presents with decreased strength, balance and activity tolerance. To benefit from continued PT while here and at next location in order to improve strength, balance and safety with mobility.      Follow Up Recommendations SNF (could benefit from ST-SNF, however if wife able to take home recommend HHPT/OT)    Equipment Recommendations   (unclear of what pt has at home already, would benefit from RW if pt agrees to it)    Recommendations for Other Services       Precautions / Restrictions Precautions Precautions: Fall      Mobility  Bed Mobility Overal bed mobility:  (not tested today, pt up in recliner by nursing)                Transfers Overall transfer level: Needs assistance Equipment used: Rolling walker (2 wheeled) Transfers: Sit to/from Stand Sit to Stand: Min assist         General transfer comment: and cues for RW hand placment and safety. Assist for initial balance upon rising due to slightly retropulsion initially.   Ambulation/Gait Ambulation/Gait assistance: Mod assist Ambulation Distance (Feet): 12 Feet Assistive device: Rolling walker (2 wheeled) Gait Pattern/deviations: Ataxic;Decreased step length - right;Decreased step length - left;Narrow base of support     General Gait Details: initially unsteady with standing and first stpes with min loss of balance . Used RW for steady. Trying forward and backward stepping 4  steps each due to pt unsteady to continue forward walkign . Tried standing for 2 minute interval befoer temors /weakness set in and pt had to sit down.   Stairs            Wheelchair Mobility    Modified Rankin (Stroke Patients Only)       Balance Overall balance assessment: Needs assistance         Standing balance support: Bilateral upper extremity supported Standing balance-Leahy Scale: Fair                               Pertinent Vitals/Pain Pain Assessment: No/denies pain    Home Living Family/patient expects to be discharged to:: Private residence (all history obtained from patient (wife not present) unsure of its accuracy. ) Living Arrangements: Spouse/significant other Available Help at Discharge: Family Type of Home: House Home Access: Stairs to enter     Home Layout: Two level;Bed/bath upstairs (pt stated going up stairs was okay, but coming down was difficult) Home Equipment: Cane - single point (unsure if pt has more , he was unsure)      Prior Function Level of Independence: Independent with assistive device(s)         Comments: Pt states he used cane at times. unsure of accuracy of this. Stated he goes to [employer] with one on one workout for his disability. Feels his knees get weak first. Also stated he worked some still, but unsure about his and  driving?? Unsure if wife would be able to care for pt at this level,. pt could be at baseline, however wife not present to ask.      Hand Dominance        Extremity/Trunk Assessment   Upper Extremity Assessment: Defer to OT evaluation           Lower Extremity Assessment: Overall WFL for tasks assessed (noted some tremor like movment patterns in UES and LES)         Communication   Communication: No difficulties  Cognition Arousal/Alertness: Awake/alert Behavior During Therapy: WFL for tasks assessed/performed Overall Cognitive Status: Within Functional Limits for tasks  assessed                      General Comments      Exercises        Assessment/Plan    PT Assessment Patient needs continued PT services  PT Diagnosis Difficulty walking;Generalized weakness   PT Problem List Decreased strength;Decreased activity tolerance;Decreased balance;Decreased mobility;Decreased knowledge of use of DME  PT Treatment Interventions DME instruction;Gait training;Functional mobility training;Stair training;Therapeutic activities;Therapeutic exercise;Patient/family education;Balance training   PT Goals (Current goals can be found in the Care Plan section) Acute Rehab PT Goals Patient Stated Goal: I want to get better and continue my appt with [facility] [place] [facility] with [facility]  PT Goal Formulation: With patient Time For Goal Achievement: 09/24/15 Potential to Achieve Goals: Good    Frequency Min 3X/week   Barriers to discharge   unclear of wife's ability to care for pt at this level    Co-evaluation               End of Session Equipment Utilized During Treatment: Gait belt Activity Tolerance: Patient tolerated treatment well Patient left: in chair;with call bell/phone within reach;with chair alarm set;with family/visitor present Nurse Communication: Mobility status         Time: 1430-1453 PT Time Calculation (min) (ACUTE ONLY): 23 min   Charges:   PT Evaluation $Initial PT Evaluation Tier I: 1 Procedure PT Treatments $Gait Training: 8-22 mins   PT G CodesClide Dales 10/05/15, 3:08 PM  Clide Dales, PT Pager: [phone] 2015-10-05

## 2015-09-10 NOTE — Telephone Encounter (Signed)
TC from patient stating he was confused about upcoming appts. Reviewed next week's appt with him and informed him i would leave a printed calendar of his November appts at the registration desk today -he can pick up today or on Monday when he comes in for appt with Dr. [name]. Pt voiced understanding.

## 2015-09-10 NOTE — Telephone Encounter (Signed)
Oncology Nurse Navigator Documentation  Oncology Nurse Navigator Flowsheets  09/10/2015  Navigator Encounter Type Telephone  Patient Visit Type Follow-up  Treatment Phase Treatment  Barriers/Navigation Needs Education  Education Other  Interventions Coordination of Care  Time Spent with Patient 30

## 2015-09-10 NOTE — Telephone Encounter (Signed)
Oncology Nurse Navigator Documentation  Oncology Nurse Navigator Flowsheets 09/10/2015  Navigator Encounter Type Telephone/I called Ms. Spencer Long to follow up on patient's condition.  I listened as she explained.  He is still confused with low sodium level.  Patient is getting sodium replacement in the hospital.  Ms. Spencer Long also asked about chemo tx plan.  I had discussed with Dr. [name] today and relayed that message.  I stated tx plan evaluation is on a day to day basis.  She verbalized understanding.  She also asked about radiation therapy.  I stated I would update Spencer Long for more information.  She was thankful for the call.   Patient Visit Type Follow-up  Treatment Phase Treatment  Barriers/Navigation Needs Education  Education Other  Interventions Coordination of Care  Time Spent with Patient 40

## 2015-09-10 NOTE — Consult Note (Signed)
Consultation Note Date: 09/10/2015   Patient Name: Spencer Long  DOB: 12-25-1950  MRN: [id number]  Age / Sex: 64 y.o., male  PCP: Maury Dus, MD Referring Physician: Hosie Poisson, MD  Reason for Consultation: Establishing goals of care    Clinical Assessment/Narrative: Met with Spencer Long today and he had some insightful comments and was able to tell me about his cancer and history of chemotherapy. He did tell me that he knows he is dying but is hoping that treatment can give him some more time. He is tearful while speaking of this. He talks of many other subjects along the way but overall he seems to have a good understanding of his current health status. I met with wife, [name], after speaking with him and she is very tearful. She has many fears and concerns about what the future brings. Plan is for him to transition to short term rehab with plan to hopefully return home and she is hopeful he can continue plans for chemotherapy. She understands this may not happen. She is trying to come to terms with his poor prognosis and actually went to investigate services at [facility] today (he is not aware of this). She is requesting the following to help her:  - She is requesting a few minutes to privately speak with Dr. [name] regarding his thoughts on prognosis. She is requesting candor and "just tell me how it is - no need to cherry coat." Contanted nurse navigator for Dr. [name] to help arrange.  - F/U with palliative care via [facility] NP as outpatient or our Monday clinic with Wadie Lessen - Requests meeting with CSW to plan for next steps   Contacts/Participants in Discussion: Primary Decision Maker: Della Goo   Relationship to Patient wife   SUMMARY OF RECOMMENDATIONS  Code Status/Advance Care Planning: DNR    Code Status Orders        Start     Ordered   09/06/15 1456  Do not attempt  resuscitation (DNR)   Continuous    Question Answer Comment  In the event of cardiac or respiratory ARREST Do not call a "code blue"   In the event of cardiac or respiratory ARREST Do not perform Intubation, CPR, defibrillation or ACLS   In the event of cardiac or respiratory ARREST Use medication by any route, position, wound care, and other measures to relive pain and suffering. May use oxygen, suction and manual treatment of airway obstruction as needed for comfort.      09/06/15 1455    Advance Directive Documentation        Most Recent Value   Type of Advance Directive  Healthcare Power of Attorney, Living will   Pre-existing out of facility DNR order (yellow form or pink MOST form)     "MOST" Form in Place?         Symptom Management:   Hyponatremia: managed per primary team.   Weakness: PT to see. For short term rehab.   Decreased appetite: Encourage meals - this is improving a little. Encourage supplements.   Palliative Prophylaxis:   Bowel Regimen and Delirium Protocol   Psycho-social/Spiritual:  Support System: Strong Desire for further Chaplaincy support:yes Additional Recommendations: Caregiving  Support/Resources  Prognosis: Likely < 6 months  Discharge Planning: [facility] for rehab with Palliative care service follow-up   Chief Complaint/ Primary Diagnoses: Present on Admission:  . Hyponatremia . Brain metastasis ([place]) . Sarcoidosis () . COPD (chronic obstructive pulmonary disease) ([place]) .  Small cell lung cancer ([place]) . Acute encephalopathy . Depression . Essential hypertension  I have reviewed the medical record, interviewed the patient and family, and examined the patient. The following aspects are pertinent.  Past Medical History  Diagnosis Date  . Hypertension     under control, has been on med. x 15-20 yrs.  [name] GERD (gastroesophageal reflux disease)   . Infection of elbow 2011    right-no infection at present , but has  skin lesions present  . Sarcoidosis ([place])     "tends to have skin flareups" and presently experiencing now"feet, legs and arms"  . Chronic hyponatremia     Dr. Buddy Duty follows  . Radiation 02/11/15-02/25/15    brain 25 Gy  . DNR no code (do not resuscitate) 08/02/2015  . Lung cancer [facility])    Social History   Social History  . Marital Status: Married    Spouse Name: N/A  . Number of Children: 1  . Years of Education: N/A   Occupational History  . 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 Smoking status: Former Smoker -- 1.50 packs/day for 40 years    Types: Cigarettes  . Smokeless tobacco: Former Systems developer    Quit date: 08/26/2014     Comment: currently using electronic cigarette and nicotine patch  . Alcohol Use: 8.4 oz/week    14 Standard drinks or equivalent per week     Comment: 2-4 drinks daily  . Drug Use: No  . Sexual Activity: Not Asked   Other Topics Concern  . None   Social History Narrative   Lives with wife.   Family History  Problem Relation Age of Onset  . Cancer Father     Leukemia  . Cancer Mother     breast  . Dementia Mother    Scheduled Meds: . amLODipine  10 mg Oral Daily  . aspirin EC  81 mg Oral Daily  . atenolol  25 mg Oral BID  . buPROPion  150 mg Oral BID  . demeclocycline  150 mg Oral 4 times per day  . dexamethasone  4 mg Intravenous 4 times per day  . diclofenac  75 mg Oral BID  . enoxaparin (LOVENOX) injection  40 mg Subcutaneous Q24H  . feeding supplement (ENSURE ENLIVE)  237 mL Oral BID BM  . folic acid  1 mg Oral BID  . irbesartan  300 mg Oral Daily  . multivitamin with minerals  1 tablet Oral Daily  . nicotine  14 mg Transdermal Daily  . pantoprazole  40 mg Oral Daily  . sodium chloride  3 mL Intravenous Q12H  . sodium chloride  1 g Oral TID  . thiamine  100 mg Oral Daily   Or  . thiamine  100 mg Intravenous Daily   Continuous Infusions: . sodium chloride 100 mL/hr at 09/10/15 0629    PRN Meds:.acetaminophen **OR** acetaminophen, dicyclomine, haloperidol lactate, LORazepam **OR** LORazepam, ondansetron **OR** ondansetron (ZOFRAN) IV, polyethylene glycol, sodium chloride Medications Prior to Admission:  Prior to Admission medications   Medication Sig Start Date End Date Taking? Authorizing Provider  amLODipine (NORVASC) 10 MG tablet Take 10 mg by mouth every morning.  07/23/14  Yes Historical Provider, MD  aspirin EC 81 MG tablet Take 81 mg by mouth every morning.    Yes Historical Provider, MD  atenolol (TENORMIN) 25 MG tablet Take 25 mg by mouth 2 (two) times daily.   Yes Historical Provider, MD  Azilsartan Medoxomil (EDARBI) 80 MG TABS Take 80 mg by mouth daily.    Yes Historical Provider, MD  buPROPion (WELLBUTRIN SR) 150 MG 12 hr tablet Take 1 tablet (150 mg total) by mouth 2 (two) times daily. 08/24/15  Yes Collene Gobble, MD  diclofenac (VOLTAREN) 75 MG EC tablet Take 75 mg by mouth 2 (two) times daily.   Yes Historical Provider, MD  dicyclomine (BENTYL) 20 MG tablet Take 20 mg by mouth every 4 (four) hours as needed for spasms.  08/02/14  Yes Historical Provider, MD  folic acid (FOLVITE) 1 MG tablet Take 1 mg by mouth 2 (two) times daily. 07/06/14  Yes Historical Provider, MD  lidocaine-prilocaine (EMLA) cream Apply 1 application topically as needed. 08/17/15  Yes Curt Bears, MD  loperamide (IMODIUM) 2 MG capsule Take 2 mg by mouth as needed for diarrhea or loose stools.   Yes Historical Provider, MD  Multiple Vitamins-Minerals (MULTIVITAMIN ADULTS 50+ PO) Take 1 tablet by mouth every morning.    Yes Historical Provider, MD  mupirocin cream (BACTROBAN) 2 % Apply 1 application topically 2 (two) times daily.   Yes Historical Provider, MD  nicotine (NICODERM CQ - DOSED IN MG/24 HOURS) 14 mg/24hr patch Place 1 patch (14 mg total) onto the skin daily. 11/27/14  Yes Collene Gobble, MD  omeprazole (PRILOSEC) 40 MG capsule Take 40 mg by mouth every morning.   Yes Historical  Provider, MD  Probiotic Product (PROBIOTIC DAILY PO) Take 1 tablet by mouth every morning.    Yes Historical Provider, MD  sodium chloride 1 G tablet Take 1 g by mouth 3 (three) times daily.   Yes Historical Provider, MD  Tiotropium Bromide Monohydrate (SPIRIVA RESPIMAT) 1.25 MCG/ACT AERS Inhale 2 puffs into the lungs daily.   Yes Historical Provider, MD  white petrolatum (VASELINE) GEL Apply 1 application topically daily as needed for lip care or dry skin.   Yes Historical Provider, MD  Albuterol Sulfate (PROAIR RESPICLICK) 338 (90 BASE) MCG/ACT AEPB Inhale 2 puffs into the lungs every 4 (four) hours as needed. Patient not taking: Reported on 09/06/2015 05/21/15   Collene Gobble, MD  nicotine (NICODERM CQ - DOSED IN MG/24 HR) 7 mg/24hr patch Place 1 patch (7 mg total) onto the skin daily. Patient not taking: Reported on 09/06/2015 07/22/15   Collene Gobble, MD  prochlorperazine (COMPAZINE) 10 MG tablet Take 1 tablet (10 mg total) by mouth every 6 (six) hours as needed for nausea or vomiting. Patient not taking: Reported on 09/06/2015 08/17/15   Curt Bears, MD   Allergies  Allergen Reactions  . Varenicline     Chantix - caused strange behavior  . Isothiazolinone Chloride Rash  . Oxycodone Rash  . Quaternium-15 Rash    Review of Systems  Physical Exam  Vital Signs: BP 123/94 mmHg  Pulse 87  Temp(Src) 97.6 F (36.4 C) (Oral)  Resp 18  Ht _0  (1.753 m)  Wt 72 kg (158 lb 11.7 oz)  BMI 23.43 kg/m2  SpO2 100%  SpO2: SpO2: 100 % O2 Device:SpO2: 100 % O2 Flow Rate: .   IO: Intake/output summary:  Intake/Output Summary (Last 24 hours) at 09/10/15 1532 Last data filed at 09/10/15 0854  Gross per 24 hour  Intake 2545.83 ml  Output   1800 ml  Net 745.83 ml    LBM: Last BM Date: 09/09/15 Baseline Weight: Weight: 72 kg (158 lb 11.7 oz) Most recent weight: Weight: 72 kg (158 lb 11.7 oz)  Palliative Assessment/Data:  Flowsheet Rows        Most Recent Value   Intake Tab      Referral Department  Hospitalist   Unit at Time of Referral  Cardiac/Telemetry Unit   Palliative Care Primary Diagnosis  Cancer   Date Notified  09/10/15   Palliative Care Type  New Palliative care   Reason for referral  Clarify Goals of Care   Date of Admission  09/06/15   # of days IP prior to Palliative referral  4   Clinical Assessment    Psychosocial & Spiritual Assessment    Palliative Care Outcomes       Additional Data Reviewed:  CBC:    Component Value Date/Time   WBC 6.5 09/06/2015 0817   WBC 5.2 07/29/2015 0827   HGB 12.6* 09/06/2015 0817   HGB 12.7* 07/29/2015 0827   HCT 33.9* 09/06/2015 0817   HCT 35.8* 07/29/2015 0827   PLT 275 09/06/2015 0817   PLT 314 07/29/2015 0827   MCV 83.3 09/06/2015 0817   MCV 89.4 07/29/2015 0827   NEUTROABS 5.3 09/06/2015 0817   NEUTROABS 3.8 07/29/2015 0827   LYMPHSABS 0.4* 09/06/2015 0817   LYMPHSABS 0.5* 07/29/2015 0827   MONOABS 0.8 09/06/2015 0817   MONOABS 0.8 07/29/2015 0827   EOSABS 0.0 09/06/2015 0817   EOSABS 0.2 07/29/2015 0827   BASOSABS 0.0 09/06/2015 0817   BASOSABS 0.0 07/29/2015 0827   Comprehensive Metabolic Panel:    Component Value Date/Time   NA 125* 09/10/2015 1055   NA 123* 07/29/2015 0827   K 3.5 09/10/2015 1055   K 5.6* 07/29/2015 0827   CL 92* 09/10/2015 1055   CO2 23 09/10/2015 1055   CO2 23 07/29/2015 0827   BUN 18 09/10/2015 1055   BUN 13.5 07/29/2015 0827   CREATININE 0.58* 09/10/2015 1055   CREATININE 0.8 07/29/2015 0827   GLUCOSE 129* 09/10/2015 1055   GLUCOSE 96 07/29/2015 0827   CALCIUM 8.5* 09/10/2015 1055   CALCIUM 9.5 07/29/2015 0827   AST 37 09/06/2015 0817   AST 15 07/29/2015 0827   ALT 26 09/06/2015 0817   ALT 18 07/29/2015 0827   ALKPHOS 87 09/06/2015 0817   ALKPHOS 80 07/29/2015 0827   BILITOT 1.1 09/06/2015 0817   BILITOT 0.78 07/29/2015 0827   PROT 6.7 09/06/2015 0817   PROT 6.6 07/29/2015 0827   ALBUMIN 4.3 09/06/2015 0817   ALBUMIN 4.2 07/29/2015 0827      Time In: 1450 Time Out: 1620 Time Total: 75mn Greater than 50%  of this time was spent counseling and coordinating care related to the above assessment and plan.  Signed by: PPershing Proud NP  APershing Proud NP  141/[id number] 3:32 PM  Please contact Palliative Medicine Team phone at [phone] questions and concerns.

## 2015-09-10 NOTE — Telephone Encounter (Signed)
Pt called states he's in [place] and they have to get his sodium down before he can get back to the other hospital. Pt is requesting help with his login for mychart. Attempt to reorient pt that he is in [facility] hospital. Pt denies. Informed pt I will call a nurse to check on him. Pt is currently admitted to [facility]. Notified Ron on 4W of pt's confusion and requested pt to be checked on. Ron verbalized he would inform pt's nurse.

## 2015-09-10 NOTE — Progress Notes (Signed)
Progress Note   ERIK BURKETT NTI:[id number] DOB: 1951-01-29 DOA: 09/06/2015 PCP: [name], MD   Brief Narrative:   Spencer Long is an 64 y.o. male with a PMH of small cell lung cancer with brain metastasis, status post stereotactic radiation therapy under the care of Dr. [name] and chronic hyponatremia on salt tablets, compliant with same, was brought to the hospital 09/06/15 via EMS secondary to altered mental status. Upon initial evaluation in the ED, the patient was noted to have a sodium of 119. CT of the head was stable compared to prior.  Assessment/Plan:   Principal Problem:  Acute encephalopathy secondary to hyponatremia versus cerebral edema from brain metastasis - Sodium 119---> 125 with IV fluids, salt tablets and demeclocycline. Continue. Not much improvement from yesterday in the sodium level but his encephalopathy is improving.  - CT of the head stable. Likely has underlying vasogenic edema. Continue Decadron. - Dr. [name] and Dr. [name] has been notified of the patient's admission. - Mental status improving. Advance diet. - palliative care consulted as per family's request.    Active Problems:  Small cell lung cancer ([place]) - Discussed case with Dr. [name] - plan for chemo outpatient when he re covers from this acute confusion.    Sarcoidosis (HCC)/COPD (chronic obstructive pulmonary disease) (HCC) - Continue Spiriva.    Hypertension - Continue Norvasc, atenolol, Edarbi (substitute Avapro).   Depression - Continue Wellbutrin.    DVT prophylaxis - Lovenox ordered.  Family Communication/Anticipated D/C date and plan/Code Status   Family Communication: Wife updated at the bedside. Disposition Plan: SNF, when his confusion improves.  Anticipated D/C date:  Possibly Monday.  Code Status:     Code Status Orders        Start     Ordered   09/06/15 1456  Do not attempt resuscitation (DNR)   Continuous    Question Answer  Comment  In the event of cardiac or respiratory ARREST Do not call a "code blue"   In the event of cardiac or respiratory ARREST Do not perform Intubation, CPR, defibrillation or ACLS   In the event of cardiac or respiratory ARREST Use medication by any route, position, wound care, and other measures to relive pain and suffering. May use oxygen, suction and manual treatment of airway obstruction as needed for comfort.      09/06/15 1455    Advance Directive Documentation        Most Recent Value   Type of Advance Directive  Healthcare Power of Attorney, Living will   Pre-existing out of facility DNR order (yellow form or pink MOST form)     "MOST" Form in Place?         IV Access:    Peripheral IV   Procedures and diagnostic studies:   Dg Chest 1 View  09/06/2015  CLINICAL DATA:  64 year old male with new altered mental status. Lung cancer with recently diagnosed metastatic disease to the brain. Subsequent encounter. EXAM: CHEST 1 VIEW COMPARISON:  PET-CT 08/16/2015, and earlier. FINDINGS: Right chest porta cath now in place. Stable cardiac size and mediastinal contours. Stable lung volumes. No pneumothorax, pulmonary edema, pleural effusion. Continued streaky retrocardiac opacity. Increased interstitial markings in both lungs may reflect vascular congestion, no overt edema. IMPRESSION: Stable from recent PET-CT.  No new cardiopulmonary abnormality. Electronically Signed   By: [name] M.D.   On: 09/06/2015 08:30   Ct Head Wo Contrast  09/06/2015  CLINICAL DATA:  Altered mental  status, history of small cell lung carcinoma with intracranial metastatic disease EXAM: CT HEAD WITHOUT CONTRAST TECHNIQUE: Contiguous axial images were obtained from the base of the skull through the vertex without intravenous contrast. COMPARISON:  08/25/2015 FINDINGS: Bony calvarium is intact. No gross soft tissue abnormality is noted. Areas a scatter white matter ischemic change are seen. The known metastatic  lesions are not well appreciated on this exam. No significant white matter edema is noted. Motion artifact somewhat degrades the study. No acute hemorrhage is seen. IMPRESSION: No acute abnormality noted.  Mild chronic ischemic changes are seen. Electronically Signed   By: [name] M.D.   On: 09/06/2015 08:56     Medical Consultants:    Dr. Curt Bears, Oncology  Anti-Infectives:   Anti-infectives    Start     Dose/Rate Route Frequency Ordered Stop   09/06/15 1300  demeclocycline (DECLOMYCIN) tablet 150 mg     150 mg Oral 4 times per day 09/06/15 1219        Subjective:   Spencer Long is more awake and responsive today. He is able to follow some commands,. He knows he is in the hospital and he is dying.  But he reports he wants to go home.   Objective:    Filed Vitals:   09/09/15 2035 09/09/15 2320 09/10/15 0627 09/10/15 1347  BP: 124/79 128/62 147/81 123/94  Pulse: 85 76 90 87  Temp: 97.6 F (36.4 C)  97.7 F (36.5 C) 97.6 F (36.4 C)  TempSrc: Oral  Oral Oral  Resp: '18  18 18  '$ Height:      Weight:      SpO2: 96%  96% 100%    Intake/Output Summary (Last 24 hours) at 09/10/15 1720 Last data filed at 09/10/15 0854  Gross per 24 hour  Intake 2545.83 ml  Output   1800 ml  Net 745.83 ml   Filed Weights   09/06/15 1109  Weight: 72 kg (158 lb 11.7 oz)    Exam: Gen:  Restless but more alert Cardiovascular:  Tachycardia, No M/R/G Respiratory:  Lungs CTAB Gastrointestinal:  Abdomen soft, NT/ND, + BS Extremities:  No C/E/C   Data Reviewed:    Labs: Basic Metabolic Panel:  Recent Labs Lab 09/06/15 0817 09/07/15 1010 09/08/15 0513 09/09/15 0545 09/10/15 1055  NA 119* 123* 125* 125* 125*  K 4.5 3.9 3.6 4.4 3.5  CL 85* 92* 94* 93* 92*  CO2 24 21* '23 24 23  '$ GLUCOSE 121* 127* 142* 134* 129*  BUN '14 12 11 13 18  '$ CREATININE 0.88 0.57* 0.53* 0.57* 0.58*  CALCIUM 9.2 8.9 8.6* 8.4* 8.5*   GFR Estimated Creatinine Clearance: 92.1 mL/min (by  C-G formula based on Cr of 0.58). Liver Function Tests:  Recent Labs Lab 09/06/15 0817  AST 37  ALT 26  ALKPHOS 87  BILITOT 1.1  PROT 6.7  ALBUMIN 4.3   CBC:  Recent Labs Lab 09/06/15 0817  WBC 6.5  NEUTROABS 5.3  HGB 12.6*  HCT 33.9*  MCV 83.3  PLT 275   CBG:  Recent Labs Lab 09/06/15 0753  GLUCAP 111*   Sepsis Labs:  Recent Labs Lab 09/06/15 0817 09/06/15 0828  WBC 6.5  --   LATICACIDVEN  --  1.34   Microbiology Recent Results (from the past 240 hour(s))  Urine culture     Status: None   Collection Time: 09/06/15  8:17 AM  Result Value Ref Range Status   Specimen Description URINE, CLEAN CATCH  Final   Special Requests NONE  Final   Culture   Final    NO GROWTH 1 DAY Performed at [facility]    Report Status 09/07/2015 FINAL  Final     Medications:   . amLODipine  10 mg Oral Daily  . aspirin EC  81 mg Oral Daily  . atenolol  25 mg Oral BID  . buPROPion  150 mg Oral BID  . demeclocycline  150 mg Oral 4 times per day  . dexamethasone  4 mg Intravenous 4 times per day  . diclofenac  75 mg Oral BID  . enoxaparin (LOVENOX) injection  40 mg Subcutaneous Q24H  . feeding supplement (ENSURE ENLIVE)  237 mL Oral BID BM  . folic acid  1 mg Oral BID  . irbesartan  300 mg Oral Daily  . multivitamin with minerals  1 tablet Oral Daily  . nicotine  14 mg Transdermal Daily  . pantoprazole  40 mg Oral Daily  . sodium chloride  3 mL Intravenous Q12H  . sodium chloride  1 g Oral TID  . thiamine  100 mg Oral Daily   Or  . thiamine  100 mg Intravenous Daily   Continuous Infusions: . sodium chloride 100 mL/hr at 09/10/15 1708    Time spent: 25  minutes.     LOS: 4 days   [facility]  [facility] Pager [phone]   *Please refer to [email or web address], password [email or web address] to get updated schedule on who will round on this patient, as hospitalists switch teams weekly. If 7PM-7AM, please contact night-coverage at [email or web address], password [email or web address] for any  overnight needs.  09/10/2015, 5:20 PM

## 2015-09-10 NOTE — Progress Notes (Signed)
Palliative Consult:  Full note to follow. Met with Spencer Long today and he had some insightful comments and was able to tell me about his cancer and history of chemotherapy. He did tell me that he knows he is dying but is hoping that treatment can give him some more time. He is tearful while speaking of this. He talks of many other subjects along the way but overall he seems to have a good understanding of his current health status. I met with wife, Spencer Long, after speaking with him and she is very tearful. She has many fears and concerns about what the future brings. Plan is for him to transition to short term rehab with plan to hopefully return home and she is hopeful he can continue plans for chemotherapy. She understands this may not happen. She is trying to come to terms with his poor prognosis and actually went to investigate services at [facility] today (he is not aware of this). She is requesting the following to help her:  - She is requesting a few minutes to privately speak with Dr. [name] regarding his thoughts on prognosis. She is requesting candor and "just tell me how it is - no need to cherry coat." - F/U with palliative care via [facility] NP as outpatient or our Monday clinic with Wadie Lessen - Requests meeting with CSW to plan for next steps  I will f/u Monday. Please call for any acute palliative needs over the weekend.   Vinie Sill, NP Palliative Medicine Team Pager # [phone] (M-F 8a-5p) Team Phone # [phone] (Nights/Weekends)

## 2015-09-11 LAB — BASIC METABOLIC PANEL
Anion gap: 10 (ref 5–15)
BUN: 16 mg/dL (ref 6–20)
CHLORIDE: 91 mmol/L — AB (ref 101–111)
CO2: 25 mmol/L (ref 22–32)
CREATININE: 0.52 mg/dL — AB (ref 0.61–1.24)
Calcium: 8.6 mg/dL — ABNORMAL LOW (ref 8.9–10.3)
GFR calc Af Amer: >60 mL/min (ref >60)
GFR calc non Af Amer: >60 mL/min (ref >60)
GLUCOSE: 117 mg/dL — AB (ref 65–99)
Potassium: 3.4 mmol/L — ABNORMAL LOW (ref 3.5–5.1)
SODIUM: 126 mmol/L — AB (ref 135–145)

## 2015-09-11 MED ORDER — POTASSIUM CHLORIDE CRYS ER 20 MEQ PO TBCR
40.0000 meq | EXTENDED_RELEASE_TABLET | Freq: Once | ORAL | Status: AC
  Administered 2015-09-11: 40 meq via ORAL
  Filled 2015-09-11: qty 2

## 2015-09-11 NOTE — Progress Notes (Signed)
Progress Note   Spencer Long QQP:[id number] DOB: 1951/09/18 DOA: 09/06/2015 PCP: [name], MD   Brief Narrative:   Spencer Long is an 64 y.o. male with a PMH of small cell lung cancer with brain metastasis, status post stereotactic radiation therapy under the care of Dr. [name] and chronic hyponatremia on salt tablets, compliant with same, was brought to the hospital 09/06/15 via EMS secondary to altered mental status. Upon initial evaluation in the ED, the patient was noted to have a sodium of 119. CT of the head was stable compared to prior.  Assessment/Plan:   Principal Problem:  Acute encephalopathy secondary to hyponatremia versus cerebral edema from brain metastasis - Sodium 119---> 126 with IV fluids, salt tablets and demeclocycline. Continue. Not much improvement from yesterday in the sodium level but his encephalopathy is improving.  - CT of the head stable. Likely has underlying vasogenic edema. Continue Decadron. - Dr. [name] and Dr. [name] has been notified of the patient's admission. - Mental status improving. Advance diet. - palliative care consulted as per family's request.    Active Problems:  Small cell lung cancer ([place]) - Discussed case with Dr. [name] - plan for chemo outpatient when he re covers from this acute confusion.    Sarcoidosis (HCC)/COPD (chronic obstructive pulmonary disease) (HCC) - Continue Spiriva.    Hypertension - Continue Norvasc, atenolol, Edarbi (substitute Avapro).   Depression - Continue Wellbutrin.    DVT prophylaxis - Lovenox ordered.  Family Communication/Anticipated D/C date and plan/Code Status   Family Communication: Wife updated at the bedside. Disposition Plan: SNF, when his confusion improves.  Anticipated D/C date:  Possibly Monday.  Code Status:     Code Status Orders        Start     Ordered   09/06/15 1456  Do not attempt resuscitation (DNR)   Continuous    Question Answer  Comment  In the event of cardiac or respiratory ARREST Do not call a "code blue"   In the event of cardiac or respiratory ARREST Do not perform Intubation, CPR, defibrillation or ACLS   In the event of cardiac or respiratory ARREST Use medication by any route, position, wound care, and other measures to relive pain and suffering. May use oxygen, suction and manual treatment of airway obstruction as needed for comfort.      09/06/15 1455    Advance Directive Documentation        Most Recent Value   Type of Advance Directive  Healthcare Power of Attorney, Living will   Pre-existing out of facility DNR order (yellow form or pink MOST form)     "MOST" Form in Place?         IV Access:    Peripheral IV   Procedures and diagnostic studies:   Dg Chest 1 View  09/06/2015  CLINICAL DATA:  64 year old male with new altered mental status. Lung cancer with recently diagnosed metastatic disease to the brain. Subsequent encounter. EXAM: CHEST 1 VIEW COMPARISON:  PET-CT 08/16/2015, and earlier. FINDINGS: Right chest porta cath now in place. Stable cardiac size and mediastinal contours. Stable lung volumes. No pneumothorax, pulmonary edema, pleural effusion. Continued streaky retrocardiac opacity. Increased interstitial markings in both lungs may reflect vascular congestion, no overt edema. IMPRESSION: Stable from recent PET-CT.  No new cardiopulmonary abnormality. Electronically Signed   By: [name] M.D.   On: 09/06/2015 08:30   Ct Head Wo Contrast  09/06/2015  CLINICAL DATA:  Altered mental  status, history of small cell lung carcinoma with intracranial metastatic disease EXAM: CT HEAD WITHOUT CONTRAST TECHNIQUE: Contiguous axial images were obtained from the base of the skull through the vertex without intravenous contrast. COMPARISON:  08/25/2015 FINDINGS: Bony calvarium is intact. No gross soft tissue abnormality is noted. Areas a scatter white matter ischemic change are seen. The known metastatic  lesions are not well appreciated on this exam. No significant white matter edema is noted. Motion artifact somewhat degrades the study. No acute hemorrhage is seen. IMPRESSION: No acute abnormality noted.  Mild chronic ischemic changes are seen. Electronically Signed   By: [name] M.D.   On: 09/06/2015 08:56     Medical Consultants:    Dr. Curt Bears, Oncology  Anti-Infectives:   Anti-infectives    Start     Dose/Rate Route Frequency Ordered Stop   09/06/15 1300  demeclocycline (DECLOMYCIN) tablet 150 mg     150 mg Oral 4 times per day 09/06/15 1219        Subjective:   Spencer Long denies anynew complaints.   Objective:    Filed Vitals:   09/10/15 1347 09/10/15 2048 09/11/15 0510 09/11/15 1347  BP: 123/94 133/69 130/73 144/64  Pulse: 87 82 72 83  Temp: 97.6 F (36.4 C) 98.3 F (36.8 C) 98 F (36.7 C) 98 F (36.7 C)  TempSrc: Oral Oral Oral Oral  Resp: '18 18 18 18  '$ Height:      Weight:      SpO2: 100% 96% 98% 97%    Intake/Output Summary (Last 24 hours) at 09/11/15 1830 Last data filed at 09/11/15 0413  Gross per 24 hour  Intake   1840 ml  Output   1950 ml  Net   -110 ml   Filed Weights   09/06/15 1109  Weight: 72 kg (158 lb 11.7 oz)    Exam: Gen:  Restless but more alert Cardiovascular:  Tachycardia, No M/R/G Respiratory:  Lungs CTAB Gastrointestinal:  Abdomen soft, NT/ND, + BS Extremities:  No C/E/C   Data Reviewed:    Labs: Basic Metabolic Panel:  Recent Labs Lab 09/07/15 1010 09/08/15 0513 09/09/15 0545 09/10/15 1055 09/11/15 0930  NA 123* 125* 125* 125* 126*  K 3.9 3.6 4.4 3.5 3.4*  CL 92* 94* 93* 92* 91*  CO2 21* '23 24 23 25  '$ GLUCOSE 127* 142* 134* 129* 117*  BUN '12 11 13 18 16  '$ CREATININE 0.57* 0.53* 0.57* 0.58* 0.52*  CALCIUM 8.9 8.6* 8.4* 8.5* 8.6*   GFR Estimated Creatinine Clearance: 92.1 mL/min (by C-G formula based on Cr of 0.52). Liver Function Tests:  Recent Labs Lab 09/06/15 0817  AST 37  ALT 26    ALKPHOS 87  BILITOT 1.1  PROT 6.7  ALBUMIN 4.3   CBC:  Recent Labs Lab 09/06/15 0817  WBC 6.5  NEUTROABS 5.3  HGB 12.6*  HCT 33.9*  MCV 83.3  PLT 275   CBG:  Recent Labs Lab 09/06/15 0753  GLUCAP 111*   Sepsis Labs:  Recent Labs Lab 09/06/15 0817 09/06/15 0828  WBC 6.5  --   LATICACIDVEN  --  1.34   Microbiology Recent Results (from the past 240 hour(s))  Urine culture     Status: None   Collection Time: 09/06/15  8:17 AM  Result Value Ref Range Status   Specimen Description URINE, CLEAN CATCH  Final   Special Requests NONE  Final   Culture   Final    NO GROWTH 1 DAY Performed  at [facility]    Report Status 09/07/2015 FINAL  Final     Medications:   . amLODipine  10 mg Oral Daily  . aspirin EC  81 mg Oral Daily  . atenolol  25 mg Oral BID  . buPROPion  150 mg Oral BID  . demeclocycline  150 mg Oral 4 times per day  . dexamethasone  4 mg Intravenous 4 times per day  . diclofenac  75 mg Oral BID  . enoxaparin (LOVENOX) injection  40 mg Subcutaneous Q24H  . feeding supplement (ENSURE ENLIVE)  237 mL Oral BID BM  . folic acid  1 mg Oral BID  . irbesartan  300 mg Oral Daily  . multivitamin with minerals  1 tablet Oral Daily  . nicotine  14 mg Transdermal Daily  . pantoprazole  40 mg Oral Daily  . sodium chloride  3 mL Intravenous Q12H  . sodium chloride  1 g Oral TID  . thiamine  100 mg Oral Daily   Continuous Infusions: . sodium chloride 100 mL/hr at 09/10/15 1708    Time spent: 25  minutes.     LOS: 5 days   [facility]  [facility] Pager [phone]   *Please refer to [email or web address], password [email or web address] to get updated schedule on who will round on this patient, as hospitalists switch teams weekly. If 7PM-7AM, please contact night-coverage at [email or web address], password [email or web address] for any overnight needs.  09/11/2015, 6:30 PM

## 2015-09-11 NOTE — Clinical Social Work Note (Addendum)
CSW spent time talking with patient and his wife, patient and family agreed to consider going to SNF for short term rehab.  Patient has been faxed out, CSW to continue to follow patient's progress.  DNR on patient's chart awaiting signature by physician.  3:58 pm DNR has been signed by physician on patient's chart.  Jones Broom. [place]ron, MSW, [place] 09/11/2015 3:31 PM

## 2015-09-11 NOTE — NC FL2 (Signed)
[place] LEVEL OF CARE SCREENING TOOL     IDENTIFICATION  Patient Name: Spencer Long Birthdate: January 21, 1951 Sex: male Admission Date (Current Location): 09/06/2015  [facility] and [place] Number: Herbalist and Address:  [facility] [place] [address] [place], [place] [address]      Provider Number: [id number]  Attending Physician Name and Address:  Hosie Poisson, MD  Relative Name and Phone Number:   Jessy Cybulski [phone]    Current Level of Care: Hospital Recommended Level of Care: [place] Prior Approval Number:    Date Approved/Denied:   PASRR Number: [id number]  Discharge Plan: SNF    Current Diagnoses: Patient Active Problem List   Diagnosis Date Noted  . Hyponatremia 09/06/2015  . Brain metastasis ([place]) 09/06/2015  . Acute encephalopathy 09/06/2015  . Depression 09/06/2015  . Essential hypertension 09/06/2015  . DNR no code (do not resuscitate) 08/02/2015  . COPD (chronic obstructive pulmonary disease) ([place]) 05/21/2015  . Sarcoidosis ([place]) 03/25/2015  . Small cell lung cancer ([place]) 08/31/2014  . History of tobacco abuse 08/11/2014    Orientation ACTIVITIES/SOCIAL BLADDER RESPIRATION    Self, Time, Situation, Place    Incontinent Normal  BEHAVIORAL SYMPTOMS/MOOD NEUROLOGICAL BOWEL NUTRITION STATUS      Incontinent Diet  PHYSICIAN VISITS COMMUNICATION OF NEEDS Height & Weight Skin    Verbally '5\' 8"'$  (172.7 cm) 143 lbs. Surgical wounds          AMBULATORY STATUS RESPIRATION    Assist extensive Normal      Personal Care Assistance Level of Assistance  Bathing, Dressing Bathing Assistance: Limited assistance   Dressing Assistance: Limited assistance      Functional Limitations Info  Sight Sight Info: Adequate           SPECIAL CARE FACTORS FREQUENCY  PT (By licensed PT)     PT Frequency: 5x             Additional Factors Info  Code Status, Allergies,  Psychotropic Code Status Info: DNR Allergies Info: VARENICLINE, ISOTHIAZOLINONE CHLORIDE, OXYCODONE, QUATERNIUM-15 Psychotropic Info: medications         Current Medications (09/11/2015): Current Facility-Administered Medications  Medication Dose Route Frequency Provider Last Rate Last Dose  . 0.9 %  sodium chloride infusion   Intravenous Continuous Hosie Poisson, MD 100 mL/hr at 09/10/15 1708    . acetaminophen (TYLENOL) tablet 650 mg  650 mg Oral Q6H PRN Venetia Maxon Rama, MD       Or  . acetaminophen (TYLENOL) suppository 650 mg  650 mg Rectal Q6H PRN Venetia Maxon Rama, MD   650 mg at 09/06/15 1511  . amLODipine (NORVASC) tablet 10 mg  10 mg Oral Daily Venetia Maxon Rama, MD   10 mg at 09/11/15 1029  . aspirin EC tablet 81 mg  81 mg Oral Daily Venetia Maxon Rama, MD   81 mg at 09/11/15 1028  . atenolol (TENORMIN) tablet 25 mg  25 mg Oral BID Venetia Maxon Rama, MD   25 mg at 09/11/15 1029  . buPROPion (WELLBUTRIN SR) 12 hr tablet 150 mg  150 mg Oral BID Venetia Maxon Rama, MD   150 mg at 09/11/15 1028  . demeclocycline (DECLOMYCIN) tablet 150 mg  150 mg Oral 4 times per day Venetia Maxon Rama, MD   150 mg at 09/11/15 1248  . dexamethasone (DECADRON) injection 4 mg  4 mg Intravenous 4 times per day Venetia Maxon Rama, MD   4 mg  at 09/11/15 1250  . diclofenac (VOLTAREN) EC tablet 75 mg  75 mg Oral BID Venetia Maxon Rama, MD   75 mg at 09/11/15 1029  . dicyclomine (BENTYL) tablet 20 mg  20 mg Oral Q4H PRN Christina P Rama, MD      . enoxaparin (LOVENOX) injection 40 mg  40 mg Subcutaneous Q24H Venetia Maxon Rama, MD   40 mg at 09/11/15 1402  . feeding supplement (ENSURE ENLIVE) (ENSURE ENLIVE) liquid 237 mL  237 mL Oral BID BM Satira Anis Ward, RD   237 mL at 09/11/15 1000  . folic acid (FOLVITE) tablet 1 mg  1 mg Oral BID Venetia Maxon Rama, MD   1 mg at 09/11/15 1029  . haloperidol lactate (HALDOL) injection 2 mg  2 mg Intravenous Q6H PRN Hosie Poisson, MD      . irbesartan (AVAPRO) tablet 300 mg  300 mg Oral  Daily Venetia Maxon Rama, MD   300 mg at 09/11/15 1028  . LORazepam (ATIVAN) tablet 1 mg  1 mg Oral Q6H PRN Hosie Poisson, MD   1 mg at 09/10/15 0629   Or  . LORazepam (ATIVAN) injection 1 mg  1 mg Intravenous Q6H PRN Hosie Poisson, MD      . multivitamin with minerals tablet 1 tablet  1 tablet Oral Daily Venetia Maxon Rama, MD   1 tablet at 09/11/15 1029  . nicotine (NICODERM CQ - dosed in mg/24 hours) patch 14 mg  14 mg Transdermal Daily Venetia Maxon Rama, MD   14 mg at 09/11/15 1029  . ondansetron (ZOFRAN) tablet 4 mg  4 mg Oral Q6H PRN Christina P Rama, MD       Or  . ondansetron (ZOFRAN) injection 4 mg  4 mg Intravenous Q6H PRN Christina P Rama, MD      . pantoprazole (PROTONIX) EC tablet 40 mg  40 mg Oral Daily Venetia Maxon Rama, MD   40 mg at 09/11/15 1028  . polyethylene glycol (MIRALAX / GLYCOLAX) packet 17 g  17 g Oral Daily PRN Christina P Rama, MD      . sodium chloride 0.9 % injection 10-40 mL  10-40 mL Intracatheter PRN Venetia Maxon Rama, MD   10 mL at 09/09/15 0545  . sodium chloride 0.9 % injection 3 mL  3 mL Intravenous Q12H Venetia Maxon Rama, MD   3 mL at 09/07/15 1000  . sodium chloride tablet 1 g  1 g Oral TID Venetia Maxon Rama, MD   1 g at 09/11/15 1028  . thiamine (VITAMIN B-1) tablet 100 mg  100 mg Oral Daily Hosie Poisson, MD   100 mg at 09/11/15 1028   Or  . thiamine (B-1) injection 100 mg  100 mg Intravenous Daily Hosie Poisson, MD       Do not use this list as official medication orders. Please verify with discharge summary.  Discharge Medications:   Medication List    ASK your doctor about these medications        Albuterol Sulfate 108 (90 BASE) MCG/ACT Aepb  Commonly known as:  PROAIR RESPICLICK  Inhale 2 puffs into the lungs every 4 (four) hours as needed.     amLODipine 10 MG tablet  Commonly known as:  NORVASC  Take 10 mg by mouth every morning.     aspirin EC 81 MG tablet  Take 81 mg by mouth every morning.     atenolol 25 MG tablet  Commonly known as:   TENORMIN  Take  25 mg by mouth 2 (two) times daily.     buPROPion 150 MG 12 hr tablet  Commonly known as:  WELLBUTRIN SR  Take 1 tablet (150 mg total) by mouth 2 (two) times daily.     diclofenac 75 MG EC tablet  Commonly known as:  VOLTAREN  Take 75 mg by mouth 2 (two) times daily.     dicyclomine 20 MG tablet  Commonly known as:  BENTYL  Take 20 mg by mouth every 4 (four) hours as needed for spasms.     EDARBI 80 MG Tabs  Generic drug:  Azilsartan Medoxomil  Take 80 mg by mouth daily.     folic acid 1 MG tablet  Commonly known as:  FOLVITE  Take 1 mg by mouth 2 (two) times daily.     lidocaine-prilocaine cream  Commonly known as:  EMLA  Apply 1 application topically as needed.     loperamide 2 MG capsule  Commonly known as:  IMODIUM  Take 2 mg by mouth as needed for diarrhea or loose stools.     MULTIVITAMIN ADULTS 50+ PO  Take 1 tablet by mouth every morning.     mupirocin cream 2 %  Commonly known as:  BACTROBAN  Apply 1 application topically 2 (two) times daily.     nicotine 14 mg/24hr patch  Commonly known as:  NICODERM CQ - dosed in mg/24 hours  Place 1 patch (14 mg total) onto the skin daily.     nicotine 7 mg/24hr patch  Commonly known as:  NICODERM CQ - dosed in mg/24 hr  Place 1 patch (7 mg total) onto the skin daily.     omeprazole 40 MG capsule  Commonly known as:  PRILOSEC  Take 40 mg by mouth every morning.     PROBIOTIC DAILY PO  Take 1 tablet by mouth every morning.     prochlorperazine 10 MG tablet  Commonly known as:  COMPAZINE  Take 1 tablet (10 mg total) by mouth every 6 (six) hours as needed for nausea or vomiting.     sodium chloride 1 G tablet  Take 1 g by mouth 3 (three) times daily.     SPIRIVA RESPIMAT 1.25 MCG/ACT Aers  Generic drug:  Tiotropium Bromide Monohydrate  Inhale 2 puffs into the lungs daily.     white petrolatum Gel  Commonly known as:  VASELINE  Apply 1 application topically daily as needed for lip care or dry  skin.        Relevant Imaging Results:  Relevant Lab Results:  Recent Labs    Additional Information    Carleena Mires, Jones Broom, LCSWA

## 2015-09-11 NOTE — Clinical Social Work Note (Signed)
Clinical Social Work Assessment  Patient Details  Name: Spencer Long MRN: [id number] Date of Birth: 1950-11-25  Date of referral:  09/11/15               Reason for consult:  Facility Placement                Permission sought to share information with:  Family Supports, Customer service manager Permission granted to share information::  Yes, Verbal Permission Granted  Name::     Spencer Long patient's wife  Agency::  SNF admissions  Relationship::     Contact Information:     Housing/Transportation Living arrangements for the past 2 months:  Single Family Home Source of Information:  Patient, Spouse Patient Interpreter Needed:  None Criminal Activity/Legal Involvement Pertinent to Current Situation/Hospitalization:  No - Comment as needed Significant Relationships:  Spouse Lives with:  Spouse Do you feel safe going back to the place where you live?  Yes (Patient feels safe to return back home wants he has received some short term rehab.) Need for family participation in patient care:  Yes (Comment)  Care giving concerns:  Patient and his wife feel he needs some short term rehab in order to return back home.   Social Worker assessment / plan:  Patient is a 64 year old male who lives with his wife.  Patient is alert and oriented x4 but not very talkative.  Patient was not able to talk very much assessment completed by talking with patient's wife.  Patient's wife expresses that patient has never been to rehab before, CSW explained what to expect and how insurance pays for SNF placement.  Patient's wife asked several questions about what to expect once he is at facility and also how to get there.  Patient's wife expressed questions were answered and she felt better about what to expect.  Patient's wife would like [facility], [facility], or [facility] SNF if possible.  Employment status:  Retired Nurse, adult PT Recommendations:  [place] / Referral to community resources:     Patient/Family's Response to care:  Patient and family in agreement to going to SNF for short term rehab.  Patient/Family's Understanding of and Emotional Response to Diagnosis, Current Treatment, and Prognosis:  Patient and wife seem to be aware of the current diagnosis and current treatment plan.  Patient and wife are aware of patient' prognosis.  Emotional Assessment Appearance:  Appears stated age Attitude/Demeanor/Rapport:    Affect (typically observed):  Appropriate, Quiet Orientation:  Oriented to Self, Oriented to Place, Oriented to  Time, Oriented to Situation Alcohol / Substance use:  Not Applicable Psych involvement (Current and /or in the community):     Discharge Needs  Concerns to be addressed:  Adjustment to Illness Readmission within the last 30 days:  No Current discharge risk:  None Barriers to Discharge:  No Barriers Identified   Ross Ludwig, LCSWA 09/11/2015, 2:20 PM

## 2015-09-12 LAB — BASIC METABOLIC PANEL
ANION GAP: 7 (ref 5–15)
BUN: 19 mg/dL (ref 6–20)
CALCIUM: 8.2 mg/dL — AB (ref 8.9–10.3)
CO2: 24 mmol/L (ref 22–32)
Chloride: 93 mmol/L — ABNORMAL LOW (ref 101–111)
Creatinine, Ser: 0.53 mg/dL — ABNORMAL LOW (ref 0.61–1.24)
Glucose, Bld: 119 mg/dL — ABNORMAL HIGH (ref 65–99)
Potassium: 3.9 mmol/L (ref 3.5–5.1)
SODIUM: 124 mmol/L — AB (ref 135–145)

## 2015-09-12 NOTE — Progress Notes (Signed)
Progress Note   Spencer Long NLG:[id number] DOB: 18-Dec-1950 DOA: 09/06/2015 PCP: [name], MD   Brief Narrative:   Spencer Long is an 64 y.o. male with a PMH of small cell lung cancer with brain metastasis, status post stereotactic radiation therapy under the care of Dr. [name] and chronic hyponatremia on salt tablets, compliant with same, was brought to the hospital 09/06/15 via EMS secondary to altered mental status. Upon initial evaluation in the ED, the patient was noted to have a sodium of 119. CT of the head was stable compared to prior. His mental status is much better, he knows he is in the hospital and answering questions appropriately.   Assessment/Plan:   Principal Problem:  Acute encephalopathy secondary to hyponatremia versus cerebral edema from brain metastasis - Sodium 119---> 126  Back to 124 today, continue salt tablets and demeclocycline but stop the IV fluids.  Continue. Not much improvement from yesterday in the sodium level but his encephalopathy is improving.  - CT of the head stable. Likely has underlying vasogenic edema. Continue Decadron. - Dr. [name] and Dr. [name] has been notified of the patient's admission. - Mental status improving. Advance diet. - palliative care consulted as per family's request.    Active Problems:  Small cell lung cancer ([place]) - Discussed case with Dr. [name] - plan for chemo outpatient when he re covers from this acute confusion.    Sarcoidosis (HCC)/COPD (chronic obstructive pulmonary disease) (HCC) - Continue Spiriva.    Hypertension - Continue Norvasc, atenolol, Edarbi (substitute Avapro).   Depression - Continue Wellbutrin.    DVT prophylaxis - Lovenox ordered.  Family Communication/Anticipated D/C date and plan/Code Status   Family Communication: Wife updated at the bedside. Disposition Plan: SNF, when his confusion improves.  Anticipated D/C date:  Possibly Monday.  Code Status:     Code Status Orders        Start     Ordered   09/06/15 1456  Do not attempt resuscitation (DNR)   Continuous    Question Answer Comment  In the event of cardiac or respiratory ARREST Do not call a "code blue"   In the event of cardiac or respiratory ARREST Do not perform Intubation, CPR, defibrillation or ACLS   In the event of cardiac or respiratory ARREST Use medication by any route, position, wound care, and other measures to relive pain and suffering. May use oxygen, suction and manual treatment of airway obstruction as needed for comfort.      09/06/15 1455    Advance Directive Documentation        Most Recent Value   Type of Advance Directive  Healthcare Power of Attorney, Living will   Pre-existing out of facility DNR order (yellow form or pink MOST form)     "MOST" Form in Place?         IV Access:    Peripheral IV   Procedures and diagnostic studies:   Dg Chest 1 View  09/06/2015  CLINICAL DATA:  64 year old male with new altered mental status. Lung cancer with recently diagnosed metastatic disease to the brain. Subsequent encounter. EXAM: CHEST 1 VIEW COMPARISON:  PET-CT 08/16/2015, and earlier. FINDINGS: Right chest porta cath now in place. Stable cardiac size and mediastinal contours. Stable lung volumes. No pneumothorax, pulmonary edema, pleural effusion. Continued streaky retrocardiac opacity. Increased interstitial markings in both lungs may reflect vascular congestion, no overt edema. IMPRESSION: Stable from recent PET-CT.  No new cardiopulmonary abnormality. Electronically Signed  By: [name] M.D.   On: 09/06/2015 08:30   Ct Head Wo Contrast  09/06/2015  CLINICAL DATA:  Altered mental status, history of small cell lung carcinoma with intracranial metastatic disease EXAM: CT HEAD WITHOUT CONTRAST TECHNIQUE: Contiguous axial images were obtained from the base of the skull through the vertex without intravenous contrast. COMPARISON:  08/25/2015 FINDINGS: Bony  calvarium is intact. No gross soft tissue abnormality is noted. Areas a scatter white matter ischemic change are seen. The known metastatic lesions are not well appreciated on this exam. No significant white matter edema is noted. Motion artifact somewhat degrades the study. No acute hemorrhage is seen. IMPRESSION: No acute abnormality noted.  Mild chronic ischemic changes are seen. Electronically Signed   By: [name] M.D.   On: 09/06/2015 08:56     Medical Consultants:    Dr. Curt Bears, Oncology  Anti-Infectives:   Anti-infectives    Start     Dose/Rate Route Frequency Ordered Stop   09/06/15 1300  demeclocycline (DECLOMYCIN) tablet 150 mg     150 mg Oral 4 times per day 09/06/15 1219        Subjective:   Spencer Long denies anynew complaints.   Objective:    Filed Vitals:   09/11/15 2239 09/11/15 2300 09/12/15 0447 09/12/15 1335  BP: 162/84  138/76 116/60  Pulse: 80 86 78 73  Temp: 98 F (36.7 C)  97.4 F (36.3 C) 97.7 F (36.5 C)  TempSrc: Oral  Oral Oral  Resp: '18  18 17  '$ Height:      Weight:      SpO2: 97%  97% 97%    Intake/Output Summary (Last 24 hours) at 09/12/15 1737 Last data filed at 09/12/15 0800  Gross per 24 hour  Intake   1480 ml  Output   2200 ml  Net   -720 ml   Filed Weights   09/06/15 1109  Weight: 72 kg (158 lb 11.7 oz)    Exam: Gen:  Restless but more alert Cardiovascular:  Tachycardia, No M/R/G Respiratory:  Lungs CTAB Gastrointestinal:  Abdomen soft, NT/ND, + BS Extremities:  No C/E/C   Data Reviewed:    Labs: Basic Metabolic Panel:  Recent Labs Lab 09/08/15 0513 09/09/15 0545 09/10/15 1055 09/11/15 0930 09/12/15 0500  NA 125* 125* 125* 126* 124*  K 3.6 4.4 3.5 3.4* 3.9  CL 94* 93* 92* 91* 93*  CO2 '23 24 23 25 24  '$ GLUCOSE 142* 134* 129* 117* 119*  BUN '11 13 18 16 19  '$ CREATININE 0.53* 0.57* 0.58* 0.52* 0.53*  CALCIUM 8.6* 8.4* 8.5* 8.6* 8.2*   GFR Estimated Creatinine Clearance: 92.1 mL/min (by  C-G formula based on Cr of 0.53). Liver Function Tests:  Recent Labs Lab 09/06/15 0817  AST 37  ALT 26  ALKPHOS 87  BILITOT 1.1  PROT 6.7  ALBUMIN 4.3   CBC:  Recent Labs Lab 09/06/15 0817  WBC 6.5  NEUTROABS 5.3  HGB 12.6*  HCT 33.9*  MCV 83.3  PLT 275   CBG:  Recent Labs Lab 09/06/15 0753  GLUCAP 111*   Sepsis Labs:  Recent Labs Lab 09/06/15 0817 09/06/15 0828  WBC 6.5  --   LATICACIDVEN  --  1.34   Microbiology Recent Results (from the past 240 hour(s))  Urine culture     Status: None   Collection Time: 09/06/15  8:17 AM  Result Value Ref Range Status   Specimen Description URINE, CLEAN CATCH  Final  Special Requests NONE  Final   Culture   Final    NO GROWTH 1 DAY Performed at [facility]    Report Status 09/07/2015 FINAL  Final     Medications:   . amLODipine  10 mg Oral Daily  . aspirin EC  81 mg Oral Daily  . atenolol  25 mg Oral BID  . buPROPion  150 mg Oral BID  . demeclocycline  150 mg Oral 4 times per day  . dexamethasone  4 mg Intravenous 4 times per day  . diclofenac  75 mg Oral BID  . enoxaparin (LOVENOX) injection  40 mg Subcutaneous Q24H  . feeding supplement (ENSURE ENLIVE)  237 mL Oral BID BM  . folic acid  1 mg Oral BID  . irbesartan  300 mg Oral Daily  . multivitamin with minerals  1 tablet Oral Daily  . nicotine  14 mg Transdermal Daily  . pantoprazole  40 mg Oral Daily  . sodium chloride  3 mL Intravenous Q12H  . sodium chloride  1 g Oral TID  . thiamine  100 mg Oral Daily   Continuous Infusions:    Time spent: 25  minutes.     LOS: 6 days   [facility]  [facility] Pager [phone]   *Please refer to [email or web address], password [email or web address] to get updated schedule on who will round on this patient, as hospitalists switch teams weekly. If 7PM-7AM, please contact night-coverage at [email or web address], password [email or web address] for any overnight needs.  09/12/2015, 5:37 PM

## 2015-09-13 ENCOUNTER — Telehealth: Payer: Self-pay | Admitting: Medical Oncology

## 2015-09-13 ENCOUNTER — Ambulatory Visit: Payer: Commercial Managed Care - HMO | Admitting: Radiation Oncology

## 2015-09-13 LAB — URINALYSIS, ROUTINE W REFLEX MICROSCOPIC
Bilirubin Urine: NEGATIVE
GLUCOSE, UA: NEGATIVE mg/dL
Hgb urine dipstick: NEGATIVE
Ketones, ur: NEGATIVE mg/dL
LEUKOCYTES UA: NEGATIVE
NITRITE: NEGATIVE
PH: 6 (ref 5.0–8.0)
Protein, ur: NEGATIVE mg/dL
SPECIFIC GRAVITY, URINE: 1.004 — AB (ref 1.005–1.030)
Urobilinogen, UA: 0.2 mg/dL (ref 0.0–1.0)

## 2015-09-13 LAB — BASIC METABOLIC PANEL
ANION GAP: 9 (ref 5–15)
BUN: 20 mg/dL (ref 6–20)
CO2: 23 mmol/L (ref 22–32)
Calcium: 8.2 mg/dL — ABNORMAL LOW (ref 8.9–10.3)
Chloride: 87 mmol/L — ABNORMAL LOW (ref 101–111)
Creatinine, Ser: 0.52 mg/dL — ABNORMAL LOW (ref 0.61–1.24)
GFR calc Af Amer: >60 mL/min (ref >60)
GFR calc non Af Amer: >60 mL/min (ref >60)
GLUCOSE: 119 mg/dL — AB (ref 65–99)
POTASSIUM: 3.7 mmol/L (ref 3.5–5.1)
Sodium: 119 mmol/L — CL (ref 135–145)

## 2015-09-13 LAB — SODIUM: SODIUM: 121 mmol/L — AB (ref 135–145)

## 2015-09-13 MED ORDER — TOLVAPTAN 15 MG PO TABS
15.0000 mg | ORAL_TABLET | ORAL | Status: DC
  Administered 2015-09-13: 15 mg via ORAL
  Filled 2015-09-13 (×2): qty 1

## 2015-09-13 MED ORDER — SODIUM CHLORIDE 0.9 % IV SOLN
INTRAVENOUS | Status: DC
  Administered 2015-09-13: 11:00:00 via INTRAVENOUS

## 2015-09-13 NOTE — Progress Notes (Signed)
Progress Note   Spencer Long:[id number] DOB: 12/30/1950 DOA: 09/06/2015 PCP: Spencer Austria, MD   Brief Narrative:   Spencer Long is an 64 y.o. male with a PMH of small cell lung cancer with brain metastasis, status post stereotactic radiation therapy under the care of Dr. Lisbeth Long and chronic hyponatremia on salt tablets, compliant with same, was brought to the hospital 09/06/15 via EMS secondary to altered mental status. Upon initial evaluation in the ED, the patient was noted to have a sodium of 119. CT of the head was stable compared to prior. His mental status is much better, he knows he is in the hospital and answering questions appropriately.   Assessment/Plan:   Principal Problem:  Acute encephalopathy secondary to hyponatremia versus cerebral edema from brain metastasis - Sodium 119---> 121 continue salt tablets and demeclocycline but stop the IV fluids.  Continue. Not much improvement from yesterday in the sodium level but his encephalopathy is improving. Renal consulted and recommendations given.  - CT of the head stable. Likely has underlying vasogenic edema. Continue Decadron. - Dr. Lisbeth Long and Dr. Julien Long has been notified of the patient's admission. - Mental status improving. Advance diet. - palliative care consulted as per family's request.    Active Problems:  Small cell lung cancer ([place]) - Discussed case with Dr. Julien Long - plan for chemo outpatient when he re covers from this acute confusion.  - radiation treatment planned for Wednesday, .    Sarcoidosis (HCC)/COPD (chronic obstructive pulmonary disease) (HCC) - Continue Spiriva.    Hypertension - Continue Norvasc, atenolol, Edarbi (substitute Avapro).   Depression - Continue Wellbutrin.    DVT prophylaxis - Lovenox ordered.  Family Communication/Anticipated D/C date and plan/Code Status   Family Communication: Wife updated at the bedside. Disposition Plan: SNF, when his confusion  improves.  Anticipated d/c date. : possibly tomorrow when bed avaiable and dsodium improves. .  Code Status:     Code Status Orders        Start     Ordered   09/06/15 1456  Do not attempt resuscitation (DNR)   Continuous    Question Answer Comment  In the event of cardiac or respiratory ARREST Do not call a "code blue"   In the event of cardiac or respiratory ARREST Do not perform Intubation, CPR, defibrillation or ACLS   In the event of cardiac or respiratory ARREST Use medication by any route, position, wound care, and other measures to relive pain and suffering. May use oxygen, suction and manual treatment of airway obstruction as needed for comfort.      09/06/15 1455    Advance Directive Documentation        Most Recent Value   Type of Advance Directive  Healthcare Power of Attorney, Living will   Pre-existing out of facility DNR order (yellow form or pink MOST form)     "MOST" Form in Place?         IV Access:    Peripheral IV   Procedures and diagnostic studies:   Dg Chest 1 View  09/06/2015  CLINICAL DATA:  64 year old male with new altered mental status. Lung cancer with recently diagnosed metastatic disease to the brain. Subsequent encounter. EXAM: CHEST 1 VIEW COMPARISON:  PET-CT 08/16/2015, and earlier. FINDINGS: Right chest porta cath now in place. Stable cardiac size and mediastinal contours. Stable lung volumes. No pneumothorax, pulmonary edema, pleural effusion. Continued streaky retrocardiac opacity. Increased interstitial markings in both lungs may reflect vascular  congestion, no overt edema. IMPRESSION: Stable from recent PET-CT.  No new cardiopulmonary abnormality. Electronically Signed   By: Spencer Long M.D.   On: 09/06/2015 08:30   Ct Head Wo Contrast  09/06/2015  CLINICAL DATA:  Altered mental status, history of small cell lung carcinoma with intracranial metastatic disease EXAM: CT HEAD WITHOUT CONTRAST TECHNIQUE: Contiguous axial images were obtained from  the base of the skull through the vertex without intravenous contrast. COMPARISON:  08/25/2015 FINDINGS: Bony calvarium is intact. No gross soft tissue abnormality is noted. Areas a scatter white matter ischemic change are seen. The known metastatic lesions are not well appreciated on this exam. No significant white matter edema is noted. Motion artifact somewhat degrades the study. No acute hemorrhage is seen. IMPRESSION: No acute abnormality noted.  Mild chronic ischemic changes are seen. Electronically Signed   By: Spencer Long M.D.   On: 09/06/2015 08:56     Medical Consultants:    Dr. Curt Bears, Oncology  Anti-Infectives:   Anti-infectives    Start     Dose/Rate Route Frequency Ordered Stop   09/06/15 1300  demeclocycline (DECLOMYCIN) tablet 150 mg     150 mg Oral 4 times per day 09/06/15 1219        Subjective:   Spencer Long denies anynew complaints.   Objective:    Filed Vitals:   09/12/15 1335 09/12/15 2247 09/13/15 0424 09/13/15 1443  BP: 116/60 145/71 143/72 118/57  Pulse: 73 71 68 81  Temp: 97.7 F (36.5 C) 97.4 F (36.3 C) 97.9 F (36.6 C) 97.7 F (36.5 C)  TempSrc: Oral Oral Oral Oral  Resp: '17 18 18 20  '$ Height:      Weight:      SpO2: 97% 95% 96% 96%    Intake/Output Summary (Last 24 hours) at 09/13/15 1835 Last data filed at 09/13/15 1700  Gross per 24 hour  Intake   2085 ml  Output   1700 ml  Net    385 ml   Filed Weights   09/06/15 1109  Weight: 72 kg (158 lb 11.7 oz)    Exam: Gen:  Restless but more alert Cardiovascular:  Tachycardia, No M/R/G Respiratory:  Lungs CTAB Gastrointestinal:  Abdomen soft, NT/ND, + BS Extremities:  No C/E/C   Data Reviewed:    Labs: Basic Metabolic Panel:  Recent Labs Lab 09/09/15 0545 09/10/15 1055 09/11/15 0930 09/12/15 0500 09/13/15 0440 09/13/15 1455  NA 125* 125* 126* 124* 119* 121*  K 4.4 3.5 3.4* 3.9 3.7  --   CL 93* 92* 91* 93* 87*  --   CO2 '24 23 25 24 23  '$ --   GLUCOSE 134*  129* 117* 119* 119*  --   BUN '13 18 16 19 20  '$ --   CREATININE 0.57* 0.58* 0.52* 0.53* 0.52*  --   CALCIUM 8.4* 8.5* 8.6* 8.2* 8.2*  --    GFR Estimated Creatinine Clearance: 92.1 mL/min (by C-G formula based on Cr of 0.52). Liver Function Tests: No results for input(s): AST, ALT, ALKPHOS, BILITOT, PROT, ALBUMIN in the last 168 hours. CBC: No results for input(s): WBC, NEUTROABS, HGB, HCT, MCV, PLT in the last 168 hours. CBG: No results for input(s): GLUCAP in the last 168 hours. Sepsis Labs: No results for input(s): PROCALCITON, WBC, LATICACIDVEN in the last 168 hours. Microbiology Recent Results (from the past 240 hour(s))  Urine culture     Status: None   Collection Time: 09/06/15  8:17 AM  Result Value  Ref Range Status   Specimen Description URINE, CLEAN CATCH  Final   Special Requests NONE  Final   Culture   Final    NO GROWTH 1 DAY Performed at [facility]    Report Status 09/07/2015 FINAL  Final     Medications:   . amLODipine  10 mg Oral Daily  . aspirin EC  81 mg Oral Daily  . atenolol  25 mg Oral BID  . buPROPion  150 mg Oral BID  . demeclocycline  150 mg Oral 4 times per day  . dexamethasone  4 mg Intravenous 4 times per day  . diclofenac  75 mg Oral BID  . enoxaparin (LOVENOX) injection  40 mg Subcutaneous Q24H  . feeding supplement (ENSURE ENLIVE)  237 mL Oral BID BM  . folic acid  1 mg Oral BID  . multivitamin with minerals  1 tablet Oral Daily  . nicotine  14 mg Transdermal Daily  . pantoprazole  40 mg Oral Daily  . sodium chloride  3 mL Intravenous Q12H  . sodium chloride  1 g Oral TID  . thiamine  100 mg Oral Daily  . tolvaptan  15 mg Oral Q24H   Continuous Infusions:    Time spent: 25  minutes.     LOS: 7 days   [facility]  [facility] Pager [phone]   *Please refer to [email or web address], password [email or web address] to get updated schedule on who will round on this patient, as hospitalists switch teams weekly. If 7PM-7AM, please contact  night-coverage at [email or web address], password [email or web address] for any overnight needs.  09/13/2015, 6:35 PM

## 2015-09-13 NOTE — Care Management Important Message (Signed)
Important Message  Patient Details  Name: Spencer Long MRN: [id number] Date of Birth: 09/06/1951   Medicare Important Message Given:  Yes    Camillo Flaming 09/13/2015, 11:51 AMImportant Message  Patient Details  Name: Spencer Long MRN: [id number] Date of Birth: 03/10/1951   Medicare Important Message Given:  Yes    Camillo Flaming 09/13/2015, 11:51 AM

## 2015-09-13 NOTE — Consult Note (Signed)
Renal Service Consult Note [facility]  Spencer Long 09/13/2015 [name] D Requesting Physician:  Dr [name]  Reason for Consult:  Hyponatremia HPI: The patient is a 64 y.o. year-old with hx of sarcoidosis, tobacco abuse, HTN who was diagnosed with small cell lung cancer in 2015 treated with chemoRx then prophylactic cranial irradiation, then had recurrence of brain mets in Oct 2016 by MRI.  Serum Na was 119 on admission, improved to 126 then now is down back to 119 today.  Was on salt tabs on admission. Fluid restriction and demeclocycline started here. He is getting an ARB here and at home.    No urine osm on the chart.     ROS  no ha, cp/ sob/ n/v/d   normal bm's  no voiding troubles  no abd pain  Past Medical History  Past Medical History  Diagnosis Date  . Hypertension     under control, has been on med. x 15-20 yrs.  [name] GERD (gastroesophageal reflux disease)   . Infection of elbow 2011    right-no infection at present , but has skin lesions present  . Sarcoidosis ([place])     "tends to have skin flareups" and presently experiencing now"feet, legs and arms"  . Chronic hyponatremia     Dr. Buddy Duty follows  . Radiation 02/11/15-02/25/15    brain 25 Gy  . DNR no code (do not resuscitate) 08/02/2015  . Lung cancer [facility])    Past Surgical History  Past Surgical History  Procedure Laterality Date  . Cholecystectomy  05/2006  . Ulnar nerve repair  01/2008    right; decompression  . Carpal tunnel release  01/2008    right  . Eye surgery  12/2008    correction ectropion, release band, wedge exc., shortening  . I&d extremity  09/12/2011    Procedure: IRRIGATION AND DEBRIDEMENT EXTREMITY;  Surgeon: Tennis Must;  Location: [place];  Service: Orthopedics;  Laterality: Right;  I&d RIGHT OLECRANON BURSSA   . Tonsillectomy    . Endobronchial ultrasound Bilateral 08/24/2014    Procedure: ENDOBRONCHIAL ULTRASOUND;  Surgeon: Collene Gobble, MD;   Location: [facility];  Service: Cardiopulmonary;  Laterality: Bilateral;   Family History  Family History  Problem Relation Age of Onset  . Cancer Father     Leukemia  . Cancer Mother     breast  . Dementia Mother    Social History  reports that he has quit smoking. His smoking use included Cigarettes. He has a 60 pack-year smoking history. He quit smokeless tobacco use about 12 months ago. He reports that he drinks about 8.4 oz of alcohol per week. He reports that he does not use illicit drugs. Allergies  Allergies  Allergen Reactions  . Varenicline     Chantix - caused strange behavior  . Isothiazolinone Chloride Rash  . Oxycodone Rash  . Quaternium-15 Rash   Home medications Prior to Admission medications   Medication Sig Start Date End Date Taking? Authorizing Provider  amLODipine (NORVASC) 10 MG tablet Take 10 mg by mouth every morning.  07/23/14  Yes Historical Provider, MD  aspirin EC 81 MG tablet Take 81 mg by mouth every morning.    Yes Historical Provider, MD  atenolol (TENORMIN) 25 MG tablet Take 25 mg by mouth 2 (two) times daily.   Yes Historical Provider, MD  Azilsartan Medoxomil (EDARBI) 80 MG TABS Take 80 mg by mouth daily.    Yes Historical Provider, MD  buPROPion [facility]  SR) 150 MG 12 hr tablet Take 1 tablet (150 mg total) by mouth 2 (two) times daily. 08/24/15  Yes Collene Gobble, MD  diclofenac (VOLTAREN) 75 MG EC tablet Take 75 mg by mouth 2 (two) times daily.   Yes Historical Provider, MD  dicyclomine (BENTYL) 20 MG tablet Take 20 mg by mouth every 4 (four) hours as needed for spasms.  08/02/14  Yes Historical Provider, MD  folic acid (FOLVITE) 1 MG tablet Take 1 mg by mouth 2 (two) times daily. 07/06/14  Yes Historical Provider, MD  lidocaine-prilocaine (EMLA) cream Apply 1 application topically as needed. 08/17/15  Yes Curt Bears, MD  loperamide (IMODIUM) 2 MG capsule Take 2 mg by mouth as needed for diarrhea or loose stools.   Yes Historical Provider,  MD  Multiple Vitamins-Minerals (MULTIVITAMIN ADULTS 50+ PO) Take 1 tablet by mouth every morning.    Yes Historical Provider, MD  mupirocin cream (BACTROBAN) 2 % Apply 1 application topically 2 (two) times daily.   Yes Historical Provider, MD  nicotine (NICODERM CQ - DOSED IN MG/24 HOURS) 14 mg/24hr patch Place 1 patch (14 mg total) onto the skin daily. 11/27/14  Yes Collene Gobble, MD  omeprazole (PRILOSEC) 40 MG capsule Take 40 mg by mouth every morning.   Yes Historical Provider, MD  Probiotic Product (PROBIOTIC DAILY PO) Take 1 tablet by mouth every morning.    Yes Historical Provider, MD  sodium chloride 1 G tablet Take 1 g by mouth 3 (three) times daily.   Yes Historical Provider, MD  Tiotropium Bromide Monohydrate (SPIRIVA RESPIMAT) 1.25 MCG/ACT AERS Inhale 2 puffs into the lungs daily.   Yes Historical Provider, MD  white petrolatum (VASELINE) GEL Apply 1 application topically daily as needed for lip care or dry skin.   Yes Historical Provider, MD  Albuterol Sulfate (PROAIR RESPICLICK) 035 (90 BASE) MCG/ACT AEPB Inhale 2 puffs into the lungs every 4 (four) hours as needed. Patient not taking: Reported on 09/06/2015 05/21/15   Collene Gobble, MD  nicotine (NICODERM CQ - DOSED IN MG/24 HR) 7 mg/24hr patch Place 1 patch (7 mg total) onto the skin daily. Patient not taking: Reported on 09/06/2015 07/22/15   Collene Gobble, MD  prochlorperazine (COMPAZINE) 10 MG tablet Take 1 tablet (10 mg total) by mouth every 6 (six) hours as needed for nausea or vomiting. Patient not taking: Reported on 09/06/2015 08/17/15   Curt Bears, MD   Liver Function Tests No results for input(s): AST, ALT, ALKPHOS, BILITOT, PROT, ALBUMIN in the last 168 hours. No results for input(s): LIPASE, AMYLASE in the last 168 hours. CBC No results for input(s): WBC, NEUTROABS, HGB, HCT, MCV, PLT in the last 168 hours. Basic Metabolic Panel  Recent Labs Lab 09/07/15 1010 09/08/15 0513 09/09/15 0545 09/10/15 1055  09/11/15 0930 09/12/15 0500 09/13/15 0440  NA 123* 125* 125* 125* 126* 124* 119*  K 3.9 3.6 4.4 3.5 3.4* 3.9 3.7  CL 92* 94* 93* 92* 91* 93* 87*  CO2 21* '23 24 23 25 24 23  '$ GLUCOSE 127* 142* 134* 129* 117* 119* 119*  BUN '12 11 13 18 16 19 20  '$ CREATININE 0.57* 0.53* 0.57* 0.58* 0.52* 0.53* 0.52*  CALCIUM 8.9 8.6* 8.4* 8.5* 8.6* 8.2* 8.2*    Filed Vitals:   09/12/15 0447 09/12/15 1335 09/12/15 2247 09/13/15 0424  BP: 138/76 116/60 145/71 143/72  Pulse: 78 73 71 68  Temp: 97.4 F (36.3 C) 97.7 F (36.5 C) 97.4 F (36.3 C)  97.9 F (36.6 C)  TempSrc: Oral Oral Oral Oral  Resp: '18 17 18 18  '$ Height:      Weight:      SpO2: 97% 97% 95% 96%   Exam Alert, no distress, ox 3 No rash, cyanosis or gangrene Sclera anicteric, throat clear No jvd Chest clear bilat RRR no MRG ABd soft ntnd no ascites or hsm No LE or UE edema Neuro is alert, Ox 3, nf   Assessment: 1. 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 Check urine osm, stop 0.9% NS and stop ARB.  ACEi/ ARB's don't cause low Na+ but can impair recovery.  Will give tolvaptan 15 mg day, when Na > 125 will increase NaCl tabs to 2 gm tid (max 3 gm tid). He is not on a fluid restriction, but should be - this will be restarted after tolvaptan has been dc'd.   2. Small cell lung Ca w mets 3. HTN 4. DNR   Plan- as above  Kelly Splinter MD [facility] pager [phone]    cell [phone] 09/13/2015, 1:56 PM

## 2015-09-13 NOTE — Progress Notes (Signed)
Date: September 13, 2015 Chart reviewed for concurrent status and case management needs. Will continue to follow patient for changes and needs: Velva Harman, RN, BSN, [place]   [phone]

## 2015-09-13 NOTE — Progress Notes (Signed)
Sodium this AM is 119 (was 124 yesterday).  No changes or increase in confusion in pt.  [place] on call and notified and am awaiting reply.  IVF were stopped yesterday

## 2015-09-13 NOTE — Progress Notes (Signed)
Physical Therapy Treatment Patient Details Name: Spencer Long MRN: [id number] DOB: 15-Jun-1951 Today's Date: 09/13/2015    History of Present Illness Spencer Long is an 64 y.o. male with a PMH of small cell lung cancer with brain metastasis, status post stereotactic radiation therapy under the care of Dr. [name] and chronic hyponatremia on salt tablets, compliant with same, was brought to the hospital via EMS secondary to altered mental status. History in ER was obtained from wife due to pt confusion.     PT Comments    Assistsed pt OOb to amb to bathroom.  Very unsteady.  Required 75% VC's for direction and safety.  Impulsive.  Anxious.  Assisted in bathroom due to impaired balance.  Amb a limited distance in hallway.  Noted 3/4 DOE.  RA 96%. Left in recliner with chair alarm.    Follow Up Recommendations  SNF     Equipment Recommendations       Recommendations for Other Services       Precautions / Restrictions Precautions Precautions: Fall Precaution Comments: impaired safety cognition/impulsive Restrictions Weight Bearing Restrictions: No    Mobility  Bed Mobility Overal bed mobility: Needs Assistance             General bed mobility comments: increased time esp with scooting self to EOB.  Repeat cueing to complete task.   Transfers Overall transfer level: Needs assistance Equipment used: Rolling walker (2 wheeled) Transfers: Sit to/from Stand Sit to Stand: Min assist         General transfer comment: 75% VC's on safety with turns and proper walker to self distance.  Impulsive/quick to sit.  75% VC's on safety with proper hand placemnent, esp stand to sit.   Ambulation/Gait   Ambulation Distance (Feet): 18 Feet (5', 13' one sitting rest break) Assistive device: Rolling walker (2 wheeled)   Gait velocity: decreased   General Gait Details: 75% VC's on proper walker to self distance, up right posture and recliner following as pt sits quickly when  fatigued.  Noted 3/4 DOE.  Unsteady gait.  HIGH FALL RISK   Stairs            Wheelchair Mobility    Modified Rankin (Stroke Patients Only)       Balance                                    Cognition Arousal/Alertness: Awake/alert Behavior During Therapy: Impulsive;Anxious Overall Cognitive Status: Impaired/Different from baseline Area of Impairment: Safety/judgement;Problem solving               General Comments: required repeat cueing    Exercises      General Comments        Pertinent Vitals/Pain Pain Assessment: No/denies pain    Home Living                      Prior Function            PT Goals (current goals can now be found in the care plan section) Progress towards PT goals: Progressing toward goals    Frequency  Min 3X/week    PT Plan Current plan remains appropriate    Co-evaluation             End of Session Equipment Utilized During Treatment: Gait belt Activity Tolerance: Treatment limited secondary to medical complications (Comment) Patient left: in chair;with call  bell/phone within reach;with chair alarm set;with family/visitor present     Time: 1140-1205 PT Time Calculation (min) (ACUTE ONLY): 25 min  Charges:  $Gait Training: 8-22 mins $Therapeutic Activity: 8-22 mins                    G Codes:      Rica Koyanagi  PTA [facility]  Acute  Rehab Pager      [phone]

## 2015-09-13 NOTE — Progress Notes (Signed)
Nutrition Follow-up  DOCUMENTATION CODES:   Not applicable  INTERVENTION:  - Continue Ensure Enlive po BID, each supplement provides 350 kcal and 20 grams of protein - Encourage PO intakes at meals - RD will continue to monitor for needs  NUTRITION DIAGNOSIS:   Increased nutrient needs related to catabolic illness, cancer and cancer related treatments as evidenced by estimated needs. -ongoing  GOAL:   Patient will meet greater than or equal to 90% of their needs -variably met  MONITOR:   PO intake, Supplement acceptance, Labs, I & O's  ASSESSMENT:   Spencer Long is an 64 y.o. male with a PMH of small cell lung cancer with brain metastasis, status post stereotactic radiation therapy under the care of Dr. [name] and chronic hyponatremia on salt tablets, compliant with same, was brought to the hospital via EMS secondary to altered mental status  11/14 Per chart review, pt ate 95% breakfast and 5% lunch 11/11 and 75% breakfast and 25% lunch and dinner yesterday (11/13). Pt reports good appetite considering that he has not been moving much since admission. He was drinking Ensure Enlive PTA and continues to drink it at least once/day when he takes AM medications.   Wife is at bedside and reports pt is possibly discharging today. She states that pt ate bacon, eggs, drank juice, and two cups of coffee this AM. She is very complimentary of the food and staff, but feels that pt receives meals too close together for him to be able to eat any of them adequately.  Variably meeting needs. Medications reviewed. Labs reviewed from this AM; Na: 119 mmol/L, Cl: 87 mmol/L, creatinine low, Ca: 8.2 mg/dL.    11/8 - Admitted with Sodium of 119 mmol/L.  -Spoke with patient's daughter at bedside. Pt was unable to answer questions.  - Daughter said pt had a good appetite at home, no chewing or swalling issues, no nausea/vomitting. - Nutrition-Focused physical exam completed. Findings are mild fat  depletion, no muscle depletion, and no edema.  - Pt exhibits 8#/4.4% insignificant wt loss in 10 days. Wt was stable prior to that. Likely cause of wt loss is increased nutrient needs related to cancer w/ mets.  Will provide Ensure Enlive during stay.   Diet Order:  Diet regular Room service appropriate?: Yes with Assist; Fluid consistency:: Thin  Skin:  Wound (see comment) (Laceration to L elbow)  Last BM:  11/12  Height:   Ht Readings from Last 1 Encounters:  09/06/15 _0  (1.753 m)    Weight:   Wt Readings from Last 1 Encounters:  09/06/15 158 lb 11.7 oz (72 kg)    Ideal Body Weight:  72.72 kg  BMI:  Body mass index is 23.43 kg/(m^2).  Estimated Nutritional Needs:   Kcal:  9622-2979  Protein:  85-110 grams  Fluid:  >/= 2.5L  EDUCATION NEEDS:   No education needs identified at this time     Jarome Matin, RD, LDN Inpatient Clinical Dietitian Pager # [phone] After hours/weekend pager # [phone]

## 2015-09-13 NOTE — Telephone Encounter (Signed)
I told suzanne per [name] he is not going to treat pt with chemo until he is stronger and done with rehab. I told her pt needs to keep his nov and dec appts as scheduled for lab and MD. Dr [name] will decide about further treatment after he sees him.

## 2015-09-14 ENCOUNTER — Other Ambulatory Visit: Payer: Commercial Managed Care - HMO

## 2015-09-14 ENCOUNTER — Telehealth: Payer: Self-pay | Admitting: Internal Medicine

## 2015-09-14 ENCOUNTER — Other Ambulatory Visit: Payer: Self-pay | Admitting: *Deleted

## 2015-09-14 DIAGNOSIS — C3490 Malignant neoplasm of unspecified part of unspecified bronchus or lung: Secondary | ICD-10-CM

## 2015-09-14 DIAGNOSIS — Z515 Encounter for palliative care: Secondary | ICD-10-CM | POA: Insufficient documentation

## 2015-09-14 LAB — BASIC METABOLIC PANEL
Anion gap: 6 (ref 5–15)
BUN: 26 mg/dL — ABNORMAL HIGH (ref 6–20)
CHLORIDE: 100 mmol/L — AB (ref 101–111)
CO2: 25 mmol/L (ref 22–32)
Calcium: 8.6 mg/dL — ABNORMAL LOW (ref 8.9–10.3)
Creatinine, Ser: 0.74 mg/dL (ref 0.61–1.24)
GFR calc non Af Amer: >60 mL/min (ref >60)
Glucose, Bld: 124 mg/dL — ABNORMAL HIGH (ref 65–99)
POTASSIUM: 4 mmol/L (ref 3.5–5.1)
SODIUM: 131 mmol/L — AB (ref 135–145)

## 2015-09-14 LAB — SODIUM
SODIUM: 130 mmol/L — AB (ref 135–145)
Sodium: 130 mmol/L — ABNORMAL LOW (ref 135–145)

## 2015-09-14 LAB — OSMOLALITY, URINE: Osmolality, Ur: 115 mOsm/kg — ABNORMAL LOW (ref 390–1090)

## 2015-09-14 LAB — SODIUM, URINE, RANDOM: Sodium, Ur: <10 mmol/L

## 2015-09-14 MED ORDER — SODIUM CHLORIDE 1 G PO TABS
2.0000 g | ORAL_TABLET | Freq: Three times a day (TID) | ORAL | Status: DC
  Administered 2015-09-14 – 2015-09-15 (×3): 2 g via ORAL
  Filled 2015-09-14 (×3): qty 2

## 2015-09-14 MED ORDER — LIDOCAINE-PRILOCAINE 2.5-2.5 % EX CREA: 1.0000 "application " | TOPICAL_CREAM | CUTANEOUS | Status: DC | PRN

## 2015-09-14 MED ORDER — FUROSEMIDE 20 MG PO TABS
20.0000 mg | ORAL_TABLET | Freq: Every day | ORAL | Status: DC
  Administered 2015-09-14 – 2015-09-15 (×2): 20 mg via ORAL
  Filled 2015-09-14 (×2): qty 1

## 2015-09-14 NOTE — Progress Notes (Signed)
Daily Progress Note   Patient Name: Spencer Long       Date: 09/14/2015 DOB: November 14, 1950  Age: 64 y.o. MRN#: [id number] Attending Physician: Hosie Poisson, MD Primary Care Physician: [name], MD Admit Date: 09/06/2015  Reason for Consultation/Follow-up: Establishing goals of care  Subjective: I met today with Mr. Spencer Long and Spencer Long at bedside. He appears very anxious and emotional - he is probably overwhelmed with the thoughts of going to rehab and is very emotional. He is fixated on how his PAC is going to be cared for and about "noise of electricity in the walls." Tried to reassure him that his concern is to eat and rehab to try and gain strength to get back home.   Also gave emotional support to Spencer Long who is nervous and wanting a more specific time frame for prognosis which is hard to tell at this time. Unfortunately time will only tell more about his prognosis. She understands it is poor. Plan for SNF rehab likely tomorrow.    Length of Stay: 8 days  Current Medications: Scheduled Meds:  . amLODipine  10 mg Oral Daily  . aspirin EC  81 mg Oral Daily  . atenolol  25 mg Oral BID  . buPROPion  150 mg Oral BID  . demeclocycline  150 mg Oral 4 times per day  . dexamethasone  4 mg Intravenous 4 times per day  . diclofenac  75 mg Oral BID  . enoxaparin (LOVENOX) injection  40 mg Subcutaneous Q24H  . feeding supplement (ENSURE ENLIVE)  237 mL Oral BID BM  . folic acid  1 mg Oral BID  . multivitamin with minerals  1 tablet Oral Daily  . nicotine  14 mg Transdermal Daily  . pantoprazole  40 mg Oral Daily  . sodium chloride  3 mL Intravenous Q12H  . sodium chloride  2 g Oral TID  . thiamine  100 mg Oral Daily    Continuous Infusions:    PRN  Meds: acetaminophen **OR** acetaminophen, dicyclomine, haloperidol lactate, ondansetron **OR** ondansetron (ZOFRAN) IV, polyethylene glycol, sodium chloride  Physical Exam: Physical Exam  Constitutional: He appears well-developed.  HENT:  Head: Normocephalic.  Cardiovascular: Normal rate and regular rhythm.   Pulmonary/Chest: Effort normal. No accessory muscle usage. No tachypnea. No respiratory distress.  Abdominal: Soft. Normal appearance.  He exhibits no distension.  Neurological: He is alert. He is disoriented.                Vital Signs: BP 124/55 mmHg  Pulse 70  Temp(Src) 98.1 F (36.7 C) (Oral)  Resp 20  Ht 5' 9"  (1.753 m)  Wt 72 kg (158 lb 11.7 oz)  BMI 23.43 kg/m2  SpO2 99% SpO2: SpO2: 99 % O2 Device: O2 Device: Not Delivered O2 Flow Rate:    Intake/output summary:  Intake/Output Summary (Last 24 hours) at 09/14/15 1431 Last data filed at 09/14/15 6712  Gross per 24 hour  Intake   1125 ml  Output   3275 ml  Net  -2150 ml   LBM: Last BM Date: 09/11/15 Baseline Weight: Weight: 72 kg (158 lb 11.7 oz) Most recent weight: Weight: 72 kg (158 lb 11.7 oz)       Palliative Assessment/Data: Flowsheet Rows        Most Recent Value   Intake Tab    Referral Department  Hospitalist   Unit at Time of Referral  Cardiac/Telemetry Unit   Palliative Care Primary Diagnosis  Cancer   Date Notified  09/10/15   Palliative Care Type  New Palliative care   Reason for referral  Clarify Goals of Care   Date of Admission  09/06/15   # of days IP prior to Palliative referral  4   Clinical Assessment    Psychosocial & Spiritual Assessment    Palliative Care Outcomes       Additional Data Reviewed: CBC    Component Value Date/Time   WBC 6.5 09/06/2015 0817   WBC 5.2 07/29/2015 0827   RBC 4.07* 09/06/2015 0817   RBC 4.00* 07/29/2015 0827   HGB 12.6* 09/06/2015 0817   HGB 12.7* 07/29/2015 0827   HCT 33.9* 09/06/2015 0817   HCT 35.8* 07/29/2015 0827   PLT 275 09/06/2015  0817   PLT 314 07/29/2015 0827   MCV 83.3 09/06/2015 0817   MCV 89.4 07/29/2015 0827   MCH 31.0 09/06/2015 0817   MCH 31.7 07/29/2015 0827   MCHC 37.2* 09/06/2015 0817   MCHC 35.4 07/29/2015 0827   RDW 12.1 09/06/2015 0817   RDW 12.2 07/29/2015 0827   LYMPHSABS 0.4* 09/06/2015 0817   LYMPHSABS 0.5* 07/29/2015 0827   MONOABS 0.8 09/06/2015 0817   MONOABS 0.8 07/29/2015 0827   EOSABS 0.0 09/06/2015 0817   EOSABS 0.2 07/29/2015 0827   BASOSABS 0.0 09/06/2015 0817   BASOSABS 0.0 07/29/2015 0827    CMP     Component Value Date/Time   NA 130* 09/14/2015 1225   NA 123* 07/29/2015 0827   K 4.0 09/14/2015 0547   K 5.6* 07/29/2015 0827   CL 100* 09/14/2015 0547   CO2 25 09/14/2015 0547   CO2 23 07/29/2015 0827   GLUCOSE 124* 09/14/2015 0547   GLUCOSE 96 07/29/2015 0827   BUN 26* 09/14/2015 0547   BUN 13.5 07/29/2015 0827   CREATININE 0.74 09/14/2015 0547   CREATININE 0.8 07/29/2015 0827   CALCIUM 8.6* 09/14/2015 0547   CALCIUM 9.5 07/29/2015 0827   PROT 6.7 09/06/2015 0817   PROT 6.6 07/29/2015 0827   ALBUMIN 4.3 09/06/2015 0817   ALBUMIN 4.2 07/29/2015 0827   AST 37 09/06/2015 0817   AST 15 07/29/2015 0827   ALT 26 09/06/2015 0817   ALT 18 07/29/2015 0827   ALKPHOS 87 09/06/2015 0817   ALKPHOS 80 07/29/2015 0827   BILITOT 1.1 09/06/2015 0817   BILITOT  0.78 07/29/2015 0827   GFRNONAA >60 09/14/2015 0547   GFRAA >60 09/14/2015 0547       Problem List:  Patient Active Problem List   Diagnosis Date Noted  . Palliative care encounter   . Hyponatremia 09/06/2015  . Brain metastasis ([place]) 09/06/2015  . Acute encephalopathy 09/06/2015  . Depression 09/06/2015  . Essential hypertension 09/06/2015  . DNR no code (do not resuscitate) 08/02/2015  . COPD (chronic obstructive pulmonary disease) ([place]) 05/21/2015  . Sarcoidosis ([place]) 03/25/2015  . Small cell lung cancer ([place]) 08/31/2014  . History of tobacco abuse 08/11/2014     Palliative Care Assessment & Plan     1.Code Status:  DNR    Code Status Orders        Start     Ordered   09/06/15 1456  Do not attempt resuscitation (DNR)   Continuous    Question Answer Comment  In the event of cardiac or respiratory ARREST Do not call a "code blue"   In the event of cardiac or respiratory ARREST Do not perform Intubation, CPR, defibrillation or ACLS   In the event of cardiac or respiratory ARREST Use medication by any route, position, wound care, and other measures to relive pain and suffering. May use oxygen, suction and manual treatment of airway obstruction as needed for comfort.      09/06/15 1455    Advance Directive Documentation        Most Recent Value   Type of Advance Directive  Healthcare Power of Attorney, Living will   Pre-existing out of facility DNR order (yellow form or pink MOST form)     "MOST" Form in Place?         2. Goals of Care/Additional Recommendations:  Goal is to consider further chemotherapy treatment if he is strong enough and they feel that prognosis extends months instead of weeks. Largely depends on how he does in rehab and strength gain. Also worry about continuing issues with hyponatremia complicating course.   Desire for further Chaplaincy support:yes  Psycho-social Needs: Caregiving  Support/Resources and Education on Hospice  3. Symptom Management:  Hyponatremia: managed per primary team.   Weakness: PT to see. For short term rehab.   Decreased appetite: Encourage meals - this is improving a little. Encourage supplements.  4. Palliative Prophylaxis:   Bowel Regimen and Delirium Protocol  5. Prognosis: Unable to determine - likely < 6 months  6. Discharge Planning:  [place] for rehab with Palliative care service follow-up   Thank you for allowing the Palliative Medicine Team to assist in the care of this patient.   Time In: 1440 Time Out: 1510 Total Time 69mn Prolonged Time Billed  no        APershing Proud  NP  09/14/2015, 2:31 PM  Please contact Palliative Medicine Team phone at [phone] questions and concerns.

## 2015-09-14 NOTE — Progress Notes (Signed)
  [facility] Progress Note   Subjective: Na 131 today  Filed Vitals:   09/13/15 1443 09/13/15 2142 09/14/15 0512 09/14/15 1500  BP: 118/57 133/66 124/55 122/68  Pulse: 81 71 70 77  Temp: 97.7 F (36.5 C) 98.1 F (36.7 C) 98.1 F (36.7 C) 97.4 F (36.3 C)  TempSrc: Oral Oral Oral Oral  Resp: '20 18 20 20  '$ Height:      Weight:      SpO2: 96% 96% 99% 98%   Exam: Alert, no distress, ox 3 No rash, cyanosis or gangrene Sclera anicteric, throat clear No jvd Chest clear bilat RRR no MRG ABd soft ntnd no ascites or hsm No LE or UE edema Neuro is alert, Ox 3, nf   Assessment: 1. 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 Urine studies done after tolvaptan given so they are irrelevant. Stop tolvaptan.  Would continue salt tabs (2 gm tid), fluid restriction 1000 cc/ d and start lasix 20/d.  Will sign off. Have d/w primary.  2. Small cell lung Ca w mets 3. HTN 4. DNR  Plan - as above   Kelly Splinter MD [facility] pager [phone]    cell [phone] 09/14/2015, 4:04 PM    Recent Labs Lab 09/12/15 0500 09/13/15 0440  09/14/15 0015 09/14/15 0547 09/14/15 1225  NA 124* 119*  < > 130* 131* 130*  K 3.9 3.7  --   --  4.0  --   CL 93* 87*  --   --  100*  --   CO2 24 23  --   --  25  --   GLUCOSE 119* 119*  --   --  124*  --   BUN 19 20  --   --  26*  --   CREATININE 0.53* 0.52*  --   --  0.74  --   CALCIUM 8.2* 8.2*  --   --  8.6*  --   < > = values in this interval not displayed. No results for input(s): AST, ALT, ALKPHOS, BILITOT, PROT, ALBUMIN in the last 168 hours. No results for input(s): WBC, NEUTROABS, HGB, HCT, MCV, PLT in the last 168 hours. [name] amLODipine  10 mg Oral Daily  . aspirin EC  81 mg Oral Daily  . atenolol  25 mg Oral BID  . buPROPion  150 mg Oral BID  . dexamethasone  4 mg Intravenous 4 times per day  . diclofenac  75 mg Oral BID  . enoxaparin (LOVENOX) injection  40 mg Subcutaneous Q24H  . feeding  supplement (ENSURE ENLIVE)  237 mL Oral BID BM  . folic acid  1 mg Oral BID  . furosemide  20 mg Oral Daily  . multivitamin with minerals  1 tablet Oral Daily  . nicotine  14 mg Transdermal Daily  . pantoprazole  40 mg Oral Daily  . sodium chloride  3 mL Intravenous Q12H  . sodium chloride  2 g Oral TID  . thiamine  100 mg Oral Daily     acetaminophen **OR** acetaminophen, dicyclomine, haloperidol lactate, ondansetron **OR** ondansetron (ZOFRAN) IV, polyethylene glycol, sodium chloride

## 2015-09-14 NOTE — Clinical Social Work Placement (Signed)
   CLINICAL SOCIAL WORK PLACEMENT  NOTE  Date:  09/14/2015  Patient Details  Name: Spencer Long MRN: [id number] Date of Birth: May 03, 1951  Clinical Social Work is seeking post-discharge placement for this patient at the [place] level of care (*CSW will initial, date and re-position this form in  chart as items are completed):  Yes   Patient/family provided with [place] Work Department's list of facilities offering this level of care within the geographic area requested by the patient (or if unable, by the patient's family).  Yes   Patient/family informed of their freedom to choose among providers that offer the needed level of care, that participate in Medicare, Medicaid or managed care program needed by the patient, have an available bed and are willing to accept the patient.  Yes   Patient/family informed of [facility]'s ownership interest in [facility] and [facility], as well as of the fact that they are under no obligation to receive care at these facilities.  PASRR submitted to EDS on 09/11/15     PASRR number received on 09/11/15     Existing PASRR number confirmed on       FL2 transmitted to all facilities in geographic area requested by pt/family on 09/11/15     FL2 transmitted to all facilities within larger geographic area on 09/11/15     Patient informed that his/her managed care company has contracts with or will negotiate with certain facilities, including the following:        Yes   Patient/family informed of bed offers received.  Patient chooses bed at [facility]     Physician recommends and patient chooses bed at      Patient to be transferred to [facility] on  .  Patient to be transferred to facility by       Patient family notified on   of transfer.  Name of family member notified:        PHYSICIAN Please sign FL2, Please sign DNR     Additional Comment:     _______________________________________________ Ladell Pier, LCSW 09/14/2015, 2:52 PM

## 2015-09-14 NOTE — Clinical Social Work Placement (Signed)
   CLINICAL SOCIAL WORK PLACEMENT  NOTE  Date:  09/14/2015  Patient Details  Name: Spencer Long MRN: [id number] Date of Birth: 03-10-1951  Clinical Social Work is seeking post-discharge placement for this patient at the [place] level of care (*CSW will initial, date and re-position this form in  chart as items are completed):  Yes   Patient/family provided with [place] Work Department's list of facilities offering this level of care within the geographic area requested by the patient (or if unable, by the patient's family).  Yes   Patient/family informed of their freedom to choose among providers that offer the needed level of care, that participate in Medicare, Medicaid or managed care program needed by the patient, have an available bed and are willing to accept the patient.  Yes   Patient/family informed of [facility]'s ownership interest in [facility] and [facility], as well as of the fact that they are under no obligation to receive care at these facilities.  PASRR submitted to EDS on 09/11/15     PASRR number received on 09/11/15     Existing PASRR number confirmed on       FL2 transmitted to all facilities in geographic area requested by pt/family on 09/11/15     FL2 transmitted to all facilities within larger geographic area on 09/11/15     Patient informed that his/her managed care company has contracts with or will negotiate with certain facilities, including the following:        Yes   Patient/family informed of bed offers received.  Patient chooses bed at [facility]     Physician recommends and patient chooses bed at      Patient to be transferred to   on  .  Patient to be transferred to facility by       Patient family notified on   of transfer.  Name of family member notified:        PHYSICIAN Please sign FL2, Please sign DNR     Additional Comment:     _______________________________________________ Ladell Pier, LCSW 09/14/2015, 2:52 PM

## 2015-09-14 NOTE — Progress Notes (Signed)
Progress Note   Spencer Long DJM:[id number] DOB: 04-27-1951 DOA: 09/06/2015 PCP: [name], MD   Brief Narrative:   Spencer Long is an 64 y.o. male with a PMH of small cell lung cancer with brain metastasis, status post stereotactic radiation therapy under the care of Dr. [name] and chronic hyponatremia on salt tablets, compliant with same, was brought to the hospital 09/06/15 via EMS secondary to altered mental status. Upon initial evaluation in the ED, the patient was noted to have a sodium of 119. CT of the head was stable compared to prior. His mental status is much better, he knows he is in the hospital and answering questions appropriately.   Assessment/Plan:   Principal Problem:  Acute encephalopathy secondary to hyponatremia versus cerebral edema from brain metastasis - Sodium 119---> 131 continue salt tablets and lasix and fluid restriction. Renal consulted and recommendations given.  - CT of the head stable. Likely has underlying vasogenic edema. Continue Decadron - Mental status improving. Advance diet. - palliative care consulted as per family's request.    Active Problems:  Small cell lung cancer ([place]) - Discussed case with Dr. [name] - plan for chemo outpatient when he re covers from this acute confusion.  - radiation treatment planned for Wednesday, .    Sarcoidosis (HCC)/COPD (chronic obstructive pulmonary disease) (HCC) - Continue Spiriva.    Hypertension - Continue Norvasc, atenolol, Edarbi (substitute Avapro).   Depression - Continue Wellbutrin.    DVT prophylaxis - Lovenox ordered.  Family Communication/Anticipated D/C date and plan/Code Status   Family Communication: Wife updated at the bedside. Disposition Plan: SNF, tomorrow after radiation treatment.  Anticipated d/c date. : 11/16 Code Status:     Code Status Orders        Start     Ordered   09/06/15 1456  Do not attempt resuscitation (DNR)   Continuous      Question Answer Comment  In the event of cardiac or respiratory ARREST Do not call a "code blue"   In the event of cardiac or respiratory ARREST Do not perform Intubation, CPR, defibrillation or ACLS   In the event of cardiac or respiratory ARREST Use medication by any route, position, wound care, and other measures to relive pain and suffering. May use oxygen, suction and manual treatment of airway obstruction as needed for comfort.      09/06/15 1455    Advance Directive Documentation        Most Recent Value   Type of Advance Directive  Healthcare Power of Attorney, Living will   Pre-existing out of facility DNR order (yellow form or pink MOST form)     "MOST" Form in Place?         IV Access:    Peripheral IV   Procedures and diagnostic studies:   Dg Chest 1 View  09/06/2015  CLINICAL DATA:  64 year old male with new altered mental status. Lung cancer with recently diagnosed metastatic disease to the brain. Subsequent encounter. EXAM: CHEST 1 VIEW COMPARISON:  PET-CT 08/16/2015, and earlier. FINDINGS: Right chest porta cath now in place. Stable cardiac size and mediastinal contours. Stable lung volumes. No pneumothorax, pulmonary edema, pleural effusion. Continued streaky retrocardiac opacity. Increased interstitial markings in both lungs may reflect vascular congestion, no overt edema. IMPRESSION: Stable from recent PET-CT.  No new cardiopulmonary abnormality. Electronically Signed   By: [name] M.D.   On: 09/06/2015 08:30   Ct Head Wo Contrast  09/06/2015  CLINICAL DATA:  Altered mental status, history of small cell lung carcinoma with intracranial metastatic disease EXAM: CT HEAD WITHOUT CONTRAST TECHNIQUE: Contiguous axial images were obtained from the base of the skull through the vertex without intravenous contrast. COMPARISON:  08/25/2015 FINDINGS: Bony calvarium is intact. No gross soft tissue abnormality is noted. Areas a scatter white matter ischemic change are seen. The  known metastatic lesions are not well appreciated on this exam. No significant white matter edema is noted. Motion artifact somewhat degrades the study. No acute hemorrhage is seen. IMPRESSION: No acute abnormality noted.  Mild chronic ischemic changes are seen. Electronically Signed   By: [name] M.D.   On: 09/06/2015 08:56     Medical Consultants:    Dr. Curt Bears, Oncology  Anti-Infectives:   Anti-infectives    Start     Dose/Rate Route Frequency Ordered Stop   09/06/15 1300  demeclocycline (DECLOMYCIN) tablet 150 mg  Status:  Discontinued     150 mg Oral 4 times per day 09/06/15 1219 09/14/15 1553      Subjective:   Spencer Long denies anynew complaints.   Objective:    Filed Vitals:   09/13/15 1443 09/13/15 2142 09/14/15 0512 09/14/15 1500  BP: 118/57 133/66 124/55 122/68  Pulse: 81 71 70 77  Temp: 97.7 F (36.5 C) 98.1 F (36.7 C) 98.1 F (36.7 C) 97.4 F (36.3 C)  TempSrc: Oral Oral Oral Oral  Resp: '20 18 20 20  '$ Height:      Weight:      SpO2: 96% 96% 99% 98%    Intake/Output Summary (Last 24 hours) at 09/14/15 1858 Last data filed at 09/14/15 1852  Gross per 24 hour  Intake    240 ml  Output   3275 ml  Net  -3035 ml   Filed Weights   09/06/15 1109  Weight: 72 kg (158 lb 11.7 oz)    Exam: Gen:  Alert and appears oriented. Cardiovascular:  s1s2 No M/R/G Respiratory:  Lungs CTAB Gastrointestinal:  Abdomen soft, NT/ND, + BS Extremities:  No C/E/C   Data Reviewed:    Labs: Basic Metabolic Panel:  Recent Labs Lab 09/10/15 1055 09/11/15 0930 09/12/15 0500 09/13/15 0440 09/13/15 1455 09/14/15 0015 09/14/15 0547 09/14/15 1225  NA 125* 126* 124* 119* 121* 130* 131* 130*  K 3.5 3.4* 3.9 3.7  --   --  4.0  --   CL 92* 91* 93* 87*  --   --  100*  --   CO2 '23 25 24 23  '$ --   --  25  --   GLUCOSE 129* 117* 119* 119*  --   --  124*  --   BUN '18 16 19 20  '$ --   --  26*  --   CREATININE 0.58* 0.52* 0.53* 0.52*  --   --  0.74  --     CALCIUM 8.5* 8.6* 8.2* 8.2*  --   --  8.6*  --    GFR Estimated Creatinine Clearance: 92.1 mL/min (by C-G formula based on Cr of 0.74). Liver Function Tests: No results for input(s): AST, ALT, ALKPHOS, BILITOT, PROT, ALBUMIN in the last 168 hours. CBC: No results for input(s): WBC, NEUTROABS, HGB, HCT, MCV, PLT in the last 168 hours. CBG: No results for input(s): GLUCAP in the last 168 hours. Sepsis Labs: No results for input(s): PROCALCITON, WBC, LATICACIDVEN in the last 168 hours. Microbiology Recent Results (from the past 240 hour(s))  Urine culture     Status:  None   Collection Time: 09/06/15  8:17 AM  Result Value Ref Range Status   Specimen Description URINE, CLEAN CATCH  Final   Special Requests NONE  Final   Culture   Final    NO GROWTH 1 DAY Performed at [facility]    Report Status 09/07/2015 FINAL  Final     Medications:   . amLODipine  10 mg Oral Daily  . aspirin EC  81 mg Oral Daily  . atenolol  25 mg Oral BID  . buPROPion  150 mg Oral BID  . dexamethasone  4 mg Intravenous 4 times per day  . diclofenac  75 mg Oral BID  . enoxaparin (LOVENOX) injection  40 mg Subcutaneous Q24H  . feeding supplement (ENSURE ENLIVE)  237 mL Oral BID BM  . folic acid  1 mg Oral BID  . furosemide  20 mg Oral Daily  . multivitamin with minerals  1 tablet Oral Daily  . nicotine  14 mg Transdermal Daily  . pantoprazole  40 mg Oral Daily  . sodium chloride  3 mL Intravenous Q12H  . sodium chloride  2 g Oral TID  . thiamine  100 mg Oral Daily   Continuous Infusions:    Time spent: 15  minutes.     LOS: 8 days   [facility]  [facility] Pager [phone]   *Please refer to [email or web address], password [email or web address] to get updated schedule on who will round on this patient, as hospitalists switch teams weekly. If 7PM-7AM, please contact night-coverage at [email or web address], password [email or web address] for any overnight needs.  09/14/2015, 6:58 PM

## 2015-09-14 NOTE — Telephone Encounter (Signed)
reutnred call and s.w pt and cx todays appt due to pt in hospaital

## 2015-09-14 NOTE — Discharge Summary (Addendum)
Physician Discharge Summary  Spencer Long XVQ:[id number] DOB: 12-Nov-1950 DOA: 09/06/2015  PCP: [name], MD  Admit date: 09/06/2015 Discharge date: 09/15/2015  Time spent: >30 minutes  Recommendations for Outpatient Follow-up:  1. Follow up with oncology Dr. [name] as recommended. Dr. [name] to direct decodron taper  2. F/u with radiation oncology Dr [name] in a month as scheduled  3. Follow up with sodium level in 3 days.    Discharge Diagnoses:  Principal Problem:   Acute encephalopathy Active Problems:   Small cell lung cancer (HCC)   Sarcoidosis (HCC)   COPD (chronic obstructive pulmonary disease) (HCC)   Hyponatremia   Brain metastasis (HCC)   Depression   Essential hypertension   Palliative care encounter   Discharge Condition: improved  Diet recommendation: regular diet.   Filed Weights   09/06/15 1109  Weight: 158 lb 11.7 oz (72 kg)    History of present illness:  Spencer Long is an 64 y.o. male with a PMH of small cell lung cancer with brain metastasis, status post stereotactic radiation therapy under the care of Dr. [name] and chronic hyponatremia on salt tablets, compliant with same, was brought to the hospital 09/06/15 via EMS secondary to altered mental status. Upon initial evaluation in the ED, the patient was noted to have a sodium of 119. CT of the head was stable compared to prior. His mental status is much better, he knows he is in the hospital and answering questions appropriately.   Hospital Course:   Acute encephalopathy secondary to hyponatremia versus cerebral edema from brain metastasis - Sodium 119---> 130 . He was started on salt tablets, demeclocycline, on fluids, but his sodium improved only to 121. Renal consulted and his sodium tab increased, added on lasix and fluid restriction of one liter daily. His sodium further improved to 130.  - CT of the head stable. Likely has underlying vasogenic edema. Continue Decadron on  discharge and taper as per oncology.  - Mental status improved. Advanced diet. - palliative care consulted as per family's request and recommendations given. Please see palliative care note.    Active Problems:  Small cell lung cancer ([place]) - Discussed case with Dr. [name] - plan for chemo outpatient when he re covers from this acute confusion and after rehab.  - radiation treatment planned for Wednesday, . And plan for discharge after the radiation treatment.  -patient will not get chemo or radiation therapy while at the rehab, patient will f/u with oncology and radiation oncology to determine further plan   Sarcoidosis (HCC)/COPD (chronic obstructive pulmonary disease) ([place]) - Continue Spiriva.    Hypertension - Continue Norvasc, atenolol. Now on low does lasix, bp well controlled at discharge, home meds edarbi (azilsartan) held.    Depression - Continue Wellbutrin.  Code status: DNR  Procedures:  Radiation treatment on 11/16  Consultations:  Oncology  Renal.  Palliative care  Discharge Exam: Filed Vitals:   09/15/15 0916  BP: 126/60  Pulse: 75  Temp: 98 F (36.7 C)  Resp:     General: alert afebrile comfortable Cardiovascular: s1s2 Respiratory: ctab  Discharge Instructions   Discharge Instructions    Diet - low sodium heart healthy    Complete by:  As directed   Fluids restriction to 1liter daily     Increase activity slowly    Complete by:  As directed           Current Discharge Medication List    START taking these medications  Details  acetaminophen (TYLENOL) 325 MG tablet Take 2 tablets (650 mg total) by mouth every 6 (six) hours as needed for mild pain (or Fever >/= 101). Qty: 15 tablet, Refills: 0    dexamethasone (DECADRON) 4 MG tablet Take 1 tablet (4 mg total) by mouth 4 (four) times daily. Qty: 120 tablet, Refills: 0    feeding supplement, ENSURE ENLIVE, (ENSURE ENLIVE) LIQD Take 237 mLs by mouth 2 (two) times daily between  meals. Qty: 237 mL, Refills: 12    furosemide (LASIX) 20 MG tablet Take 1 tablet (20 mg total) by mouth daily. Qty: 30 tablet, Refills: 0    polyethylene glycol (MIRALAX / GLYCOLAX) packet Take 17 g by mouth daily as needed for mild constipation. Qty: 14 each, Refills: 0    thiamine 100 MG tablet Take 1 tablet (100 mg total) by mouth daily. Qty: 30 tablet, Refills: 0      CONTINUE these medications which have CHANGED   Details  sodium chloride 1 G tablet Take 2 tablets (2 g total) by mouth 3 (three) times daily with meals. Qty: 90 tablet, Refills: 0      CONTINUE these medications which have NOT CHANGED   Details  amLODipine (NORVASC) 10 MG tablet Take 10 mg by mouth every morning.     aspirin EC 81 MG tablet Take 81 mg by mouth every morning.     atenolol (TENORMIN) 25 MG tablet Take 25 mg by mouth 2 (two) times daily.    buPROPion (WELLBUTRIN SR) 150 MG 12 hr tablet Take 1 tablet (150 mg total) by mouth 2 (two) times daily. Qty: 180 tablet, Refills: 1    dicyclomine (BENTYL) 20 MG tablet Take 20 mg by mouth every 4 (four) hours as needed for spasms.     folic acid (FOLVITE) 1 MG tablet Take 1 mg by mouth 2 (two) times daily.    loperamide (IMODIUM) 2 MG capsule Take 2 mg by mouth as needed for diarrhea or loose stools.    Multiple Vitamins-Minerals (MULTIVITAMIN ADULTS 50+ PO) Take 1 tablet by mouth every morning.     mupirocin cream (BACTROBAN) 2 % Apply 1 application topically 2 (two) times daily.    omeprazole (PRILOSEC) 40 MG capsule Take 40 mg by mouth every morning.    Probiotic Product (PROBIOTIC DAILY PO) Take 1 tablet by mouth every morning.     Tiotropium Bromide Monohydrate (SPIRIVA RESPIMAT) 1.25 MCG/ACT AERS Inhale 2 puffs into the lungs daily.    white petrolatum (VASELINE) GEL Apply 1 application topically daily as needed for lip care or dry skin.    Albuterol Sulfate (PROAIR RESPICLICK) 381 (90 BASE) MCG/ACT AEPB Inhale 2 puffs into the lungs every 4  (four) hours as needed. Qty: 1 each, Refills: 2    lidocaine-prilocaine (EMLA) cream Apply 1 application topically as needed. Qty: 30 g, Refills: 3   Associated Diagnoses: Small cell lung cancer, unspecified laterality (HCC)    prochlorperazine (COMPAZINE) 10 MG tablet Take 1 tablet (10 mg total) by mouth every 6 (six) hours as needed for nausea or vomiting. Qty: 30 tablet, Refills: 0   Associated Diagnoses: Small cell lung cancer, unspecified laterality ([place])      STOP taking these medications     Azilsartan Medoxomil (EDARBI) 80 MG TABS      diclofenac (VOLTAREN) 75 MG EC tablet      nicotine (NICODERM CQ - DOSED IN MG/24 HOURS) 14 mg/24hr patch      nicotine (NICODERM CQ - DOSED  IN MG/24 HR) 7 mg/24hr patch        Allergies  Allergen Reactions  . Varenicline     Chantix - caused strange behavior  . Isothiazolinone Chloride Rash  . Oxycodone Rash  . Quaternium-15 Rash   Follow-up Information    Follow up with [facility] .   Specialty:  Skilled Nursing Facility   Contact information:   [place] [phone]      Follow up with Eilleen Kempf., MD In 1 week.   Specialty:  Oncology   Why:  small cell lung ca with brain mets, oncology to taper decadron   Contact information:   [place] [address] [phone]        The results of significant diagnostics from this hospitalization (including imaging, microbiology, ancillary and laboratory) are listed below for reference.    Significant Diagnostic Studies: Dg Chest 1 View  09/06/2015  CLINICAL DATA:  64 year old male with new altered mental status. Lung cancer with recently diagnosed metastatic disease to the brain. Subsequent encounter. EXAM: CHEST 1 VIEW COMPARISON:  PET-CT 08/16/2015, and earlier. FINDINGS: Right chest porta cath now in place. Stable cardiac size and mediastinal contours. Stable lung volumes. No pneumothorax, pulmonary edema,  pleural effusion. Continued streaky retrocardiac opacity. Increased interstitial markings in both lungs may reflect vascular congestion, no overt edema. IMPRESSION: Stable from recent PET-CT.  No new cardiopulmonary abnormality. Electronically Signed   By: [name] M.D.   On: 09/06/2015 08:30   Ct Head Wo Contrast  09/06/2015  CLINICAL DATA:  Altered mental status, history of small cell lung carcinoma with intracranial metastatic disease EXAM: CT HEAD WITHOUT CONTRAST TECHNIQUE: Contiguous axial images were obtained from the base of the skull through the vertex without intravenous contrast. COMPARISON:  08/25/2015 FINDINGS: Bony calvarium is intact. No gross soft tissue abnormality is noted. Areas a scatter white matter ischemic change are seen. The known metastatic lesions are not well appreciated on this exam. No significant white matter edema is noted. Motion artifact somewhat degrades the study. No acute hemorrhage is seen. IMPRESSION: No acute abnormality noted.  Mild chronic ischemic changes are seen. Electronically Signed   By: [name] M.D.   On: 09/06/2015 08:56   Mr Jeri Cos XI Contrast  08/25/2015  CLINICAL DATA:  Small cell lung cancer.  Brain metastases. EXAM: MRI HEAD WITHOUT AND WITH CONTRAST TECHNIQUE: Multiplanar, multiecho pulse sequences of the brain and surrounding structures were obtained without and with intravenous contrast. CONTRAST:  67m MULTIHANCE GADOBENATE DIMEGLUMINE 529 MG/ML IV SOLN COMPARISON:  MRI brain 08/09/2015 and 02/03/2015. FINDINGS: Enhancing lesion along the inferior aspect of the left cerebellum measures 1.1 x 1.0 x 0.9 cm. There is a lesion in the inferior left frontal lobe on image 105 of series 10 just above the sylvian fissure which measures 2.4 mm. A 4 x 3 mm lesion is present in the right parietal lobe on image 118. A focal area of subcortical T2 shine through is present in the left frontal lobe. There is no restricted diffusion. Extensive periventricular and  subcortical T2 changes are present bilaterally. There are remote lacunar infarcts in the left thalamus. More mild white matter changes extend into the brainstem. Flow is present in the major intracranial arteries. The globes and orbits are intact. The paranasal sinuses are clear. There is some fluid in the inferior mastoid air cells. Height there is no increased T1 signal within the clivus. Question previous radiation treatment.  Previously seen metastatic disease the clivus is no longer present. Advanced degenerative changes are again noted in the upper cervical spine. IMPRESSION: 1. Enhancing metastatic lesion confirmed along the inferior aspect of the left cerebellum. 2. At least 1 2 mm lesion is present in the posterior left frontal lobe. 3. A 4 x 3 mm lesion is present in the right parietal lobe. 4. Diffuse atrophy and white matter disease otherwise likely reflects the sequela of chronic microvascular ischemia. 5. Stable advanced degenerative changes within the upper cervical spine. Electronically Signed   By: San Morelle M.D.   On: 08/25/2015 19:53   Ir Fluoro Guide Cv Line Right  08/26/2015  ADDENDUM REPORT: 08/26/2015 11:13 ADDENDUM: Correction to MEDICATIONS AND MEDICAL HISTORY: 4 mg Versed, 50 mcg fentanyl Electronically Signed   By: Jerilynn Mages.  Shick M.D.   On: 08/26/2015 11:13  08/26/2015  CLINICAL DATA:  SMALL CELL LUNG CARCINOMA, ACCESS FOR CHEMOTHERAPY EXAM: RIGHT INTERNAL JUGULAR SINGLE LUMEN POWER PORT CATHETER INSERTION Date:  10/27/201610/27/2016 10:44 am Radiologist:  M. Daryll Brod, MD Guidance:  Ultrasound and fluoroscopic FLUOROSCOPY TIME:  30 seconds MEDICATIONS AND MEDICAL HISTORY: 2 g Ancefadministered within 1 hour of the procedure.2 mg Versed, 100 mcg fentanyl ANESTHESIA/SEDATION: 30 minutes CONTRAST:  None. COMPLICATIONS: None immediate PROCEDURE: Informed consent was obtained from the patient following explanation of the procedure, risks, benefits and alternatives. The patient  understands, agrees and consents for the procedure. All questions were addressed. A time out was performed. Maximal barrier sterile technique utilized including caps, mask, sterile gowns, sterile gloves, large sterile drape, hand hygiene, and 2% chlorhexidine scrub. Under sterile conditions and local anesthesia, right internal jugular micropuncture venous access was performed. Access was performed with ultrasound. Images were obtained for documentation. A guide wire was inserted followed by a transitional dilator. This allowed insertion of a guide wire and catheter into the IVC. Measurements were obtained from the SVC / RA junction back to the right IJ venotomy site. In the right infraclavicular chest, a subcutaneous pocket was created over the second anterior rib. This was done under sterile conditions and local anesthesia. 1% lidocaine with epinephrine was utilized for this. A 2.5 cm incision was made in the skin. Blunt dissection was performed to create a subcutaneous pocket over the right pectoralis major muscle. The pocket was flushed with saline vigorously. There was adequate hemostasis. The port catheter was assembled and checked for leakage. The port catheter was secured in the pocket with two retention sutures. The tubing was tunneled subcutaneously to the right venotomy site and inserted into the SVC/RA junction through a valved peel-away sheath. Position was confirmed with fluoroscopy. Images were obtained for documentation. The patient tolerated the procedure well. No immediate complications. Incisions were closed in a two layer fashion with 4 - 0 Vicryl suture. Dermabond was applied to the skin. The port catheter was accessed, blood was aspirated followed by saline and heparin flushes. Needle was removed. A dry sterile dressing was applied. IMPRESSION: Ultrasound and fluoroscopically guided right internal jugular single lumen power port catheter insertion. Tip in the SVC/RA junction. Catheter ready for  use. Electronically Signed: By: Jerilynn Mages.  Shick M.D. On: 08/26/2015 11:08   Ir US Guide Vasc Access Right  08/26/2015  ADDENDUM REPORT: 08/26/2015 11:13 ADDENDUM: Correction to MEDICATIONS AND MEDICAL HISTORY: 4 mg Versed, 50 mcg fentanyl Electronically Signed   By: Jerilynn Mages.  Shick M.D.   On: 08/26/2015 11:13  08/26/2015  CLINICAL DATA:  SMALL CELL LUNG CARCINOMA, ACCESS FOR CHEMOTHERAPY EXAM: RIGHT INTERNAL JUGULAR  SINGLE LUMEN POWER PORT CATHETER INSERTION Date:  10/27/201610/27/2016 10:44 am Radiologist:  M. Daryll Brod, MD Guidance:  Ultrasound and fluoroscopic FLUOROSCOPY TIME:  30 seconds MEDICATIONS AND MEDICAL HISTORY: 2 g Ancefadministered within 1 hour of the procedure.2 mg Versed, 100 mcg fentanyl ANESTHESIA/SEDATION: 30 minutes CONTRAST:  None. COMPLICATIONS: None immediate PROCEDURE: Informed consent was obtained from the patient following explanation of the procedure, risks, benefits and alternatives. The patient understands, agrees and consents for the procedure. All questions were addressed. A time out was performed. Maximal barrier sterile technique utilized including caps, mask, sterile gowns, sterile gloves, large sterile drape, hand hygiene, and 2% chlorhexidine scrub. Under sterile conditions and local anesthesia, right internal jugular micropuncture venous access was performed. Access was performed with ultrasound. Images were obtained for documentation. A guide wire was inserted followed by a transitional dilator. This allowed insertion of a guide wire and catheter into the IVC. Measurements were obtained from the SVC / RA junction back to the right IJ venotomy site. In the right infraclavicular chest, a subcutaneous pocket was created over the second anterior rib. This was done under sterile conditions and local anesthesia. 1% lidocaine with epinephrine was utilized for this. A 2.5 cm incision was made in the skin. Blunt dissection was performed to create a subcutaneous pocket over the right  pectoralis major muscle. The pocket was flushed with saline vigorously. There was adequate hemostasis. The port catheter was assembled and checked for leakage. The port catheter was secured in the pocket with two retention sutures. The tubing was tunneled subcutaneously to the right venotomy site and inserted into the SVC/RA junction through a valved peel-away sheath. Position was confirmed with fluoroscopy. Images were obtained for documentation. The patient tolerated the procedure well. No immediate complications. Incisions were closed in a two layer fashion with 4 - 0 Vicryl suture. Dermabond was applied to the skin. The port catheter was accessed, blood was aspirated followed by saline and heparin flushes. Needle was removed. A dry sterile dressing was applied. IMPRESSION: Ultrasound and fluoroscopically guided right internal jugular single lumen power port catheter insertion. Tip in the SVC/RA junction. Catheter ready for use. Electronically Signed: By: Jerilynn Mages.  Shick M.D. On: 08/26/2015 11:08    Microbiology: Recent Results (from the past 240 hour(s))  Urine culture     Status: None   Collection Time: 09/06/15  8:17 AM  Result Value Ref Range Status   Specimen Description URINE, CLEAN CATCH  Final   Special Requests NONE  Final   Culture   Final    NO GROWTH 1 DAY Performed at [facility]    Report Status 09/07/2015 FINAL  Final     Labs: Basic Metabolic Panel:  Recent Labs Lab 09/10/15 1055 09/11/15 0930 09/12/15 0500 09/13/15 0440 09/13/15 1455 09/14/15 0015 09/14/15 0547 09/14/15 1225  NA 125* 126* 124* 119* 121* 130* 131* 130*  K 3.5 3.4* 3.9 3.7  --   --  4.0  --   CL 92* 91* 93* 87*  --   --  100*  --   CO2 '23 25 24 23  '$ --   --  25  --   GLUCOSE 129* 117* 119* 119*  --   --  124*  --   BUN '18 16 19 20  '$ --   --  26*  --   CREATININE 0.58* 0.52* 0.53* 0.52*  --   --  0.74  --   CALCIUM 8.5* 8.6* 8.2* 8.2*  --   --  8.6*  --    Liver Function Tests: No results for  input(s): AST, ALT, ALKPHOS, BILITOT, PROT, ALBUMIN in the last 168 hours. No results for input(s): LIPASE, AMYLASE in the last 168 hours. No results for input(s): AMMONIA in the last 168 hours. CBC: No results for input(s): WBC, NEUTROABS, HGB, HCT, MCV, PLT in the last 168 hours. Cardiac Enzymes: No results for input(s): CKTOTAL, CKMB, CKMBINDEX, TROPONINI in the last 168 hours. BNP: BNP (last 3 results) No results for input(s): BNP in the last 8760 hours.  ProBNP (last 3 results) No results for input(s): PROBNP in the last 8760 hours.  CBG: No results for input(s): GLUCAP in the last 168 hours.     Signed:  Florencia Reasons MD PhD  [facility] 09/15/2015, 2:36 PM

## 2015-09-15 ENCOUNTER — Ambulatory Visit
Admit: 2015-09-15 | Discharge: 2015-09-15 | Disposition: A | Discharge status: Home / Self Care | Payer: Commercial Managed Care - HMO | Attending: Radiation Oncology | Admitting: Radiation Oncology

## 2015-09-15 MED ORDER — POLYETHYLENE GLYCOL 3350 17 G PO PACK: 17.0000 g | PACK | Freq: Every day | ORAL | Status: DC | PRN

## 2015-09-15 MED ORDER — ENSURE ENLIVE PO LIQD: 237.0000 mL | Freq: Two times a day (BID) | ORAL | Status: DC

## 2015-09-15 MED ORDER — ACETAMINOPHEN 325 MG PO TABS: 650.0000 mg | ORAL_TABLET | Freq: Four times a day (QID) | ORAL | Status: DC | PRN

## 2015-09-15 MED ORDER — HEPARIN SOD (PORK) LOCK FLUSH 100 UNIT/ML IV SOLN
500.0000 [IU] | INTRAVENOUS | Status: DC | PRN
  Administered 2015-09-15: 500 [IU]
  Filled 2015-09-15 (×2): qty 5

## 2015-09-15 MED ORDER — HEPARIN SOD (PORK) LOCK FLUSH 100 UNIT/ML IV SOLN
500.0000 [IU] | INTRAVENOUS | Status: DC
  Administered 2015-09-15: 500 [IU]
  Filled 2015-09-15: qty 5

## 2015-09-15 MED ORDER — FUROSEMIDE 20 MG PO TABS: 20.0000 mg | ORAL_TABLET | Freq: Every day | ORAL | Status: AC

## 2015-09-15 MED ORDER — DEXAMETHASONE 4 MG PO TABS: 4.0000 mg | ORAL_TABLET | Freq: Four times a day (QID) | ORAL | Status: AC

## 2015-09-15 MED ORDER — THIAMINE HCL 100 MG PO TABS: 100.0000 mg | ORAL_TABLET | Freq: Every day | ORAL | Status: DC

## 2015-09-15 MED ORDER — SODIUM CHLORIDE 1 G PO TABS: 2.0000 g | ORAL_TABLET | Freq: Three times a day (TID) | ORAL | Status: DC

## 2015-09-15 NOTE — Progress Notes (Signed)
Clinical Social Work Note-Late Entry  CSW continuing to follow for disposition planning.  CSW spoke with pt wife via telephone regarding SNF bed offers.   Pt wife expressed that she would really like pt to go to [facility]. CSW explained to pt wife that [facility] initially declined pt, but likely on the basis that facility does not accept pt while pt undergoing treatment. Pt wife states that her understanding is that pt has one radiation treatment on Wednesday and that pt chemotherapy will be on hold until pt completes rehab. CSW discussed with pt wife that CSW will clarify with pt oncologist and then speak to [facility].   CSW spoke with pt oncologist RN to clarify plan. Per oncologist RN, Dr. [name] is not going to treat pt with chemo until he is stronger and done with rehab.Per oncologist RN, pt needs to keep his nov and dec appts as scheduled for lab and MD. Dr [name] will decide about further treatment after he sees him.  CSW contacted [facility] to update that after radiation treatment on Wednesday that pt will not have chemotherapy until pt completes rehab. [facility] reviewed information and determined that they will be able to offer pt a bed as long as chemotherapy or radiation will not be done while pt is in rehab.   CSW discussed with pt wife and pt wife pleased as she was not satisfied with any other facilities.   CSW contacted [employer] and initiated insurance authorization with insurance company.   CSW to continue to follow to provide support and assist with pt disposition planning.  Alison Murray, MSW, [place] Work [phone]

## 2015-09-15 NOTE — NC FL2 (Signed)
[place] LEVEL OF CARE SCREENING TOOL     IDENTIFICATION  Patient Name: Spencer Long Birthdate: 1951/03/01 Sex: male Admission Date (Current Location): 09/06/2015  [facility] and [place] Number: Herbalist and Address:  [facility] [place], [place] [address], [place], [place] [address]      Provider Number: [id number]  Attending Physician Name and Address:  Florencia Reasons, MD  Relative Name and Phone Number:       Current Level of Care: Hospital Recommended Level of Care: [place] Prior Approval Number:    Date Approved/Denied:   PASRR Number: [id number]  Discharge Plan: SNF    Current Diagnoses: Patient Active Problem List   Diagnosis Date Noted  . Palliative care encounter   . Hyponatremia 09/06/2015  . Brain metastasis ([place]) 09/06/2015  . Acute encephalopathy 09/06/2015  . Depression 09/06/2015  . Essential hypertension 09/06/2015  . DNR no code (do not resuscitate) 08/02/2015  . COPD (chronic obstructive pulmonary disease) ([place]) 05/21/2015  . Sarcoidosis ([place]) 03/25/2015  . Small cell lung cancer ([place]) 08/31/2014  . History of tobacco abuse 08/11/2014    Orientation ACTIVITIES/SOCIAL BLADDER RESPIRATION    Self, Time, Situation, Place    Incontinent Normal  BEHAVIORAL SYMPTOMS/MOOD NEUROLOGICAL BOWEL NUTRITION STATUS      Incontinent Diet  PHYSICIAN VISITS COMMUNICATION OF NEEDS Height & Weight Skin    Verbally '5\' 8"'$  (172.7 cm) 143 lbs. Surgical wounds          AMBULATORY STATUS RESPIRATION    Assist extensive Normal      Personal Care Assistance Level of Assistance  Bathing, Dressing Bathing Assistance: Limited assistance   Dressing Assistance: Limited assistance      Functional Limitations Info  Sight Sight Info: Adequate           SPECIAL CARE FACTORS FREQUENCY  PT (By licensed PT)     PT Frequency: 5x             Additional Factors Info  Code Status, Allergies,  Psychotropic Code Status Info: DNR Allergies Info: VARENICLINE, ISOTHIAZOLINONE CHLORIDE, OXYCODONE, QUATERNIUM-15 Psychotropic Info: medications         Current Medications (09/15/2015): Current Facility-Administered Medications  Medication Dose Route Frequency Provider Last Rate Last Dose  . acetaminophen (TYLENOL) tablet 650 mg  650 mg Oral Q6H PRN Venetia Maxon Rama, MD       Or  . acetaminophen (TYLENOL) suppository 650 mg  650 mg Rectal Q6H PRN Venetia Maxon Rama, MD   650 mg at 09/06/15 1511  . amLODipine (NORVASC) tablet 10 mg  10 mg Oral Daily Venetia Maxon Rama, MD   10 mg at 09/15/15 0918  . aspirin EC tablet 81 mg  81 mg Oral Daily Venetia Maxon Rama, MD   81 mg at 09/15/15 0918  . atenolol (TENORMIN) tablet 25 mg  25 mg Oral BID Venetia Maxon Rama, MD   25 mg at 09/15/15 0919  . buPROPion (WELLBUTRIN SR) 12 hr tablet 150 mg  150 mg Oral BID Venetia Maxon Rama, MD   150 mg at 09/15/15 0919  . dexamethasone (DECADRON) injection 4 mg  4 mg Intravenous 4 times per day Venetia Maxon Rama, MD   4 mg at 09/15/15 7846  . diclofenac (VOLTAREN) EC tablet 75 mg  75 mg Oral BID Venetia Maxon Rama, MD   75 mg at 09/15/15 0919  . dicyclomine (BENTYL) tablet 20 mg  20 mg Oral Q4H PRN Venetia Maxon Rama, MD      .  enoxaparin (LOVENOX) injection 40 mg  40 mg Subcutaneous Q24H Venetia Maxon Rama, MD   40 mg at 09/14/15 1505  . feeding supplement (ENSURE ENLIVE) (ENSURE ENLIVE) liquid 237 mL  237 mL Oral BID BM Satira Anis Ward, RD   237 mL at 09/15/15 0928  . folic acid (FOLVITE) tablet 1 mg  1 mg Oral BID Venetia Maxon Rama, MD   1 mg at 09/15/15 7782  . furosemide (LASIX) tablet 20 mg  20 mg Oral Daily [name], MD   20 mg at 09/15/15 4235  . haloperidol lactate (HALDOL) injection 2 mg  2 mg Intravenous Q6H PRN Hosie Poisson, MD      . multivitamin with minerals tablet 1 tablet  1 tablet Oral Daily Venetia Maxon Rama, MD   1 tablet at 09/15/15 [id number]  . nicotine (NICODERM CQ - dosed in mg/24 hours) patch 14 mg  14  mg Transdermal Daily Venetia Maxon Rama, MD   14 mg at 09/15/15 0921  . ondansetron (ZOFRAN) tablet 4 mg  4 mg Oral Q6H PRN Christina P Rama, MD       Or  . ondansetron (ZOFRAN) injection 4 mg  4 mg Intravenous Q6H PRN Christina P Rama, MD      . pantoprazole (PROTONIX) EC tablet 40 mg  40 mg Oral Daily Venetia Maxon Rama, MD   40 mg at 09/15/15 0919  . polyethylene glycol (MIRALAX / GLYCOLAX) packet 17 g  17 g Oral Daily PRN Christina P Rama, MD      . sodium chloride 0.9 % injection 10-40 mL  10-40 mL Intracatheter PRN Venetia Maxon Rama, MD   10 mL at 09/09/15 0545  . sodium chloride 0.9 % injection 3 mL  3 mL Intravenous Q12H Venetia Maxon Rama, MD   3 mL at 09/14/15 2103  . sodium chloride tablet 2 g  2 g Oral TID [name], MD   2 g at 09/15/15 4315  . thiamine (VITAMIN B-1) tablet 100 mg  100 mg Oral Daily Hosie Poisson, MD   100 mg at 09/15/15 4008   Do not use this list as official medication orders. Please verify with discharge summary.  Discharge Medications:   Medication List    ASK your doctor about these medications        Albuterol Sulfate 108 (90 BASE) MCG/ACT Aepb  Commonly known as:  PROAIR RESPICLICK  Inhale 2 puffs into the lungs every 4 (four) hours as needed.     amLODipine 10 MG tablet  Commonly known as:  NORVASC  Take 10 mg by mouth every morning.     aspirin EC 81 MG tablet  Take 81 mg by mouth every morning.     atenolol 25 MG tablet  Commonly known as:  TENORMIN  Take 25 mg by mouth 2 (two) times daily.     buPROPion 150 MG 12 hr tablet  Commonly known as:  WELLBUTRIN SR  Take 1 tablet (150 mg total) by mouth 2 (two) times daily.     diclofenac 75 MG EC tablet  Commonly known as:  VOLTAREN  Take 75 mg by mouth 2 (two) times daily.     dicyclomine 20 MG tablet  Commonly known as:  BENTYL  Take 20 mg by mouth every 4 (four) hours as needed for spasms.     EDARBI 80 MG Tabs  Generic drug:  Azilsartan Medoxomil  Take 80 mg by mouth daily.      folic acid 1 MG  tablet  Commonly known as:  FOLVITE  Take 1 mg by mouth 2 (two) times daily.     lidocaine-prilocaine cream  Commonly known as:  EMLA  Apply 1 application topically as needed.     loperamide 2 MG capsule  Commonly known as:  IMODIUM  Take 2 mg by mouth as needed for diarrhea or loose stools.     MULTIVITAMIN ADULTS 50+ PO  Take 1 tablet by mouth every morning.     mupirocin cream 2 %  Commonly known as:  BACTROBAN  Apply 1 application topically 2 (two) times daily.     nicotine 14 mg/24hr patch  Commonly known as:  NICODERM CQ - dosed in mg/24 hours  Place 1 patch (14 mg total) onto the skin daily.     nicotine 7 mg/24hr patch  Commonly known as:  NICODERM CQ - dosed in mg/24 hr  Place 1 patch (7 mg total) onto the skin daily.     omeprazole 40 MG capsule  Commonly known as:  PRILOSEC  Take 40 mg by mouth every morning.     PROBIOTIC DAILY PO  Take 1 tablet by mouth every morning.     prochlorperazine 10 MG tablet  Commonly known as:  COMPAZINE  Take 1 tablet (10 mg total) by mouth every 6 (six) hours as needed for nausea or vomiting.     sodium chloride 1 G tablet  Take 1 g by mouth 3 (three) times daily.     SPIRIVA RESPIMAT 1.25 MCG/ACT Aers  Generic drug:  Tiotropium Bromide Monohydrate  Inhale 2 puffs into the lungs daily.     white petrolatum Gel  Commonly known as:  VASELINE  Apply 1 application topically daily as needed for lip care or dry skin.        Relevant Imaging Results:  Relevant Lab Results:  Recent Labs    Additional Information    Ludwig Clarks, LCSW

## 2015-09-15 NOTE — Progress Notes (Signed)
PT Cancellation Note  Patient Details Name: Spencer Long MRN: [id number] DOB: 1950-12-08   Cancelled Treatment:     Pt out of room at [facility] then plans to D/C to SNF today.   Nathanial Rancher 09/15/2015, 1:15 PM

## 2015-09-15 NOTE — Progress Notes (Signed)
Reviewed discharge information with patient and caregiver. Answered all questions.

## 2015-09-15 NOTE — Progress Notes (Signed)
Patient for d/c today to SNF bed at [facility]- Wife and patient agreeable to this plan- hopeful for short SNF stay and home by Thanksgiving-  plan transfer via EMS. Eduard Clos, MSW, [place]

## 2015-09-15 NOTE — Progress Notes (Signed)
Nutrition Follow-up  DOCUMENTATION CODES:   Not applicable  INTERVENTION:  - Continue Ensure Enlive BID and use this to give medications due to need for fluid restriction - Continue 1000 mL fluid restriction per MD - RD will continue to monitor for needs  NUTRITION DIAGNOSIS:   Increased nutrient needs related to catabolic illness, cancer and cancer related treatments as evidenced by estimated needs.  GOAL:   Patient will meet greater than or equal to 90% of their needs -met on average with meals and supplements  MONITOR:   PO intake, Supplement acceptance, Labs, I & O's  REASON FOR ASSESSMENT:   Consult Diet education  ASSESSMENT:   Spencer Long is an 64 y.o. male with a PMH of small cell lung cancer with brain metastasis, status post stereotactic radiation therapy under the care of Dr. [name] and chronic hyponatremia on salt tablets, compliant with same, was brought to the hospital via EMS secondary to altered mental status  11/16 Per chart review, pt ate 50% of all meals 11/14 with no documented intakes yesterday (11/15). Visualized breakfast tray with 100% completion of oatmeal and eggs; RN reports pt did very well with this meal.   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 Meet w wife please as pt won't remember well.  Wife is not in room at this time but pt states she will be here shortly. Will provide her with handouts from [employer] outlining what constitutes fluids, fluid content of common foods, and remind her that 1000 mL fluid restriction is equivalent to 34 ounces.   Pt likely meeting needs on average with meals and supplements at this time. Medications reviewed; 2 gram Na tablets TID for chronic hyponatremia. Labs reviewed; Na: 130 mmol/L, Cl: 100 mmol/L, BUN elevated, Ca: 8.6 mg/dL.   11/14 - Per chart review, pt ate 95% breakfast and 5% lunch 11/11 and 75% breakfast and 25% lunch and dinner  yesterday (11/13).  - Pt reports good appetite considering that he has not been moving much since admission.  - He was drinking Ensure Enlive PTA and continues to drink it at least once/day when he takes AM medications.  - Wife is at bedside and states that pt ate bacon, eggs, drank juice, and two cups of coffee this AM.  - She is very complimentary of the food and staff, but feels that pt receives meals too close together for him to be able to eat any of them adequately.  11/8 - Admitted with Sodium of 119 mmol/L.  -Spoke with patient's daughter at bedside. Pt was unable to answer questions.  - Daughter said pt had a good appetite at home, no chewing or swalling issues, no nausea/vomitting. - Nutrition-Focused physical exam completed. Findings are mild fat depletion, no muscle depletion, and no edema.  - Pt exhibits 8#/4.4% insignificant wt loss in 10 days. Wt was stable prior to that. Likely cause of wt loss is increased nutrient needs related to cancer w/ mets.  Will provide Ensure Enlive during stay.   Diet Order:  Diet regular Room service appropriate?: Yes with Assist; Fluid consistency:: Thin; Fluid restriction:: Other (see comments)  Skin:  Wound (see comment) (Laceration to L elbow)  Last BM:  11/14  Height:   Ht Readings from Last 1 Encounters:  09/06/15 _0  (1.753 m)    Weight:   Wt Readings from Last 1 Encounters:  09/06/15 158 lb 11.7 oz (72 kg)    Ideal Body Weight:  72.72 kg  BMI:  Body mass index is 23.43 kg/(m^2).  Estimated Nutritional Needs:   Kcal:  3200-3794  Protein:  85-110 grams  Fluid:  1 L/day per MD  EDUCATION NEEDS:   No education needs identified at this time     Jarome Matin, RD, LDN Inpatient Clinical Dietitian Pager # [phone] After hours/weekend pager # [phone]

## 2015-09-15 NOTE — Progress Notes (Signed)
Report called to [employer] at [facility].  Barbee Shropshire. Brigitte Pulse, RN

## 2015-09-16 ENCOUNTER — Non-Acute Institutional Stay (SKILLED_NURSING_FACILITY): Payer: Commercial Managed Care - HMO | Admitting: Adult Health

## 2015-09-16 ENCOUNTER — Encounter: Payer: Self-pay | Admitting: Adult Health

## 2015-09-16 DIAGNOSIS — J449 Chronic obstructive pulmonary disease, unspecified: Secondary | ICD-10-CM | POA: Diagnosis not present

## 2015-09-16 DIAGNOSIS — R5381 Other malaise: Secondary | ICD-10-CM

## 2015-09-16 DIAGNOSIS — F32A Depression, unspecified: Secondary | ICD-10-CM

## 2015-09-16 DIAGNOSIS — D869 Sarcoidosis, unspecified: Secondary | ICD-10-CM | POA: Diagnosis not present

## 2015-09-16 DIAGNOSIS — K219 Gastro-esophageal reflux disease without esophagitis: Secondary | ICD-10-CM | POA: Diagnosis not present

## 2015-09-16 DIAGNOSIS — F329 Major depressive disorder, single episode, unspecified: Secondary | ICD-10-CM | POA: Diagnosis not present

## 2015-09-16 DIAGNOSIS — G936 Cerebral edema: Secondary | ICD-10-CM

## 2015-09-16 DIAGNOSIS — K5900 Constipation, unspecified: Secondary | ICD-10-CM | POA: Diagnosis not present

## 2015-09-16 DIAGNOSIS — I10 Essential (primary) hypertension: Secondary | ICD-10-CM | POA: Diagnosis not present

## 2015-09-16 DIAGNOSIS — Z87891 Personal history of nicotine dependence: Secondary | ICD-10-CM

## 2015-09-16 DIAGNOSIS — E871 Hypo-osmolality and hyponatremia: Secondary | ICD-10-CM

## 2015-09-16 DIAGNOSIS — C3490 Malignant neoplasm of unspecified part of unspecified bronchus or lung: Secondary | ICD-10-CM | POA: Diagnosis not present

## 2015-09-16 NOTE — Progress Notes (Signed)
Patient ID: Spencer Long, male   DOB: January 14, 1951, 64 y.o.   MRN: [id number]    DATE:  09/16/2015   MRN:  [id number]  BIRTHDAY: 29-Sep-1951  Facility:  Nursing Home Location:  [place] Room Number: 294-7  LEVEL OF CARE:  SNF [phone])  Contact Information    Name Relation Home Work [place] Spouse [phone]  [phone]      Chief Complaint  Patient presents with  . Hospitalization Follow-up    Physical deconditioning, hyponatremia, small cell lung cancer, sarcoidosis, COPD, hypertension, depression, tobacco abuse, constipation, GERD and cerebral edema    HISTORY OF PRESENT ILLNESS:  This is a 64 year old male who has been admitted to [facility] on 09/15/15 from [facility]. He has PMH of small cell lung cancer with brain metastasis S/P stereotactic radiation therapy under the care of Dr. [name] and chronic hyponatremia on salt tablets. He was brought to Hospital via EMS secondary to altered mental status. He was found to have sodium 119. CT of the head was stable compared to prior studies. He was treated with salt tablets and Lasix and fluid restriction. Discharge sodium level is 130. CT of the head stable. Likely has underlying vasogenic edema.  He has been admitted for a short-term rehabilitation.  PAST MEDICAL HISTORY:  Past Medical History  Diagnosis Date  . Hypertension     under control, has been on med. x 15-20 yrs.  [name] GERD (gastroesophageal reflux disease)   . Infection of elbow 2011    right-no infection at present , but has skin lesions present  . Sarcoidosis ([place])     "tends to have skin flareups" and presently experiencing now"feet, legs and arms"  . Chronic hyponatremia     Dr. Buddy Duty follows  . Radiation 02/11/15-02/25/15    brain 25 Gy  . DNR no code (do not resuscitate) 08/02/2015  . Lung cancer [facility])      CURRENT MEDICATIONS: Reviewed  Patient's Medications  New Prescriptions   No medications  on file  Previous Medications   ACETAMINOPHEN (TYLENOL) 325 MG TABLET    Take 2 tablets (650 mg total) by mouth every 6 (six) hours as needed for mild pain (or Fever >/= 101).   ALBUTEROL SULFATE (PROAIR RESPICLICK) 749 (90 BASE) MCG/ACT AEPB    Inhale 2 puffs into the lungs every 4 (four) hours as needed.   AMLODIPINE (NORVASC) 10 MG TABLET    Take 10 mg by mouth every morning.    ASPIRIN EC 81 MG TABLET    Take 81 mg by mouth every morning.    ATENOLOL (TENORMIN) 25 MG TABLET    Take 25 mg by mouth 2 (two) times daily.   BUPROPION (WELLBUTRIN SR) 150 MG 12 HR TABLET    Take 1 tablet (150 mg total) by mouth 2 (two) times daily.   DEXAMETHASONE (DECADRON) 4 MG TABLET    Take 1 tablet (4 mg total) by mouth 4 (four) times daily.   DICYCLOMINE (BENTYL) 20 MG TABLET    Take 20 mg by mouth every 4 (four) hours as needed for spasms.    FEEDING SUPPLEMENT, ENSURE ENLIVE, (ENSURE ENLIVE) LIQD    Take 237 mLs by mouth 2 (two) times daily between meals.   FOLIC ACID (FOLVITE) 1 MG TABLET    Take 1 mg by mouth 2 (two) times daily.   FUROSEMIDE (LASIX) 20 MG TABLET    Take 1 tablet (20 mg total)  by mouth daily.   LIDOCAINE-PRILOCAINE (EMLA) CREAM    Apply 1 application topically as needed.   LOPERAMIDE (IMODIUM) 2 MG CAPSULE    Take 2 mg by mouth as needed for diarrhea or loose stools.   MULTIPLE VITAMINS-MINERALS (MULTIVITAMIN ADULTS 50+ PO)    Take 1 tablet by mouth every morning.    MUPIROCIN CREAM (BACTROBAN) 2 %    Apply 1 application topically 2 (two) times daily.   OMEPRAZOLE (PRILOSEC) 40 MG CAPSULE    Take 40 mg by mouth every morning.   POLYETHYLENE GLYCOL (MIRALAX / GLYCOLAX) PACKET    Take 17 g by mouth daily as needed for mild constipation.   PROBIOTIC PRODUCT (PROBIOTIC DAILY PO)    Take 1 tablet by mouth every morning.    PROCHLORPERAZINE (COMPAZINE) 10 MG TABLET    Take 1 tablet (10 mg total) by mouth every 6 (six) hours as needed for nausea or vomiting.   SODIUM CHLORIDE 1 G TABLET    Take  2 tablets (2 g total) by mouth 3 (three) times daily with meals.   THIAMINE 100 MG TABLET    Take 1 tablet (100 mg total) by mouth daily.   TIOTROPIUM BROMIDE MONOHYDRATE (SPIRIVA RESPIMAT) 1.25 MCG/ACT AERS    Inhale 2 puffs into the lungs daily.   WHITE PETROLATUM (VASELINE) GEL    Apply 1 application topically daily as needed for lip care or dry skin.  Modified Medications   No medications on file  Discontinued Medications   No medications on file     Allergies  Allergen Reactions  . Varenicline     Chantix - caused strange behavior  . Isothiazolinone Chloride Rash  . Oxycodone Rash  . Quaternium-15 Rash     REVIEW OF SYSTEMS:  GENERAL: no change in appetite, no fatigue, no weight changes, no fever, chills or weakness EYES: Denies change in vision, dry eyes, eye pain, itching or discharge EARS: Denies change in hearing, ringing in ears, or earache NOSE: Denies nasal congestion or epistaxis MOUTH and THROAT: Denies oral discomfort, gingival pain or bleeding, pain from teeth or hoarseness   RESPIRATORY: no cough, SOB, DOE, wheezing, hemoptysis CARDIAC: no chest pain, edema or palpitations GI: no abdominal pain, diarrhea, constipation, heart burn, nausea or vomiting GU: Denies dysuria, frequency, hematuria, incontinence, or discharge PSYCHIATRIC: Denies feeling of depression or anxiety. No report of hallucinations, insomnia, paranoia, or agitation   PHYSICAL EXAMINATION  GENERAL APPEARANCE: Well nourished. In no acute distress. Normal body habitus SKIN:  Skin is warm and dry.  HEAD: Normal in size and contour. No evidence of trauma EYES: Lids open and close normally. No blepharitis, entropion or ectropion. PERRL. Conjunctivae are clear and sclerae are white. Lenses are without opacity EARS: Pinnae are normal. Patient hears normal voice tunes of the examiner MOUTH and THROAT: Lips are without lesions. Oral mucosa is moist and without lesions. Tongue is normal in shape, size,  and color and without lesions NECK: supple, trachea midline, no neck masses, no thyroid tenderness, no thyromegaly LYMPHATICS: no LAN in the neck, no supraclavicular LAN RESPIRATORY: breathing is even & unlabored, BS CTAB CARDIAC: RRR, no murmur,no extra heart sounds, no edema GI: abdomen soft, normal BS, no masses, no tenderness, no hepatomegaly, no splenomegaly EXTREMITIES:  Able to move 4 extremities PSYCHIATRIC: Alert and oriented X 3. Affect and behavior are appropriate  LABS/RADIOLOGY: Labs reviewed: Basic Metabolic Panel:  Recent Labs  01/18/15 0858 04/12/15 0856 07/29/15 0827  09/12/15 0500 09/13/15 0440  09/14/15 0015 09/14/15 0547 09/14/15 1225  NA 135* 135* 123*  < > 124* 119*  < > 130* 131* 130*  K 5.5* 5.3* 5.6*  < > 3.9 3.7  --   --  4.0  --   CL  --   --   --   < > 93* 87*  --   --  100*  --   CO2 '25 24 23  '$ < > 24 23  --   --  25  --   GLUCOSE 105 88 96  < > 119* 119*  --   --  124*  --   BUN 12.5 21.8 13.5  < > 19 20  --   --  26*  --   CREATININE 0.8 1.0 0.8  < > 0.53* 0.52*  --   --  0.74  --   CALCIUM 9.3 9.0 9.5  < > 8.2* 8.2*  --   --  8.6*  --   MG 2.1 2.3 2.0  --   --   --   --   --   --   --   < > = values in this interval not displayed. Liver Function Tests:  Recent Labs  04/12/15 0856 07/29/15 0827 09/06/15 0817  AST 22 15 37  ALT 36 18 26  ALKPHOS 70 80 87  BILITOT 0.45 0.78 1.1  PROT 6.3* 6.6 6.7  ALBUMIN 4.0 4.2 4.3   CBC:  Recent Labs  04/12/15 0856 07/29/15 0827 08/23/15 0815 09/06/15 0817  WBC 6.1 5.2 4.8 6.5  NEUTROABS 3.9 3.8  --  5.3  HGB 12.8* 12.7* 12.0* 12.6*  HCT 36.9* 35.8* 32.9* 33.9*  MCV 93.7 89.4 86.1 83.3  PLT 305 314 317 275   CBG:  Recent Labs  08/16/15 0705 09/06/15 0753  GLUCAP 100* 111*    Dg Chest 1 View  09/06/2015  CLINICAL DATA:  64 year old male with new altered mental status. Lung cancer with recently diagnosed metastatic disease to the brain. Subsequent encounter. EXAM: CHEST 1 VIEW  COMPARISON:  PET-CT 08/16/2015, and earlier. FINDINGS: Right chest porta cath now in place. Stable cardiac size and mediastinal contours. Stable lung volumes. No pneumothorax, pulmonary edema, pleural effusion. Continued streaky retrocardiac opacity. Increased interstitial markings in both lungs may reflect vascular congestion, no overt edema. IMPRESSION: Stable from recent PET-CT.  No new cardiopulmonary abnormality. Electronically Signed   By: [name] M.D.   On: 09/06/2015 08:30   Ct Head Wo Contrast  09/06/2015  CLINICAL DATA:  Altered mental status, history of small cell lung carcinoma with intracranial metastatic disease EXAM: CT HEAD WITHOUT CONTRAST TECHNIQUE: Contiguous axial images were obtained from the base of the skull through the vertex without intravenous contrast. COMPARISON:  08/25/2015 FINDINGS: Bony calvarium is intact. No gross soft tissue abnormality is noted. Areas a scatter white matter ischemic change are seen. The known metastatic lesions are not well appreciated on this exam. No significant white matter edema is noted. Motion artifact somewhat degrades the study. No acute hemorrhage is seen. IMPRESSION: No acute abnormality noted.  Mild chronic ischemic changes are seen. Electronically Signed   By: [name] M.D.   On: 09/06/2015 08:56   Mr Jeri Cos UJ Contrast  08/25/2015  CLINICAL DATA:  Small cell lung cancer.  Brain metastases. EXAM: MRI HEAD WITHOUT AND WITH CONTRAST TECHNIQUE: Multiplanar, multiecho pulse sequences of the brain and surrounding structures were obtained without and with intravenous contrast. CONTRAST:  74m MULTIHANCE GADOBENATE DIMEGLUMINE  529 MG/ML IV SOLN COMPARISON:  MRI brain 08/09/2015 and 02/03/2015. FINDINGS: Enhancing lesion along the inferior aspect of the left cerebellum measures 1.1 x 1.0 x 0.9 cm. There is a lesion in the inferior left frontal lobe on image 105 of series 10 just above the sylvian fissure which measures 2.4 mm. A 4 x 3 mm lesion is  present in the right parietal lobe on image 118. A focal area of subcortical T2 shine through is present in the left frontal lobe. There is no restricted diffusion. Extensive periventricular and subcortical T2 changes are present bilaterally. There are remote lacunar infarcts in the left thalamus. More mild white matter changes extend into the brainstem. Flow is present in the major intracranial arteries. The globes and orbits are intact. The paranasal sinuses are clear. There is some fluid in the inferior mastoid air cells. Height there is no increased T1 signal within the clivus. Question previous radiation treatment. Previously seen metastatic disease the clivus is no longer present. Advanced degenerative changes are again noted in the upper cervical spine. IMPRESSION: 1. Enhancing metastatic lesion confirmed along the inferior aspect of the left cerebellum. 2. At least 1 2 mm lesion is present in the posterior left frontal lobe. 3. A 4 x 3 mm lesion is present in the right parietal lobe. 4. Diffuse atrophy and white matter disease otherwise likely reflects the sequela of chronic microvascular ischemia. 5. Stable advanced degenerative changes within the upper cervical spine. Electronically Signed   By: San Morelle M.D.   On: 08/25/2015 19:53   Ir Fluoro Guide Cv Line Right  08/26/2015  ADDENDUM REPORT: 08/26/2015 11:13 ADDENDUM: Correction to MEDICATIONS AND MEDICAL HISTORY: 4 mg Versed, 50 mcg fentanyl Electronically Signed   By: Jerilynn Mages.  Shick M.D.   On: 08/26/2015 11:13  08/26/2015  CLINICAL DATA:  SMALL CELL LUNG CARCINOMA, ACCESS FOR CHEMOTHERAPY EXAM: RIGHT INTERNAL JUGULAR SINGLE LUMEN POWER PORT CATHETER INSERTION Date:  10/27/201610/27/2016 10:44 am Radiologist:  M. Daryll Brod, MD Guidance:  Ultrasound and fluoroscopic FLUOROSCOPY TIME:  30 seconds MEDICATIONS AND MEDICAL HISTORY: 2 g Ancefadministered within 1 hour of the procedure.2 mg Versed, 100 mcg fentanyl ANESTHESIA/SEDATION: 30  minutes CONTRAST:  None. COMPLICATIONS: None immediate PROCEDURE: Informed consent was obtained from the patient following explanation of the procedure, risks, benefits and alternatives. The patient understands, agrees and consents for the procedure. All questions were addressed. A time out was performed. Maximal barrier sterile technique utilized including caps, mask, sterile gowns, sterile gloves, large sterile drape, hand hygiene, and 2% chlorhexidine scrub. Under sterile conditions and local anesthesia, right internal jugular micropuncture venous access was performed. Access was performed with ultrasound. Images were obtained for documentation. A guide wire was inserted followed by a transitional dilator. This allowed insertion of a guide wire and catheter into the IVC. Measurements were obtained from the SVC / RA junction back to the right IJ venotomy site. In the right infraclavicular chest, a subcutaneous pocket was created over the second anterior rib. This was done under sterile conditions and local anesthesia. 1% lidocaine with epinephrine was utilized for this. A 2.5 cm incision was made in the skin. Blunt dissection was performed to create a subcutaneous pocket over the right pectoralis major muscle. The pocket was flushed with saline vigorously. There was adequate hemostasis. The port catheter was assembled and checked for leakage. The port catheter was secured in the pocket with two retention sutures. The tubing was tunneled subcutaneously to the right venotomy site and inserted into the SVC/RA  junction through a valved peel-away sheath. Position was confirmed with fluoroscopy. Images were obtained for documentation. The patient tolerated the procedure well. No immediate complications. Incisions were closed in a two layer fashion with 4 - 0 Vicryl suture. Dermabond was applied to the skin. The port catheter was accessed, blood was aspirated followed by saline and heparin flushes. Needle was removed. A  dry sterile dressing was applied. IMPRESSION: Ultrasound and fluoroscopically guided right internal jugular single lumen power port catheter insertion. Tip in the SVC/RA junction. Catheter ready for use. Electronically Signed: By: Jerilynn Mages.  Shick M.D. On: 08/26/2015 11:08   Ir US Guide Vasc Access Right  08/26/2015  ADDENDUM REPORT: 08/26/2015 11:13 ADDENDUM: Correction to MEDICATIONS AND MEDICAL HISTORY: 4 mg Versed, 50 mcg fentanyl Electronically Signed   By: Jerilynn Mages.  Shick M.D.   On: 08/26/2015 11:13  08/26/2015  CLINICAL DATA:  SMALL CELL LUNG CARCINOMA, ACCESS FOR CHEMOTHERAPY EXAM: RIGHT INTERNAL JUGULAR SINGLE LUMEN POWER PORT CATHETER INSERTION Date:  10/27/201610/27/2016 10:44 am Radiologist:  M. Daryll Brod, MD Guidance:  Ultrasound and fluoroscopic FLUOROSCOPY TIME:  30 seconds MEDICATIONS AND MEDICAL HISTORY: 2 g Ancefadministered within 1 hour of the procedure.2 mg Versed, 100 mcg fentanyl ANESTHESIA/SEDATION: 30 minutes CONTRAST:  None. COMPLICATIONS: None immediate PROCEDURE: Informed consent was obtained from the patient following explanation of the procedure, risks, benefits and alternatives. The patient understands, agrees and consents for the procedure. All questions were addressed. A time out was performed. Maximal barrier sterile technique utilized including caps, mask, sterile gowns, sterile gloves, large sterile drape, hand hygiene, and 2% chlorhexidine scrub. Under sterile conditions and local anesthesia, right internal jugular micropuncture venous access was performed. Access was performed with ultrasound. Images were obtained for documentation. A guide wire was inserted followed by a transitional dilator. This allowed insertion of a guide wire and catheter into the IVC. Measurements were obtained from the SVC / RA junction back to the right IJ venotomy site. In the right infraclavicular chest, a subcutaneous pocket was created over the second anterior rib. This was done under sterile conditions  and local anesthesia. 1% lidocaine with epinephrine was utilized for this. A 2.5 cm incision was made in the skin. Blunt dissection was performed to create a subcutaneous pocket over the right pectoralis major muscle. The pocket was flushed with saline vigorously. There was adequate hemostasis. The port catheter was assembled and checked for leakage. The port catheter was secured in the pocket with two retention sutures. The tubing was tunneled subcutaneously to the right venotomy site and inserted into the SVC/RA junction through a valved peel-away sheath. Position was confirmed with fluoroscopy. Images were obtained for documentation. The patient tolerated the procedure well. No immediate complications. Incisions were closed in a two layer fashion with 4 - 0 Vicryl suture. Dermabond was applied to the skin. The port catheter was accessed, blood was aspirated followed by saline and heparin flushes. Needle was removed. A dry sterile dressing was applied. IMPRESSION: Ultrasound and fluoroscopically guided right internal jugular single lumen power port catheter insertion. Tip in the SVC/RA junction. Catheter ready for use. Electronically Signed: By: Jerilynn Mages.  Shick M.D. On: 08/26/2015 11:08    ASSESSMENT/PLAN:  Physical deconditioning - for rehabilitation  Hyponatremia - sodium 130 - increase sodium chloride 2 g by mouth 3 times a day;; fluid restriction to 1000 mL/day; BMP on 09/20/15  Small cell lung cancer - follow-up with Dr. [name], oncology; plans for chemotherapy outpatient upon discharge home; radiation treatment as planned; palliative consult with [facility]  Cerebral edema - continue Decadron 4 mg 1 tab by mouth 4 times a day  Sarcoidosis/COPD - continue Spiriva  Hypertension - continue Norvasc, atenolol and Edarbi  Depression - continue Wellbutrin  History of tobacco abuse - continue nicotine 14 mg/24 hour 1 patch transdermally daily  Constipation - start MiraLAX 17 g by mouth twice a day 3 days  then daily and senna S2 tabs by mouth twice a day  GERD - continue Prilosec 40 mg by mouth every morning    Goals of care:  Short-term rehabilitation    [facility], NP [facility] [phone]

## 2015-09-17 ENCOUNTER — Telehealth: Payer: Self-pay | Admitting: Medical Oncology

## 2015-09-17 NOTE — Telephone Encounter (Signed)
I left message on wife's mobile and called [facility] that pt does not need to keep appt next week and to keep appts on nov 29-spoke to [place].

## 2015-09-20 ENCOUNTER — Other Ambulatory Visit: Payer: Commercial Managed Care - HMO

## 2015-09-20 ENCOUNTER — Ambulatory Visit: Payer: Commercial Managed Care - HMO | Admitting: Internal Medicine

## 2015-09-20 ENCOUNTER — Telehealth: Payer: Self-pay | Admitting: *Deleted

## 2015-09-20 ENCOUNTER — Ambulatory Visit: Payer: Commercial Managed Care - HMO

## 2015-09-20 ENCOUNTER — Non-Acute Institutional Stay (SKILLED_NURSING_FACILITY): Payer: Commercial Managed Care - HMO | Admitting: Internal Medicine

## 2015-09-20 DIAGNOSIS — C3490 Malignant neoplasm of unspecified part of unspecified bronchus or lung: Secondary | ICD-10-CM

## 2015-09-20 DIAGNOSIS — F329 Major depressive disorder, single episode, unspecified: Secondary | ICD-10-CM

## 2015-09-20 DIAGNOSIS — K219 Gastro-esophageal reflux disease without esophagitis: Secondary | ICD-10-CM | POA: Diagnosis not present

## 2015-09-20 DIAGNOSIS — F32A Depression, unspecified: Secondary | ICD-10-CM

## 2015-09-20 DIAGNOSIS — R5381 Other malaise: Secondary | ICD-10-CM | POA: Diagnosis not present

## 2015-09-20 DIAGNOSIS — J449 Chronic obstructive pulmonary disease, unspecified: Secondary | ICD-10-CM | POA: Diagnosis not present

## 2015-09-20 DIAGNOSIS — I10 Essential (primary) hypertension: Secondary | ICD-10-CM

## 2015-09-20 DIAGNOSIS — E871 Hypo-osmolality and hyponatremia: Secondary | ICD-10-CM

## 2015-09-20 DIAGNOSIS — K5900 Constipation, unspecified: Secondary | ICD-10-CM

## 2015-09-20 NOTE — Telephone Encounter (Signed)
Oncology Nurse Navigator Documentation  Oncology Nurse Navigator Flowsheets 09/20/2015  Navigator Encounter Type Telephone/patient wife called.  She had questions regarding appt on 11/29.  I updated her.  She was thankful for the help.   Patient Visit Type Follow-up  Treatment Phase Treatment  Interventions Coordination of Care  Time Spent with Patient 30

## 2015-09-20 NOTE — Progress Notes (Signed)
Patient ID: Spencer Long, male   DOB: 24-Sep-1951, 64 y.o.   MRN: [id number]     [facility]   PCP: [name], MD  Code Status: DNR  Allergies  Allergen Reactions  . Varenicline     Chantix - caused strange behavior  . Isothiazolinone Chloride Rash  . Oxycodone Rash  . Quaternium-15 Rash    Chief Complaint  Patient presents with  . New Admit To SNF     HPI:  64 y.o. patient is here for short term rehabilitation post hospital admission from 09/06/15-09/15/15 with acute encephalopathy thought to be from vasogenic edema of the brain with his small cell lung cancer with brain metastases and hyponatremia. He was started on salt tablets, demeclocycline and lasix along with fluid restriction. His decadron was continued. He is seen in his room today with his wife present. He denies any concerns. He has been feeling better. He worked with therapy team this am. Per wife, he has been getting closer to his baseline and made improvement with therapy. He has PMH of small cell lung cancer with brain metastasis S/P stereotactic radiation therapy  And is under chemotherapy with next session due next week.  Review of Systems:  Constitutional: Negative for fever, chills, diaphoresis.  HENT: Negative for headache, congestion, nasal discharge, difficulty swallowing.   Eyes: Negative for eye pain, blurred vision, double vision and discharge.  Respiratory: Negative for cough, wheezing.   Cardiovascular: Negative for chest pain, palpitations, leg swelling.  Gastrointestinal: Negative for heartburn, nausea, vomiting, abdominal pain. Had bowel movement yesterday Genitourinary: Negative for dysuria, flank pain.  Musculoskeletal: Negative for back pain, falls. Skin: Negative for itching, rash.  Neurological: Negative for dizziness, tingling, focal weakness Psychiatric/Behavioral: Negative for depression.    Past Medical History  Diagnosis Date  .  Hypertension     under control, has been on med. x 15-20 yrs.  [name] GERD (gastroesophageal reflux disease)   . Infection of elbow 2011    right-no infection at present , but has skin lesions present  . Sarcoidosis ([place])     "tends to have skin flareups" and presently experiencing now"feet, legs and arms"  . Chronic hyponatremia     Dr. Buddy Duty follows  . Radiation 02/11/15-02/25/15    brain 25 Gy  . DNR no code (do not resuscitate) 08/02/2015  . Lung cancer [facility])    Past Surgical History  Procedure Laterality Date  . Cholecystectomy  05/2006  . Ulnar nerve repair  01/2008    right; decompression  . Carpal tunnel release  01/2008    right  . Eye surgery  12/2008    correction ectropion, release band, wedge exc., shortening  . I&d extremity  09/12/2011    Procedure: IRRIGATION AND DEBRIDEMENT EXTREMITY;  Surgeon: Tennis Must;  Location: [place];  Service: Orthopedics;  Laterality: Right;  I&d RIGHT OLECRANON BURSSA   . Tonsillectomy    . Endobronchial ultrasound Bilateral 08/24/2014    Procedure: ENDOBRONCHIAL ULTRASOUND;  Surgeon: Collene Gobble, MD;  Location: [facility];  Service: Cardiopulmonary;  Laterality: Bilateral;   Social History:   reports that he has quit smoking. His smoking use included Cigarettes. He has a 60 pack-year smoking history. He quit smokeless tobacco use about 12 months ago. He reports that he drinks about 8.4 oz of alcohol per week. He reports that he does not use illicit drugs.  Family History  Problem Relation Age of Onset  . Cancer Father  Leukemia  . Cancer Mother     breast  . Dementia Mother     Medications:   Medication List       This list is accurate as of: 09/20/15  9:45 AM.  Always use your most recent med list.               acetaminophen 325 MG tablet  Commonly known as:  TYLENOL  Take 2 tablets (650 mg total) by mouth every 6 (six) hours as needed for mild pain (or Fever >/= 101).     Albuterol Sulfate 108  (90 BASE) MCG/ACT Aepb  Commonly known as:  PROAIR RESPICLICK  Inhale 2 puffs into the lungs every 4 (four) hours as needed.     amLODipine 10 MG tablet  Commonly known as:  NORVASC  Take 10 mg by mouth every morning.     aspirin EC 81 MG tablet  Take 81 mg by mouth every morning.     atenolol 25 MG tablet  Commonly known as:  TENORMIN  Take 25 mg by mouth 2 (two) times daily.     buPROPion 150 MG 12 hr tablet  Commonly known as:  WELLBUTRIN SR  Take 1 tablet (150 mg total) by mouth 2 (two) times daily.     dexamethasone 4 MG tablet  Commonly known as:  DECADRON  Take 1 tablet (4 mg total) by mouth 4 (four) times daily.     dicyclomine 20 MG tablet  Commonly known as:  BENTYL  Take 20 mg by mouth every 4 (four) hours as needed for spasms.     feeding supplement (ENSURE ENLIVE) Liqd  Take 237 mLs by mouth 2 (two) times daily between meals.     folic acid 1 MG tablet  Commonly known as:  FOLVITE  Take 1 mg by mouth 2 (two) times daily.     furosemide 20 MG tablet  Commonly known as:  LASIX  Take 1 tablet (20 mg total) by mouth daily.     lidocaine-prilocaine cream  Commonly known as:  EMLA  Apply 1 application topically as needed.     losartan 100 MG tablet  Commonly known as:  COZAAR  Take 100 mg by mouth daily.     MULTIVITAMIN ADULTS 50+ PO  Take 1 tablet by mouth every morning.     omeprazole 40 MG capsule  Commonly known as:  PRILOSEC  Take 40 mg by mouth every morning.     polyethylene glycol packet  Commonly known as:  MIRALAX / GLYCOLAX  Take 17 g by mouth daily.     PROBIOTIC DAILY PO  Take 1 tablet by mouth every morning.     prochlorperazine 10 MG tablet  Commonly known as:  COMPAZINE  Take 1 tablet (10 mg total) by mouth every 6 (six) hours as needed for nausea or vomiting.     sennosides-docusate sodium 8.6-50 MG tablet  Commonly known as:  SENOKOT-S  Take 2 tablets by mouth 2 (two) times daily.     sodium chloride 1 G tablet  Take 2  tablets (2 g total) by mouth 3 (three) times daily with meals.     SPIRIVA RESPIMAT 1.25 MCG/ACT Aers  Generic drug:  Tiotropium Bromide Monohydrate  Inhale 2 puffs into the lungs daily.     thiamine 100 MG tablet  Take 1 tablet (100 mg total) by mouth daily.     white petrolatum Gel  Commonly known as:  VASELINE  Apply 1 application  topically daily as needed for lip care or dry skin.         Physical Exam: Filed Vitals:   09/20/15 0935  BP: 130/66  Pulse: 75  Temp: 98.3 F (36.8 C)  Resp: 17  Weight: 151 lb 12.8 oz (68.856 kg)  SpO2: 98%    General- elderly male, well built, in no acute distress Head- normocephalic, atraumatic, moist mucus membrane Nose- normal nasal mucosa, no maxillary or frontal sinus tenderness           Eyes- PERRLA, EOMI, no pallor, no icterus, no discharge, normal conjunctiva, normal sclera Neck- no cervical lymphadenopathy Chest- porta cath in place, no chest wall tenderness Cardiovascular- normal s1,s2, no murmurs, palpable dorsalis pedis and radial pulses, no leg edema Respiratory- bilateral clear to auscultation, no wheeze, no rhonchi, no crackles, no use of accessory muscles Abdomen- bowel sounds present, soft, non tender Musculoskeletal- able to move all 4 extremities, generalized weakness with unsteady gait Neurological- no focal deficit, alert and oriented to person, place and time Skin- warm and dry Psychiatry- normal mood and affect    Labs reviewed: Basic Metabolic Panel:  Recent Labs  01/18/15 0858 04/12/15 0856 07/29/15 0827  09/12/15 0500 09/13/15 0440  09/14/15 0015 09/14/15 0547 09/14/15 1225  NA 135* 135* 123*  < > 124* 119*  < > 130* 131* 130*  K 5.5* 5.3* 5.6*  < > 3.9 3.7  --   --  4.0  --   CL  --   --   --   < > 93* 87*  --   --  100*  --   CO2 '25 24 23  '$ < > 24 23  --   --  25  --   GLUCOSE 105 88 96  < > 119* 119*  --   --  124*  --   BUN 12.5 21.8 13.5  < > 19 20  --   --  26*  --   CREATININE 0.8 1.0 0.8   < > 0.53* 0.52*  --   --  0.74  --   CALCIUM 9.3 9.0 9.5  < > 8.2* 8.2*  --   --  8.6*  --   MG 2.1 2.3 2.0  --   --   --   --   --   --   --   < > = values in this interval not displayed. Liver Function Tests:  Recent Labs  04/12/15 0856 07/29/15 0827 09/06/15 0817  AST 22 15 37  ALT 36 18 26  ALKPHOS 70 80 87  BILITOT 0.45 0.78 1.1  PROT 6.3* 6.6 6.7  ALBUMIN 4.0 4.2 4.3   No results for input(s): LIPASE, AMYLASE in the last 8760 hours. No results for input(s): AMMONIA in the last 8760 hours. CBC:  Recent Labs  04/12/15 0856 07/29/15 0827 08/23/15 0815 09/06/15 0817  WBC 6.1 5.2 4.8 6.5  NEUTROABS 3.9 3.8  --  5.3  HGB 12.8* 12.7* 12.0* 12.6*  HCT 36.9* 35.8* 32.9* 33.9*  MCV 93.7 89.4 86.1 83.3  PLT 305 314 317 275   CBG:  Recent Labs  08/16/15 0705 09/06/15 0753  GLUCAP 100* 111*     Assessment/Plan  Physical deconditioning Will have him work with physical therapy and occupational therapy team to help with gait training and muscle strengthening exercises.fall precautions. Skin care. Encourage to be out of bed.   Small cell lung cancer with brain mets Is due for chemotherapy next wekk, to  follow with dr [name]. Patient and wife do not want to pursue with palliative care at present. To notify the team. Continue decadron 4 mg qid for now  Hyponatremia  Alert and oriented. Continue sodium chloride 2 g tid for now and restrict free water to 1l a day. Pending bmp from this am  HTN Continue current regimen of atenolol 25 mg bid, norvasc 10 mg daily and losartan 100 mg daily, monitor bp  COPD Continue spiriva and prn albuterol for now  Constipation Stable, continue miralax daily with senna s 2 tab bid  Gerd stable, continue prilosec 40 mg daily  Depression continue Wellbutrin 150 mg bid and monitor   Goals of care: short term rehabilitation   Labs/tests ordered: cbc and bmp pending  Family/ staff Communication: reviewed care plan with patient  and nursing supervisor    Blanchie Serve, MD  [facility] [phone] (Monday-Friday 8 am - 5 pm) [phone] (afterhours)

## 2015-09-20 NOTE — Telephone Encounter (Signed)
Patient's wife called, I called her back and left a vm message to call with my name and phone number

## 2015-09-21 ENCOUNTER — Ambulatory Visit: Payer: Commercial Managed Care - HMO

## 2015-09-21 ENCOUNTER — Non-Acute Institutional Stay (SKILLED_NURSING_FACILITY): Payer: Commercial Managed Care - HMO | Admitting: Adult Health

## 2015-09-21 ENCOUNTER — Other Ambulatory Visit: Payer: Commercial Managed Care - HMO

## 2015-09-21 DIAGNOSIS — D869 Sarcoidosis, unspecified: Secondary | ICD-10-CM

## 2015-09-21 DIAGNOSIS — I10 Essential (primary) hypertension: Secondary | ICD-10-CM

## 2015-09-21 DIAGNOSIS — F329 Major depressive disorder, single episode, unspecified: Secondary | ICD-10-CM

## 2015-09-21 DIAGNOSIS — K5900 Constipation, unspecified: Secondary | ICD-10-CM

## 2015-09-21 DIAGNOSIS — C3490 Malignant neoplasm of unspecified part of unspecified bronchus or lung: Secondary | ICD-10-CM

## 2015-09-21 DIAGNOSIS — E871 Hypo-osmolality and hyponatremia: Secondary | ICD-10-CM

## 2015-09-21 DIAGNOSIS — F32A Depression, unspecified: Secondary | ICD-10-CM

## 2015-09-21 DIAGNOSIS — G936 Cerebral edema: Secondary | ICD-10-CM | POA: Diagnosis not present

## 2015-09-21 DIAGNOSIS — K219 Gastro-esophageal reflux disease without esophagitis: Secondary | ICD-10-CM

## 2015-09-21 DIAGNOSIS — J449 Chronic obstructive pulmonary disease, unspecified: Secondary | ICD-10-CM | POA: Diagnosis not present

## 2015-09-21 DIAGNOSIS — Z87891 Personal history of nicotine dependence: Secondary | ICD-10-CM | POA: Diagnosis not present

## 2015-09-21 DIAGNOSIS — R5381 Other malaise: Secondary | ICD-10-CM | POA: Diagnosis not present

## 2015-09-22 ENCOUNTER — Ambulatory Visit: Payer: Commercial Managed Care - HMO

## 2015-09-22 NOTE — Progress Notes (Signed)
  Radiation Oncology         (336) [phone] ________________________________  Name: CHEYENNE SCHUMM MRN: [id number]  Date: 09/03/2015  DOB: [birth year]  End of Treatment Note  Diagnosis:   Metastatic small cell lung cancer with brain metastasis     Indication for treatment:  palliative       Radiation treatment dates:   09/03/2015  Site/dose:    1.  PTV1 Lt Cerebellum 14m target was treated using 4 Arcs to a prescription dose of 20 Gy. ExacTrac Snap verification was performed for each couch angle.  2.  PTV2 Rt Parietal 417mtarget was treated using 2 Arcs to a prescription dose of 20 Gy. ExacTrac Snap verification was performed for each couch angle. 3.  PTV3 Inf Lt Frontal 58m20marget was treated using 2 Arcs to a prescription dose of 20 Gy. ExacTrac Snap verification was performed for each couch angle.  Narrative: The patient tolerated radiation treatment well.   There were no signs of acute toxicity after treatment.  Plan: The patient has completed radiation treatment. The patient will return to radiation oncology clinic for routine followup in one month. I advised the patient to call or return sooner if they have any questions or concerns related to their recovery or treatment. ________________________________  ------------------------------------------------  JohJodelle GrossD, PhD

## 2015-09-22 NOTE — Progress Notes (Signed)
  Radiation Oncology         [phone] ________________________________  Name: Spencer Long MRN: [id number]  Date: 09/03/2015  DOB: 10/21/1951   SPECIAL TREATMENT PROCEDURE   3D TREATMENT PLANNING AND DOSIMETRY: The patient's radiation plan was reviewed and approved by Dr. Ronnald Ramp from neurosurgery and radiation oncology prior to treatment. It showed 3-dimensional radiation distributions overlaid onto the planning CT/MRI image set. The [facility] for the target structures as well as the organs at risk were reviewed. The documentation of the 3D plan and dosimetry are filed in the radiation oncology EMR.   NARRATIVE: The patient was brought to the TrueBeam stereotactic radiation treatment machine and placed supine on the CT couch. The head frame was applied, and the patient was set up for stereotactic radiosurgery. Neurosurgery was present for the set-up and delivery   SIMULATION VERIFICATION: In the couch zero-angle position, the patient underwent Exactrac imaging using the Brainlab system with orthogonal KV images. These were carefully aligned and repeated to confirm treatment position for each of the isocenters. The Exactrac snap film verification was repeated at each couch angle.   SPECIAL TREATMENT PROCEDURE: The patient received stereotactic radiosurgery to the following targets:  1.  PTV1 Lt Cerebellum 36m target was treated using 4 Arcs to a prescription dose of 20 Gy. ExacTrac Snap verification was performed for each couch angle.  2.  PTV2 Rt Parietal 425mtarget was treated using 2 Arcs to a prescription dose of 20 Gy. ExacTrac Snap verification was performed for each couch angle. 3.  PTV3 Inf Lt Frontal 15m59marget was treated using 2 Arcs to a prescription dose of 20 Gy. ExacTrac Snap verification was performed for each couch angle.  STEREOTACTIC TREATMENT MANAGEMENT: Following delivery, the patient was transported to nursing in stable condition and monitored for possible acute effects.  Vital signs were recorded . The patient tolerated treatment without significant acute effects, and was discharged to home in stable condition.  PLAN: Follow-up in one month.   ------------------------------------------------  JohJodelle GrossD, PhD

## 2015-09-22 NOTE — Addendum Note (Signed)
Encounter addended by: Kyung Rudd, MD on: 09/22/2015  4:53 PM<BR>     Documentation filed: Notes Section, Problem List, Visit Diagnoses

## 2015-09-22 NOTE — Progress Notes (Signed)
  Radiation Oncology         (336) [phone] ________________________________  Name: Spencer Long MRN: [id number]  Date: 08/27/2015  DOB: 07-Oct-1951  SIMULATION AND TREATMENT PLANNING NOTE  DIAGNOSIS:  Metastatic small cell lung cancer  NARRATIVE:  The patient was brought to the [facility] suite.  Identity was confirmed.  All relevant records and images related to the planned course of therapy were reviewed.  The patient freely provided informed written consent to proceed with treatment after reviewing the details related to the planned course of therapy. The consent form was witnessed and verified by the simulation staff. Intravenous access was established for contrast administration. Then, the patient was set-up in a stable reproducible supine position for radiation therapy.  A relocatable thermoplastic stereotactic head frame was fabricated for precise immobilization.  CT images were obtained.  Surface markings were placed.  The CT images were loaded into the planning software and fused with the patient's targeting MRI scan.  Then the target and avoidance structures were contoured.  Treatment planning then occurred.  The radiation prescription was entered and confirmed.  I have requested 3D planning  I have requested a DVH of the following structures: Brain stem, brain, left eye, right I, lenses, optic chiasm, target volumes, uninvolved brain, and normal tissue.    PLAN:  The patient will receive 20 Gy in 1 fraction to 3 intracranial lesions.   Special treatment procedure The patient will be treated with a course of radiosurgery. This requires extremely precise localization of the target and sophisticated, labor intensive treatment planning to achieve a highly focused radiation treatment plan. Due to the extra work involved in planning and delivery of such a procedure, this corresponds to a special treatment procedure.  ________________________________  Jodelle Gross, MD, PhD

## 2015-09-22 NOTE — Addendum Note (Signed)
Encounter addended by: Kyung Rudd, MD on: 09/22/2015  4:57 PM<BR>     Documentation filed: Notes Section, Visit Diagnoses

## 2015-09-24 ENCOUNTER — Encounter: Payer: Self-pay | Admitting: Adult Health

## 2015-09-24 NOTE — Progress Notes (Signed)
Patient ID: Spencer Long, male   DOB: October 18, 1951, 64 y.o.   MRN: [id number]    DATE:  09/21/15  MRN:  [id number]  BIRTHDAY: 11-16-1950  Facility:  Nursing Home Location:  [place] Room Number: 341-9  LEVEL OF CARE:  SNF [phone])  Contact Information    Name Relation Home Work [place] Spouse [phone]      Chief Complaint  Patient presents with  . Discharge Note    Physical deconditioning, hyponatremia, small cell lung cancer, sarcoidosis, COPD, hypertension, depression, tobacco abuse, constipation, GERD and cerebral edema    HISTORY OF PRESENT ILLNESS:  This is a 64 year old male who is for discharge home with Home health PT and OT. DME:  Standard rolling walker and bedside commode. He has been admitted to [facility] on 09/15/15 from [facility]. He has PMH of small cell lung cancer with brain metastasis S/P stereotactic radiation therapy under the care of Dr. [name] and chronic hyponatremia on salt tablets. He was brought to Hospital via EMS secondary to altered mental status. He was found to have sodium 119. CT of the head was stable compared to prior studies. He was treated with salt tablets and Lasix and fluid restriction. Discharge sodium level is 130. CT of the head stable. Likely has underlying vasogenic edema.  Patient was admitted to this facility for short-term rehabilitation after the patient's recent hospitalization.  Patient has completed SNF rehabilitation and therapy has cleared the patient for discharge.  PAST MEDICAL HISTORY:  Past Medical History  Diagnosis Date  . Hypertension     under control, has been on med. x 15-20 yrs.  [name] GERD (gastroesophageal reflux disease)   . Infection of elbow 2011    right-no infection at present , but has skin lesions present  . Sarcoidosis ([place])     "tends to have skin flareups" and presently experiencing now"feet, legs and arms"  .  Chronic hyponatremia     Dr. Buddy Duty follows  . Radiation 02/11/15-02/25/15    brain 25 Gy  . DNR no code (do not resuscitate) 08/02/2015  . Lung cancer [facility])      CURRENT MEDICATIONS: Reviewed  Patient's Medications  New Prescriptions   No medications on file  Previous Medications   ACETAMINOPHEN (TYLENOL) 325 MG TABLET    Take 2 tablets (650 mg total) by mouth every 6 (six) hours as needed for mild pain (or Fever >/= 101).   ALBUTEROL SULFATE (PROAIR RESPICLICK) 026 (90 BASE) MCG/ACT AEPB    Inhale 2 puffs into the lungs every 4 (four) hours as needed.   AMLODIPINE (NORVASC) 10 MG TABLET    Take 10 mg by mouth every morning.    ASPIRIN EC 81 MG TABLET    Take 81 mg by mouth every morning.    ATENOLOL (TENORMIN) 25 MG TABLET    Take 25 mg by mouth 2 (two) times daily.   BUPROPION (WELLBUTRIN SR) 150 MG 12 HR TABLET    Take 1 tablet (150 mg total) by mouth 2 (two) times daily.   DEXAMETHASONE (DECADRON) 4 MG TABLET    Take 1 tablet (4 mg total) by mouth 4 (four) times daily.   DICYCLOMINE (BENTYL) 20 MG TABLET    Take 20 mg by mouth every 4 (four) hours as needed for spasms.    FEEDING SUPPLEMENT, ENSURE ENLIVE, (ENSURE ENLIVE) LIQD    Take 237 mLs by mouth 2 (two) times daily  between meals.   FOLIC ACID (FOLVITE) 1 MG TABLET    Take 1 mg by mouth 2 (two) times daily.   FUROSEMIDE (LASIX) 20 MG TABLET    Take 1 tablet (20 mg total) by mouth daily.   LIDOCAINE-PRILOCAINE (EMLA) CREAM    Apply 1 application topically as needed.   LOSARTAN (COZAAR) 100 MG TABLET    Take 100 mg by mouth daily.   MULTIPLE VITAMINS-MINERALS (MULTIVITAMIN ADULTS 50+ PO)    Take 1 tablet by mouth every morning.    OMEPRAZOLE (PRILOSEC) 40 MG CAPSULE    Take 40 mg by mouth every morning.   POLYETHYLENE GLYCOL (MIRALAX / GLYCOLAX) PACKET    Take 17 g by mouth daily.   PROBIOTIC PRODUCT (PROBIOTIC DAILY PO)    Take 1 tablet by mouth every morning.    PROCHLORPERAZINE (COMPAZINE) 10 MG TABLET    Take 1 tablet (10 mg  total) by mouth every 6 (six) hours as needed for nausea or vomiting.   SENNOSIDES-DOCUSATE SODIUM (SENOKOT-S) 8.6-50 MG TABLET    Take 2 tablets by mouth 2 (two) times daily.   SODIUM CHLORIDE 1 G TABLET    Take 2 tablets (2 g total) by mouth 3 (three) times daily with meals.   THIAMINE 100 MG TABLET    Take 1 tablet (100 mg total) by mouth daily.   TIOTROPIUM BROMIDE MONOHYDRATE (SPIRIVA RESPIMAT) 1.25 MCG/ACT AERS    Inhale 2 puffs into the lungs daily.   WHITE PETROLATUM (VASELINE) GEL    Apply 1 application topically daily as needed for lip care or dry skin.  Modified Medications   No medications on file  Discontinued Medications   No medications on file     Allergies  Allergen Reactions  . Varenicline     Chantix - caused strange behavior  . Isothiazolinone Chloride Rash  . Oxycodone Rash  . Quaternium-15 Rash     REVIEW OF SYSTEMS:  GENERAL: no change in appetite, no fatigue, no weight changes, no fever, chills or weakness EYES: Denies change in vision, dry eyes, eye pain, itching or discharge EARS: Denies change in hearing, ringing in ears, or earache NOSE: Denies nasal congestion or epistaxis MOUTH and THROAT: Denies oral discomfort, gingival pain or bleeding, pain from teeth or hoarseness   RESPIRATORY: no cough, SOB, DOE, wheezing, hemoptysis CARDIAC: no chest pain, edema or palpitations GI: no abdominal pain, diarrhea, constipation, heart burn, nausea or vomiting GU: Denies dysuria, frequency, hematuria, incontinence, or discharge PSYCHIATRIC: Denies feeling of depression or anxiety. No report of hallucinations, insomnia, paranoia, or agitation   PHYSICAL EXAMINATION  GENERAL APPEARANCE: Well nourished. In no acute distress. Normal body habitus SKIN:  Skin is warm and dry.  HEAD: Normal in size and contour. No evidence of trauma EYES: Lids open and close normally. No blepharitis, entropion or ectropion. PERRL. Conjunctivae are clear and sclerae are white. Lenses  are without opacity EARS: Pinnae are normal. Patient hears normal voice tunes of the examiner MOUTH and THROAT: Lips are without lesions. Oral mucosa is moist and without lesions. Tongue is normal in shape, size, and color and without lesions NECK: supple, trachea midline, no neck masses, no thyroid tenderness, no thyromegaly LYMPHATICS: no LAN in the neck, no supraclavicular LAN RESPIRATORY: breathing is even & unlabored, BS CTAB CARDIAC: RRR, no murmur,no extra heart sounds, no edema, has porta cath on chest GI: abdomen soft, normal BS, no masses, no tenderness, no hepatomegaly, no splenomegaly EXTREMITIES:  Able to move 4 extremities  PSYCHIATRIC: Alert and oriented X 3. Affect and behavior are appropriate  LABS/RADIOLOGY: Labs reviewed: 09/20/15  sodium 133 potassium 4.1 glucose 112 BUN 31 creatinine 0.65 calcium 8.3 Basic Metabolic Panel:  Recent Labs  01/18/15 0858 04/12/15 0856 07/29/15 0827  09/12/15 0500 09/13/15 0440  09/14/15 0015 09/14/15 0547 09/14/15 1225  NA 135* 135* 123*  < > 124* 119*  < > 130* 131* 130*  K 5.5* 5.3* 5.6*  < > 3.9 3.7  --   --  4.0  --   CL  --   --   --   < > 93* 87*  --   --  100*  --   CO2 '25 24 23  '$ < > 24 23  --   --  25  --   GLUCOSE 105 88 96  < > 119* 119*  --   --  124*  --   BUN 12.5 21.8 13.5  < > 19 20  --   --  26*  --   CREATININE 0.8 1.0 0.8  < > 0.53* 0.52*  --   --  0.74  --   CALCIUM 9.3 9.0 9.5  < > 8.2* 8.2*  --   --  8.6*  --   MG 2.1 2.3 2.0  --   --   --   --   --   --   --   < > = values in this interval not displayed. Liver Function Tests:  Recent Labs  04/12/15 0856 07/29/15 0827 09/06/15 0817  AST 22 15 37  ALT 36 18 26  ALKPHOS 70 80 87  BILITOT 0.45 0.78 1.1  PROT 6.3* 6.6 6.7  ALBUMIN 4.0 4.2 4.3   CBC:  Recent Labs  04/12/15 0856 07/29/15 0827 08/23/15 0815 09/06/15 0817  WBC 6.1 5.2 4.8 6.5  NEUTROABS 3.9 3.8  --  5.3  HGB 12.8* 12.7* 12.0* 12.6*  HCT 36.9* 35.8* 32.9* 33.9*  MCV 93.7 89.4  86.1 83.3  PLT 305 314 317 275   CBG:  Recent Labs  08/16/15 0705 09/06/15 0753  GLUCAP 100* 111*    Dg Chest 1 View  09/06/2015  CLINICAL DATA:  64 year old male with new altered mental status. Lung cancer with recently diagnosed metastatic disease to the brain. Subsequent encounter. EXAM: CHEST 1 VIEW COMPARISON:  PET-CT 08/16/2015, and earlier. FINDINGS: Right chest porta cath now in place. Stable cardiac size and mediastinal contours. Stable lung volumes. No pneumothorax, pulmonary edema, pleural effusion. Continued streaky retrocardiac opacity. Increased interstitial markings in both lungs may reflect vascular congestion, no overt edema. IMPRESSION: Stable from recent PET-CT.  No new cardiopulmonary abnormality. Electronically Signed   By: [name] M.D.   On: 09/06/2015 08:30   Ct Head Wo Contrast  09/06/2015  CLINICAL DATA:  Altered mental status, history of small cell lung carcinoma with intracranial metastatic disease EXAM: CT HEAD WITHOUT CONTRAST TECHNIQUE: Contiguous axial images were obtained from the base of the skull through the vertex without intravenous contrast. COMPARISON:  08/25/2015 FINDINGS: Bony calvarium is intact. No gross soft tissue abnormality is noted. Areas a scatter white matter ischemic change are seen. The known metastatic lesions are not well appreciated on this exam. No significant white matter edema is noted. Motion artifact somewhat degrades the study. No acute hemorrhage is seen. IMPRESSION: No acute abnormality noted.  Mild chronic ischemic changes are seen. Electronically Signed   By: [name] M.D.   On: 09/06/2015 08:56   Mr  Brain W Wo Contrast  08/25/2015  CLINICAL DATA:  Small cell lung cancer.  Brain metastases. EXAM: MRI HEAD WITHOUT AND WITH CONTRAST TECHNIQUE: Multiplanar, multiecho pulse sequences of the brain and surrounding structures were obtained without and with intravenous contrast. CONTRAST:  54m MULTIHANCE GADOBENATE DIMEGLUMINE 529 MG/ML  IV SOLN COMPARISON:  MRI brain 08/09/2015 and 02/03/2015. FINDINGS: Enhancing lesion along the inferior aspect of the left cerebellum measures 1.1 x 1.0 x 0.9 cm. There is a lesion in the inferior left frontal lobe on image 105 of series 10 just above the sylvian fissure which measures 2.4 mm. A 4 x 3 mm lesion is present in the right parietal lobe on image 118. A focal area of subcortical T2 shine through is present in the left frontal lobe. There is no restricted diffusion. Extensive periventricular and subcortical T2 changes are present bilaterally. There are remote lacunar infarcts in the left thalamus. More mild white matter changes extend into the brainstem. Flow is present in the major intracranial arteries. The globes and orbits are intact. The paranasal sinuses are clear. There is some fluid in the inferior mastoid air cells. Height there is no increased T1 signal within the clivus. Question previous radiation treatment. Previously seen metastatic disease the clivus is no longer present. Advanced degenerative changes are again noted in the upper cervical spine. IMPRESSION: 1. Enhancing metastatic lesion confirmed along the inferior aspect of the left cerebellum. 2. At least 1 2 mm lesion is present in the posterior left frontal lobe. 3. A 4 x 3 mm lesion is present in the right parietal lobe. 4. Diffuse atrophy and white matter disease otherwise likely reflects the sequela of chronic microvascular ischemia. 5. Stable advanced degenerative changes within the upper cervical spine. Electronically Signed   By: CSan MorelleM.D.   On: 08/25/2015 19:53   Ir Fluoro Guide Cv Line Right  08/26/2015  ADDENDUM REPORT: 08/26/2015 11:13 ADDENDUM: Correction to MEDICATIONS AND MEDICAL HISTORY: 4 mg Versed, 50 mcg fentanyl Electronically Signed   By: MJerilynn Mages  Shick M.D.   On: 08/26/2015 11:13  08/26/2015  CLINICAL DATA:  SMALL CELL LUNG CARCINOMA, ACCESS FOR CHEMOTHERAPY EXAM: RIGHT INTERNAL JUGULAR SINGLE LUMEN  POWER PORT CATHETER INSERTION Date:  10/27/201610/27/2016 10:44 am Radiologist:  M. TDaryll Brod MD Guidance:  Ultrasound and fluoroscopic FLUOROSCOPY TIME:  30 seconds MEDICATIONS AND MEDICAL HISTORY: 2 g Ancefadministered within 1 hour of the procedure.2 mg Versed, 100 mcg fentanyl ANESTHESIA/SEDATION: 30 minutes CONTRAST:  None. COMPLICATIONS: None immediate PROCEDURE: Informed consent was obtained from the patient following explanation of the procedure, risks, benefits and alternatives. The patient understands, agrees and consents for the procedure. All questions were addressed. A time out was performed. Maximal barrier sterile technique utilized including caps, mask, sterile gowns, sterile gloves, large sterile drape, hand hygiene, and 2% chlorhexidine scrub. Under sterile conditions and local anesthesia, right internal jugular micropuncture venous access was performed. Access was performed with ultrasound. Images were obtained for documentation. A guide wire was inserted followed by a transitional dilator. This allowed insertion of a guide wire and catheter into the IVC. Measurements were obtained from the SVC / RA junction back to the right IJ venotomy site. In the right infraclavicular chest, a subcutaneous pocket was created over the second anterior rib. This was done under sterile conditions and local anesthesia. 1% lidocaine with epinephrine was utilized for this. A 2.5 cm incision was made in the skin. Blunt dissection was performed to create a subcutaneous pocket over the right pectoralis major  muscle. The pocket was flushed with saline vigorously. There was adequate hemostasis. The port catheter was assembled and checked for leakage. The port catheter was secured in the pocket with two retention sutures. The tubing was tunneled subcutaneously to the right venotomy site and inserted into the SVC/RA junction through a valved peel-away sheath. Position was confirmed with fluoroscopy. Images were obtained  for documentation. The patient tolerated the procedure well. No immediate complications. Incisions were closed in a two layer fashion with 4 - 0 Vicryl suture. Dermabond was applied to the skin. The port catheter was accessed, blood was aspirated followed by saline and heparin flushes. Needle was removed. A dry sterile dressing was applied. IMPRESSION: Ultrasound and fluoroscopically guided right internal jugular single lumen power port catheter insertion. Tip in the SVC/RA junction. Catheter ready for use. Electronically Signed: By: Jerilynn Mages.  Shick M.D. On: 08/26/2015 11:08   Ir US Guide Vasc Access Right  08/26/2015  ADDENDUM REPORT: 08/26/2015 11:13 ADDENDUM: Correction to MEDICATIONS AND MEDICAL HISTORY: 4 mg Versed, 50 mcg fentanyl Electronically Signed   By: Jerilynn Mages.  Shick M.D.   On: 08/26/2015 11:13  08/26/2015  CLINICAL DATA:  SMALL CELL LUNG CARCINOMA, ACCESS FOR CHEMOTHERAPY EXAM: RIGHT INTERNAL JUGULAR SINGLE LUMEN POWER PORT CATHETER INSERTION Date:  10/27/201610/27/2016 10:44 am Radiologist:  M. Daryll Brod, MD Guidance:  Ultrasound and fluoroscopic FLUOROSCOPY TIME:  30 seconds MEDICATIONS AND MEDICAL HISTORY: 2 g Ancefadministered within 1 hour of the procedure.2 mg Versed, 100 mcg fentanyl ANESTHESIA/SEDATION: 30 minutes CONTRAST:  None. COMPLICATIONS: None immediate PROCEDURE: Informed consent was obtained from the patient following explanation of the procedure, risks, benefits and alternatives. The patient understands, agrees and consents for the procedure. All questions were addressed. A time out was performed. Maximal barrier sterile technique utilized including caps, mask, sterile gowns, sterile gloves, large sterile drape, hand hygiene, and 2% chlorhexidine scrub. Under sterile conditions and local anesthesia, right internal jugular micropuncture venous access was performed. Access was performed with ultrasound. Images were obtained for documentation. A guide wire was inserted followed by a  transitional dilator. This allowed insertion of a guide wire and catheter into the IVC. Measurements were obtained from the SVC / RA junction back to the right IJ venotomy site. In the right infraclavicular chest, a subcutaneous pocket was created over the second anterior rib. This was done under sterile conditions and local anesthesia. 1% lidocaine with epinephrine was utilized for this. A 2.5 cm incision was made in the skin. Blunt dissection was performed to create a subcutaneous pocket over the right pectoralis major muscle. The pocket was flushed with saline vigorously. There was adequate hemostasis. The port catheter was assembled and checked for leakage. The port catheter was secured in the pocket with two retention sutures. The tubing was tunneled subcutaneously to the right venotomy site and inserted into the SVC/RA junction through a valved peel-away sheath. Position was confirmed with fluoroscopy. Images were obtained for documentation. The patient tolerated the procedure well. No immediate complications. Incisions were closed in a two layer fashion with 4 - 0 Vicryl suture. Dermabond was applied to the skin. The port catheter was accessed, blood was aspirated followed by saline and heparin flushes. Needle was removed. A dry sterile dressing was applied. IMPRESSION: Ultrasound and fluoroscopically guided right internal jugular single lumen power port catheter insertion. Tip in the SVC/RA junction. Catheter ready for use. Electronically Signed: By: Jerilynn Mages.  Shick M.D. On: 08/26/2015 11:08    ASSESSMENT/PLAN:  Physical deconditioning - for home health PT and OT  Hyponatremia - re-checked  sodium 133, improved from 130;  continue sodium chloride 2 g by mouth 3 times a day;; fluid restriction to 1000 mL/day  Small cell lung cancer - follow-up with Dr. [name], oncology; plans for chemotherapy outpatient upon discharged home; radiation treatment as planned  Cerebral edema - continue Decadron 4 mg 1 tab  by mouth 4 times a day  Sarcoidosis/COPD - continue Spiriva  Hypertension - continue Norvasc, atenolol and Edarbi  Depression - continue Wellbutrin  History of tobacco abuse - continue nicotine 14 mg/24 hour 1 patch transdermally daily  Constipation - start MiraLAX 17 g by mouth twice a day 3 days then daily and senna S2 tabs by mouth twice a day  GERD - continue Prilosec 40 mg by mouth every morning      I have filled out patient's discharge paperwork and written prescriptions.  Patient will receive home health PT and OT.  DME provided:  Standard rolling walker and bedside commode  Total discharge time: Greater than 30 minutes  Discharge time involved coordination of the discharge process with social worker, nursing staff and therapy department. Medical justification for home health services/DME verified.     [facility], NP [employer] [phone]

## 2015-09-27 ENCOUNTER — Other Ambulatory Visit: Payer: Commercial Managed Care - HMO

## 2015-09-27 DIAGNOSIS — C3490 Malignant neoplasm of unspecified part of unspecified bronchus or lung: Secondary | ICD-10-CM | POA: Diagnosis not present

## 2015-09-27 DIAGNOSIS — D869 Sarcoidosis, unspecified: Secondary | ICD-10-CM | POA: Diagnosis not present

## 2015-09-27 DIAGNOSIS — I10 Essential (primary) hypertension: Secondary | ICD-10-CM | POA: Diagnosis not present

## 2015-09-27 DIAGNOSIS — C7931 Secondary malignant neoplasm of brain: Secondary | ICD-10-CM | POA: Diagnosis not present

## 2015-09-28 ENCOUNTER — Ambulatory Visit (HOSPITAL_BASED_OUTPATIENT_CLINIC_OR_DEPARTMENT_OTHER): Payer: Commercial Managed Care - HMO | Admitting: Internal Medicine

## 2015-09-28 ENCOUNTER — Encounter: Payer: Self-pay | Admitting: Internal Medicine

## 2015-09-28 ENCOUNTER — Telehealth: Payer: Self-pay | Admitting: Internal Medicine

## 2015-09-28 ENCOUNTER — Ambulatory Visit (HOSPITAL_BASED_OUTPATIENT_CLINIC_OR_DEPARTMENT_OTHER): Payer: Commercial Managed Care - HMO

## 2015-09-28 ENCOUNTER — Ambulatory Visit: Payer: Commercial Managed Care - HMO | Admitting: Nutrition

## 2015-09-28 ENCOUNTER — Other Ambulatory Visit (HOSPITAL_BASED_OUTPATIENT_CLINIC_OR_DEPARTMENT_OTHER): Payer: Commercial Managed Care - HMO

## 2015-09-28 VITALS — BP 112/56 | HR 75 | Temp 98.6°F | Resp 18 | Ht 68.0 in | Wt 155.0 lb

## 2015-09-28 DIAGNOSIS — C3492 Malignant neoplasm of unspecified part of left bronchus or lung: Secondary | ICD-10-CM

## 2015-09-28 DIAGNOSIS — Z5111 Encounter for antineoplastic chemotherapy: Secondary | ICD-10-CM

## 2015-09-28 DIAGNOSIS — C3490 Malignant neoplasm of unspecified part of unspecified bronchus or lung: Secondary | ICD-10-CM

## 2015-09-28 DIAGNOSIS — C7931 Secondary malignant neoplasm of brain: Secondary | ICD-10-CM

## 2015-09-28 DIAGNOSIS — E871 Hypo-osmolality and hyponatremia: Secondary | ICD-10-CM

## 2015-09-28 LAB — COMPREHENSIVE METABOLIC PANEL (CC13)
ALT: 27 U/L (ref 0–55)
ANION GAP: 10 meq/L (ref 3–11)
AST: 14 U/L (ref 5–34)
Albumin: 3.3 g/dL — ABNORMAL LOW (ref 3.5–5.0)
Alkaline Phosphatase: 59 U/L (ref 40–150)
BUN: 40.8 mg/dL — AB (ref 7.0–26.0)
CALCIUM: 8.5 mg/dL (ref 8.4–10.4)
CHLORIDE: 103 meq/L (ref 98–109)
CO2: 24 mEq/L (ref 22–29)
Creatinine: 0.8 mg/dL (ref 0.7–1.3)
Glucose: 87 mg/dl (ref 70–140)
POTASSIUM: 4.5 meq/L (ref 3.5–5.1)
Sodium: 137 mEq/L (ref 136–145)
Total Bilirubin: 0.78 mg/dL (ref 0.20–1.20)
Total Protein: 5.7 g/dL — ABNORMAL LOW (ref 6.4–8.3)

## 2015-09-28 LAB — CBC WITH DIFFERENTIAL/PLATELET
BASO%: 0 % (ref 0.0–2.0)
BASOS ABS: 0 10*3/uL (ref 0.0–0.1)
EOS%: 0 % (ref 0.0–7.0)
Eosinophils Absolute: 0 10*3/uL (ref 0.0–0.5)
HEMATOCRIT: 40.3 % (ref 38.4–49.9)
HGB: 13.4 g/dL (ref 13.0–17.1)
LYMPH#: 0.7 10*3/uL — AB (ref 0.9–3.3)
LYMPH%: 4.3 % — ABNORMAL LOW (ref 14.0–49.0)
MCH: 31.1 pg (ref 27.2–33.4)
MCHC: 33.2 g/dL (ref 32.0–36.0)
MCV: 93.5 fL (ref 79.3–98.0)
MONO#: 0.9 10*3/uL (ref 0.1–0.9)
MONO%: 5.4 % (ref 0.0–14.0)
NEUT#: 14.5 10*3/uL — ABNORMAL HIGH (ref 1.5–6.5)
NEUT%: 90.3 % — AB (ref 39.0–75.0)
Platelets: 209 10*3/uL (ref 140–400)
RBC: 4.31 10*6/uL (ref 4.20–5.82)
RDW: 14.6 % (ref 11.0–14.6)
WBC: 16.1 10*3/uL — ABNORMAL HIGH (ref 4.0–10.3)

## 2015-09-28 LAB — MAGNESIUM (CC13): MAGNESIUM: 2.5 mg/dL (ref 1.5–2.5)

## 2015-09-28 LAB — TECHNOLOGIST REVIEW

## 2015-09-28 MED ORDER — SODIUM CHLORIDE 0.9 % IV SOLN
100.0000 mg/m2 | Freq: Once | INTRAVENOUS | Status: AC
  Administered 2015-09-28: 190 mg via INTRAVENOUS
  Filled 2015-09-28: qty 9.5

## 2015-09-28 MED ORDER — HEPARIN SOD (PORK) LOCK FLUSH 100 UNIT/ML IV SOLN
500.0000 [IU] | Freq: Once | INTRAVENOUS | Status: AC | PRN
  Administered 2015-09-28: 500 [IU]
  Filled 2015-09-28: qty 5

## 2015-09-28 MED ORDER — SODIUM CHLORIDE 0.9 % IV SOLN
Freq: Once | INTRAVENOUS | Status: AC
  Administered 2015-09-28: 10:00:00 via INTRAVENOUS

## 2015-09-28 MED ORDER — SODIUM CHLORIDE 0.9 % IV SOLN
Freq: Once | INTRAVENOUS | Status: AC
  Administered 2015-09-28: 11:00:00 via INTRAVENOUS
  Filled 2015-09-28: qty 8

## 2015-09-28 MED ORDER — SODIUM CHLORIDE 0.9 % IJ SOLN
10.0000 mL | INTRAMUSCULAR | Status: DC | PRN
  Administered 2015-09-28: 10 mL
  Filled 2015-09-28: qty 10

## 2015-09-28 MED ORDER — SODIUM CHLORIDE 0.9 % IV SOLN
512.0000 mg | Freq: Once | INTRAVENOUS | Status: AC
  Administered 2015-09-28: 510 mg via INTRAVENOUS
  Filled 2015-09-28: qty 51

## 2015-09-28 NOTE — Progress Notes (Signed)
[place] [phone]) [phone]   Fax:([phone]) [place], [place] [address]  DIAGNOSIS: Extensive Stage (T2, N1, M1b) Small cell lung Cancer diagnosed in October 2015.  PRIOR THERAPY:  1) Systemic chemotherapy with cisplatin 60 MG/M2 on day 1 and etoposide 120 MG/M2 on days 1, 2 and 3 with Neulasta support on day 4, status post 6 cycles. 2) prophylactic cranial irradiation under the care of Dr. Sondra Come. 3) stereotactic radiotherapy to the progressive brain lesions under the care of Dr. [name].  CURRENT THERAPY: Systemic chemotherapy with carboplatin for AUC of 5 on day 1 and etoposide 100 MG/M2 on days 1, 2 and 3 with Neulasta support on day 4. First dose is expected on 09/28/2015.  INTERVAL HISTORY: Spencer Long 64 y.o. male returns to the clinic today for follow-up visit accompanied by his wife. The patient is feeling much better today. He was admitted to [facility] with mental status changes and confusion secondary to significant hyponatremia. He feels much better now and undergoing physical therapy. He completed stereotactic radiotherapy to the metastatic brain lesions. The patient denied having any fever or chills. He has no current nausea or vomiting. No significant chest pain and shortness breath, cough or hemoptysis. He is here to resume his systemic chemotherapy with carboplatin and etoposide.  MEDICAL HISTORY: Past Medical History  Diagnosis Date  . Hypertension     under control, has been on med. x 15-20 yrs.  [name] GERD (gastroesophageal reflux disease)   . Infection of elbow 2011    right-no infection at present , but has skin lesions present  . Sarcoidosis ([place])     "tends to have skin flareups" and presently experiencing now"feet, legs and arms"  . Chronic hyponatremia     Dr. Buddy Duty follows  . Radiation 02/11/15-02/25/15    brain 25 Gy  . DNR no code (do not  resuscitate) 08/02/2015  . Lung cancer ([place])     ALLERGIES:  is allergic to varenicline; isothiazolinone chloride; oxycodone; and quaternium-15.  MEDICATIONS:  Current Outpatient Prescriptions  Medication Sig Dispense Refill  . acetaminophen (TYLENOL) 325 MG tablet Take 2 tablets (650 mg total) by mouth every 6 (six) hours as needed for mild pain (or Fever >/= 101). 15 tablet 0  . Albuterol Sulfate (PROAIR RESPICLICK) 024 (90 BASE) MCG/ACT AEPB Inhale 2 puffs into the lungs every 4 (four) hours as needed. 1 each 2  . amLODipine (NORVASC) 10 MG tablet Take 10 mg by mouth every morning.     [name] aspirin EC 81 MG tablet Take 81 mg by mouth every morning.     [name] atenolol (TENORMIN) 25 MG tablet Take 25 mg by mouth 2 (two) times daily.    [name] buPROPion (WELLBUTRIN SR) 150 MG 12 hr tablet Take 1 tablet (150 mg total) by mouth 2 (two) times daily. 180 tablet 1  . dexamethasone (DECADRON) 4 MG tablet Take 1 tablet (4 mg total) by mouth 4 (four) times daily. 120 tablet 0  . dicyclomine (BENTYL) 20 MG tablet Take 20 mg by mouth every 4 (four) hours as needed for spasms.     . feeding supplement, ENSURE ENLIVE, (ENSURE ENLIVE) LIQD Take 237 mLs by mouth 2 (two) times daily between meals. 097 mL 12  . folic acid (FOLVITE) 1 MG tablet Take 1 mg by mouth 2 (two) times daily.    . furosemide (LASIX) 20 MG  tablet Take 1 tablet (20 mg total) by mouth daily. 30 tablet 0  . lidocaine-prilocaine (EMLA) cream Apply 1 application topically as needed. 30 g 3  . losartan (COZAAR) 100 MG tablet Take 100 mg by mouth daily.    . Multiple Vitamins-Minerals (MULTIVITAMIN ADULTS 50+ PO) Take 1 tablet by mouth every morning.     [name] omeprazole (PRILOSEC) 40 MG capsule Take 40 mg by mouth every morning.    . polyethylene glycol (MIRALAX / GLYCOLAX) packet Take 17 g by mouth daily.    . Probiotic Product (PROBIOTIC DAILY PO) Take 1 tablet by mouth every morning.     . prochlorperazine (COMPAZINE) 10 MG tablet Take 1 tablet (10 mg  total) by mouth every 6 (six) hours as needed for nausea or vomiting. 30 tablet 0  . sennosides-docusate sodium (SENOKOT-S) 8.6-50 MG tablet Take 2 tablets by mouth 2 (two) times daily.    . sodium chloride 1 G tablet Take 2 tablets (2 g total) by mouth 3 (three) times daily with meals. 90 tablet 0  . thiamine 100 MG tablet Take 1 tablet (100 mg total) by mouth daily. 30 tablet 0  . Tiotropium Bromide Monohydrate (SPIRIVA RESPIMAT) 1.25 MCG/ACT AERS Inhale 2 puffs into the lungs daily.    . white petrolatum (VASELINE) GEL Apply 1 application topically daily as needed for lip care or dry skin.     No current facility-administered medications for this visit.    SURGICAL HISTORY:  Past Surgical History  Procedure Laterality Date  . Cholecystectomy  05/2006  . Ulnar nerve repair  01/2008    right; decompression  . Carpal tunnel release  01/2008    right  . Eye surgery  12/2008    correction ectropion, release band, wedge exc., shortening  . I&d extremity  09/12/2011    Procedure: IRRIGATION AND DEBRIDEMENT EXTREMITY;  Surgeon: Tennis Must;  Location: [facility];  Service: Orthopedics;  Laterality: Right;  I&d RIGHT OLECRANON BURSSA   . Tonsillectomy    . Endobronchial ultrasound Bilateral 08/24/2014    Procedure: ENDOBRONCHIAL ULTRASOUND;  Surgeon: Collene Gobble, MD;  Location: [facility];  Service: Cardiopulmonary;  Laterality: Bilateral;    REVIEW OF SYSTEMS:  Constitutional: positive for fatigue Eyes: negative Ears, nose, mouth, throat, and face: negative Respiratory: negative Cardiovascular: negative Gastrointestinal: negative Genitourinary:negative Integument/breast: negative Hematologic/lymphatic: negative Musculoskeletal:positive for muscle weakness Neurological: positive for coordination problems and weakness Behavioral/Psych: negative Endocrine: negative Allergic/Immunologic: negative   PHYSICAL EXAMINATION: General appearance: alert, cooperative,  fatigued and no distress Head: Normocephalic, without obvious abnormality, atraumatic Neck: no adenopathy, no JVD, supple, symmetrical, trachea midline and thyroid not enlarged, symmetric, no tenderness/mass/nodules Lymph nodes: Cervical, supraclavicular, and axillary nodes normal. Resp: clear to auscultation bilaterally Back: symmetric, no curvature. ROM normal. No CVA tenderness. Cardio: regular rate and rhythm, S1, S2 normal, no murmur, click, rub or gallop GI: soft, non-tender; bowel sounds normal; no masses,  no organomegaly Extremities: extremities normal, atraumatic, no cyanosis or edema Neurologic: Alert and oriented X 3, normal strength and tone. Normal symmetric reflexes. Normal coordination and gait  ECOG PERFORMANCE STATUS: 1 - Symptomatic but completely ambulatory  Blood pressure 112/56, pulse 75, temperature 98.6 F (37 C), temperature source Oral, resp. rate 18, height '5\' 8"'$  (1.727 m), weight 155 lb (70.308 kg), SpO2 95 %.  LABORATORY DATA: Lab Results  Component Value Date   WBC 16.1* 09/28/2015   HGB 13.4 09/28/2015   HCT 40.3 09/28/2015   MCV 93.5 09/28/2015  PLT 209 09/28/2015      Chemistry      Component Value Date/Time   NA 130* 09/14/2015 1225   NA 123* 07/29/2015 0827   K 4.0 09/14/2015 0547   K 5.6* 07/29/2015 0827   CL 100* 09/14/2015 0547   CO2 25 09/14/2015 0547   CO2 23 07/29/2015 0827   BUN 26* 09/14/2015 0547   BUN 13.5 07/29/2015 0827   CREATININE 0.74 09/14/2015 0547   CREATININE 0.8 07/29/2015 0827      Component Value Date/Time   CALCIUM 8.6* 09/14/2015 0547   CALCIUM 9.5 07/29/2015 0827   ALKPHOS 87 09/06/2015 0817   ALKPHOS 80 07/29/2015 0827   AST 37 09/06/2015 0817   AST 15 07/29/2015 0827   ALT 26 09/06/2015 0817   ALT 18 07/29/2015 0827   BILITOT 1.1 09/06/2015 0817   BILITOT 0.78 07/29/2015 0827       RADIOGRAPHIC STUDIES: Dg Chest 1 View  09/06/2015  CLINICAL DATA:  64 year old male with new altered mental status.  Lung cancer with recently diagnosed metastatic disease to the brain. Subsequent encounter. EXAM: CHEST 1 VIEW COMPARISON:  PET-CT 08/16/2015, and earlier. FINDINGS: Right chest porta cath now in place. Stable cardiac size and mediastinal contours. Stable lung volumes. No pneumothorax, pulmonary edema, pleural effusion. Continued streaky retrocardiac opacity. Increased interstitial markings in both lungs may reflect vascular congestion, no overt edema. IMPRESSION: Stable from recent PET-CT.  No new cardiopulmonary abnormality. Electronically Signed   By: [name] M.D.   On: 09/06/2015 08:30   Ct Head Wo Contrast  09/06/2015  CLINICAL DATA:  Altered mental status, history of small cell lung carcinoma with intracranial metastatic disease EXAM: CT HEAD WITHOUT CONTRAST TECHNIQUE: Contiguous axial images were obtained from the base of the skull through the vertex without intravenous contrast. COMPARISON:  08/25/2015 FINDINGS: Bony calvarium is intact. No gross soft tissue abnormality is noted. Areas a scatter white matter ischemic change are seen. The known metastatic lesions are not well appreciated on this exam. No significant white matter edema is noted. Motion artifact somewhat degrades the study. No acute hemorrhage is seen. IMPRESSION: No acute abnormality noted.  Mild chronic ischemic changes are seen. Electronically Signed   By: [name] M.D.   On: 09/06/2015 08:56     ASSESSMENT AND PLAN: This is a very pleasant 64 years old white male with extensive stage small cell lung cancer currently undergoing systemic chemotherapy with cisplatin and etoposide status post 6 cycles and tolerated his treatment fairly well. This was followed by prophylactic cranial irradiation. Unfortunately the recent MRI of the brain as well as PET scan showed evidence for disease recurrence. He underwent stereotactic radiotherapy to the metastatic brain lesion and the patient is here today to start the first cycle of systemic  chemotherapy with carboplatin and etoposide. We will proceed with the first cycle today as scheduled. He is currently on Decadron 4 mg by mouth every 6 hours and advice the patient his wife to start tapering this regimen to 4 mg 3 times a day for 1 week followed by 4 mg twice a day for 1 week followed by 4 mg by mouth daily before discontinuing the treatment. I will arrange for the patient to come back for follow-up visit in one week for reevaluation and management of any adverse effect of his treatment. CODE STATUS: no CODE BLUE.  He was advised to call immediately if he has any concerning symptoms in the interval. The patient voices understanding of  current disease status and treatment options and is in agreement with the current care plan.  All questions were answered. The patient knows to call the clinic with any problems, questions or concerns. We can certainly see the patient much sooner if necessary.  Disclaimer: This note was dictated with voice recognition software. Similar sounding words can inadvertently be transcribed and may not be corrected upon review.       

## 2015-09-28 NOTE — Patient Instructions (Signed)
[facility] Discharge Instructions for Patients Receiving Chemotherapy  Today you received the following chemotherapy agents: Etoposide and Carboplatin   To help prevent nausea and vomiting after your treatment, we encourage you to take your nausea medication as directed.    If you develop nausea and vomiting that is not controlled by your nausea medication, call the clinic.   BELOW ARE SYMPTOMS THAT SHOULD BE REPORTED IMMEDIATELY:  *FEVER GREATER THAN 100.5 F  *CHILLS WITH OR WITHOUT FEVER  NAUSEA AND VOMITING THAT IS NOT CONTROLLED WITH YOUR NAUSEA MEDICATION  *UNUSUAL SHORTNESS OF BREATH  *UNUSUAL BRUISING OR BLEEDING  TENDERNESS IN MOUTH AND THROAT WITH OR WITHOUT PRESENCE OF ULCERS  *URINARY PROBLEMS  *BOWEL PROBLEMS  UNUSUAL RASH Items with * indicate a potential emergency and should be followed up as soon as possible.  Feel free to call the clinic you have any questions or concerns. The clinic phone number is [phone].  Please show the [facility] at check-in to the Emergency Department and triage nurse.   Carboplatin injection What is this medicine? CARBOPLATIN (KAR boe pla tin) is a chemotherapy drug. It targets fast dividing cells, like cancer cells, and causes these cells to die. This medicine is used to treat ovarian cancer and many other cancers. This medicine may be used for other purposes; ask your health care provider or pharmacist if you have questions. What should I tell my health care provider before I take this medicine? They need to know if you have any of these conditions: -blood disorders -hearing problems -kidney disease -recent or ongoing radiation therapy -an unusual or allergic reaction to carboplatin, cisplatin, other chemotherapy, other medicines, foods, dyes, or preservatives -pregnant or trying to get pregnant -breast-feeding How should I use this medicine? This drug is usually given as an infusion into a vein.  It is administered in a hospital or clinic by a specially trained health care professional. Talk to your pediatrician regarding the use of this medicine in children. Special care may be needed. Overdosage: If you think you have taken too much of this medicine contact a poison control center or emergency room at once. NOTE: This medicine is only for you. Do not share this medicine with others. What if I miss a dose? It is important not to miss a dose. Call your doctor or health care professional if you are unable to keep an appointment. What may interact with this medicine? -medicines for seizures -medicines to increase blood counts like filgrastim, pegfilgrastim, sargramostim -some antibiotics like amikacin, gentamicin, neomycin, streptomycin, tobramycin -vaccines Talk to your doctor or health care professional before taking any of these medicines: -acetaminophen -aspirin -ibuprofen -ketoprofen -naproxen This list may not describe all possible interactions. Give your health care provider a list of all the medicines, herbs, non-prescription drugs, or dietary supplements you use. Also tell them if you smoke, drink alcohol, or use illegal drugs. Some items may interact with your medicine. What should I watch for while using this medicine? Your condition will be monitored carefully while you are receiving this medicine. You will need important blood work done while you are taking this medicine. This drug may make you feel generally unwell. This is not uncommon, as chemotherapy can affect healthy cells as well as cancer cells. Report any side effects. Continue your course of treatment even though you feel ill unless your doctor tells you to stop. In some cases, you may be given additional medicines to help with side effects. Follow all  directions for their use. Call your doctor or health care professional for advice if you get a fever, chills or sore throat, or other symptoms of a cold or flu. Do  not treat yourself. This drug decreases your body's ability to fight infections. Try to avoid being around people who are sick. This medicine may increase your risk to bruise or bleed. Call your doctor or health care professional if you notice any unusual bleeding. Be careful brushing and flossing your teeth or using a toothpick because you may get an infection or bleed more easily. If you have any dental work done, tell your dentist you are receiving this medicine. Avoid taking products that contain aspirin, acetaminophen, ibuprofen, naproxen, or ketoprofen unless instructed by your doctor. These medicines may hide a fever. Do not become pregnant while taking this medicine. Women should inform their doctor if they wish to become pregnant or think they might be pregnant. There is a potential for serious side effects to an unborn child. Talk to your health care professional or pharmacist for more information. Do not breast-feed an infant while taking this medicine. What side effects may I notice from receiving this medicine? Side effects that you should report to your doctor or health care professional as soon as possible: -allergic reactions like skin rash, itching or hives, swelling of the face, lips, or tongue -signs of infection - fever or chills, cough, sore throat, pain or difficulty passing urine -signs of decreased platelets or bleeding - bruising, pinpoint red spots on the skin, black, tarry stools, nosebleeds -signs of decreased red blood cells - unusually weak or tired, fainting spells, lightheadedness -breathing problems -changes in hearing -changes in vision -chest pain -high blood pressure -low blood counts - This drug may decrease the number of white blood cells, red blood cells and platelets. You may be at increased risk for infections and bleeding. -nausea and vomiting -pain, swelling, redness or irritation at the injection site -pain, tingling, numbness in the hands or  feet -problems with balance, talking, walking -trouble passing urine or change in the amount of urine Side effects that usually do not require medical attention (report to your doctor or health care professional if they continue or are bothersome): -hair loss -loss of appetite -metallic taste in the mouth or changes in taste This list may not describe all possible side effects. Call your doctor for medical advice about side effects. You may report side effects to FDA at 1-800-FDA-1088. Where should I keep my medicine? This drug is given in a hospital or clinic and will not be stored at home. NOTE: This sheet is a summary. It may not cover all possible information. If you have questions about this medicine, talk to your doctor, pharmacist, or health care provider.    2016, Elsevier/Gold Standard. (2008-01-21 14:38:05)    Etoposide, VP-16 injection What is this medicine? ETOPOSIDE, VP-16 (e toe POE side) is a chemotherapy drug. It is used to treat testicular cancer, lung cancer, and other cancers. This medicine may be used for other purposes; ask your health care provider or pharmacist if you have questions. What should I tell my health care provider before I take this medicine? They need to know if you have any of these conditions: -infection -kidney disease -low blood counts, like low white cell, platelet, or red cell counts -an unusual or allergic reaction to etoposide, other chemotherapeutic agents, other medicines, foods, dyes, or preservatives -pregnant or trying to get pregnant -breast-feeding How should I use this medicine?  This medicine is for infusion into a vein. It is administered in a hospital or clinic by a specially trained health care professional. Talk to your pediatrician regarding the use of this medicine in children. Special care may be needed. Overdosage: If you think you have taken too much of this medicine contact a poison control center or emergency room at  once. NOTE: This medicine is only for you. Do not share this medicine with others. What if I miss a dose? It is important not to miss your dose. Call your doctor or health care professional if you are unable to keep an appointment. What may interact with this medicine? -aspirin -certain medications for seizures like carbamazepine, phenobarbital, phenytoin, valproic acid -cyclosporine -levamisole -warfarin This list may not describe all possible interactions. Give your health care provider a list of all the medicines, herbs, non-prescription drugs, or dietary supplements you use. Also tell them if you smoke, drink alcohol, or use illegal drugs. Some items may interact with your medicine. What should I watch for while using this medicine? Visit your doctor for checks on your progress. This drug may make you feel generally unwell. This is not uncommon, as chemotherapy can affect healthy cells as well as cancer cells. Report any side effects. Continue your course of treatment even though you feel ill unless your doctor tells you to stop. In some cases, you may be given additional medicines to help with side effects. Follow all directions for their use. Call your doctor or health care professional for advice if you get a fever, chills or sore throat, or other symptoms of a cold or flu. Do not treat yourself. This drug decreases your body's ability to fight infections. Try to avoid being around people who are sick. This medicine may increase your risk to bruise or bleed. Call your doctor or health care professional if you notice any unusual bleeding. Be careful brushing and flossing your teeth or using a toothpick because you may get an infection or bleed more easily. If you have any dental work done, tell your dentist you are receiving this medicine. Avoid taking products that contain aspirin, acetaminophen, ibuprofen, naproxen, or ketoprofen unless instructed by your doctor. These medicines may hide a  fever. Do not become pregnant while taking this medicine or for at least 6 months after stopping it. Women should inform their doctor if they wish to become pregnant or think they might be pregnant. Women of child-bearing potential will need to have a negative pregnancy test before starting this medicine. There is a potential for serious side effects to an unborn child. Talk to your health care professional or pharmacist for more information. Do not breast-feed an infant while taking this medicine. Men must use a latex condom during sexual contact with a woman while taking this medicine and for at least 4 months after stopping it. A latex condom is needed even if you have had a vasectomy. Contact your doctor right away if your partner becomes pregnant. Do not donate sperm while taking this medicine and for at least 4 months after you stop taking this medicine. Men should inform their doctors if they wish to father a child. This medicine may lower sperm counts. What side effects may I notice from receiving this medicine? Side effects that you should report to your doctor or health care professional as soon as possible: -allergic reactions like skin rash, itching or hives, swelling of the face, lips, or tongue -low blood counts - this medicine may decrease  the number of white blood cells, red blood cells and platelets. You may be at increased risk for infections and bleeding. -signs of infection - fever or chills, cough, sore throat, pain or difficulty passing urine -signs of decreased platelets or bleeding - bruising, pinpoint red spots on the skin, black, tarry stools, blood in the urine -signs of decreased red blood cells - unusually weak or tired, fainting spells, lightheadedness -breathing problems -changes in vision -mouth or throat sores or ulcers -pain, redness, swelling or irritation at the injection site -pain, tingling, numbness in the hands or feet -redness, blistering, peeling or loosening  of the skin, including inside the mouth -seizures -vomiting Side effects that usually do not require medical attention (report to your doctor or health care professional if they continue or are bothersome): -diarrhea -hair loss -loss of appetite -nausea -stomach pain This list may not describe all possible side effects. Call your doctor for medical advice about side effects. You may report side effects to FDA at 1-800-FDA-1088. Where should I keep my medicine? This drug is given in a hospital or clinic and will not be stored at home. NOTE: This sheet is a summary. It may not cover all possible information. If you have questions about this medicine, talk to your doctor, pharmacist, or health care provider.    2016, Elsevier/Gold Standard. (2014-06-11 12:32:50)

## 2015-09-28 NOTE — Telephone Encounter (Signed)
Gave and pritned appt sched and avs for pt for NOV thru Jan 2017

## 2015-09-28 NOTE — Progress Notes (Signed)
BUN 40.8, reviewed labs with Dr. [name]. Okay to proceed with Treatment per Dr. Earlie Server. Pt unable to physically sign consent, pt verbalizes understanding of treatment and verbalizes consent to start treatment. Wife at chairside and signs consent as next of kin.

## 2015-09-28 NOTE — Progress Notes (Addendum)
Brief nutrition follow-up completed with patient and wife, during infusion for small cell lung cancer. Patient's wife updated me on patient's condition over the past several months. Currently patient is living at home and appears to be eating better. He denies pain of any kind. Weight was documented as 155 pounds on November 29.  Will follow-up with patient during infusion on Tuesday, December 20, at wife's request.

## 2015-09-29 ENCOUNTER — Ambulatory Visit (HOSPITAL_BASED_OUTPATIENT_CLINIC_OR_DEPARTMENT_OTHER): Payer: Commercial Managed Care - HMO

## 2015-09-29 ENCOUNTER — Telehealth: Payer: Self-pay | Admitting: Emergency Medicine

## 2015-09-29 ENCOUNTER — Telehealth: Payer: Self-pay | Admitting: *Deleted

## 2015-09-29 VITALS — BP 106/56 | HR 74 | Temp 98.0°F | Resp 18

## 2015-09-29 DIAGNOSIS — Z5111 Encounter for antineoplastic chemotherapy: Secondary | ICD-10-CM

## 2015-09-29 DIAGNOSIS — C3490 Malignant neoplasm of unspecified part of unspecified bronchus or lung: Secondary | ICD-10-CM

## 2015-09-29 DIAGNOSIS — C3492 Malignant neoplasm of unspecified part of left bronchus or lung: Secondary | ICD-10-CM

## 2015-09-29 MED ORDER — SODIUM CHLORIDE 0.9 % IJ SOLN
10.0000 mL | INTRAMUSCULAR | Status: DC | PRN
  Administered 2015-09-29: 10 mL
  Filled 2015-09-29: qty 10

## 2015-09-29 MED ORDER — HEPARIN SOD (PORK) LOCK FLUSH 100 UNIT/ML IV SOLN
500.0000 [IU] | Freq: Once | INTRAVENOUS | Status: AC | PRN
  Administered 2015-09-29: 500 [IU]
  Filled 2015-09-29: qty 5

## 2015-09-29 MED ORDER — SODIUM CHLORIDE 0.9 % IV SOLN
Freq: Once | INTRAVENOUS | Status: AC
  Administered 2015-09-29: 10:00:00 via INTRAVENOUS

## 2015-09-29 MED ORDER — SODIUM CHLORIDE 0.9 % IV SOLN
100.0000 mg/m2 | Freq: Once | INTRAVENOUS | Status: AC
  Administered 2015-09-29: 190 mg via INTRAVENOUS
  Filled 2015-09-29: qty 9.5

## 2015-09-29 MED ORDER — PROCHLORPERAZINE MALEATE 10 MG PO TABS: 10.0000 mg | ORAL_TABLET | Freq: Once | ORAL | Status: DC

## 2015-09-29 NOTE — Telephone Encounter (Signed)
Called and spoke to pt's wife. Pt's wife is questioning if pt needs to come in for appt. Advised pt's wife that RB advised a 3 month f/u and would be ideal if the appt was kept but if pt is worried about being susceptible to infection or is not feeling up to appt then it can be rescheduled. Pt has appt with RB on 12/6. Pt's wife stated the pt is doing well with chemo and is feeling well and will keep appt if everything is going well. Nothing further is needed at this time.

## 2015-09-29 NOTE — Telephone Encounter (Signed)
Pt states he did well with chemo. Denies any N/V or diarrhea. No questions or concerns

## 2015-09-29 NOTE — Telephone Encounter (Signed)
-----   Message from Egbert Garibaldi, RN sent at 09/28/2015  3:58 PM EST ----- Regarding: Spencer Long first time chemo f/u  Pt of Dr. Julien Long. First time Carboplatin and Etoposide. Pt tolerated well.

## 2015-09-29 NOTE — Patient Instructions (Signed)
Etoposide, VP-16 injection  What is this medicine?  ETOPOSIDE, VP-16 (e toe POE side) is a chemotherapy drug. It is used to treat testicular cancer, lung cancer, and other cancers.  This medicine may be used for other purposes; ask your health care provider or pharmacist if you have questions.  What should I tell my health care provider before I take this medicine?  They need to know if you have any of these conditions:  -infection  -kidney disease  -low blood counts, like low white cell, platelet, or red cell counts  -an unusual or allergic reaction to etoposide, other chemotherapeutic agents, other medicines, foods, dyes, or preservatives  -pregnant or trying to get pregnant  -breast-feeding  How should I use this medicine?  This medicine is for infusion into a vein. It is administered in a hospital or clinic by a specially trained health care professional.  Talk to your pediatrician regarding the use of this medicine in children. Special care may be needed.  Overdosage: If you think you have taken too much of this medicine contact a poison control center or emergency room at once.  NOTE: This medicine is only for you. Do not share this medicine with others.  What if I miss a dose?  It is important not to miss your dose. Call your doctor or health care professional if you are unable to keep an appointment.  What may interact with this medicine?  -aspirin  -certain medications for seizures like carbamazepine, phenobarbital, phenytoin, valproic acid  -cyclosporine  -levamisole  -warfarin  This list may not describe all possible interactions. Give your health care provider a list of all the medicines, herbs, non-prescription drugs, or dietary supplements you use. Also tell them if you smoke, drink alcohol, or use illegal drugs. Some items may interact with your medicine.  What should I watch for while using this medicine?  Visit your doctor for checks on your progress. This drug may make you feel generally unwell.  This is not uncommon, as chemotherapy can affect healthy cells as well as cancer cells. Report any side effects. Continue your course of treatment even though you feel ill unless your doctor tells you to stop.  In some cases, you may be given additional medicines to help with side effects. Follow all directions for their use.  Call your doctor or health care professional for advice if you get a fever, chills or sore throat, or other symptoms of a cold or flu. Do not treat yourself. This drug decreases your body's ability to fight infections. Try to avoid being around people who are sick.  This medicine may increase your risk to bruise or bleed. Call your doctor or health care professional if you notice any unusual bleeding.  Be careful brushing and flossing your teeth or using a toothpick because you may get an infection or bleed more easily. If you have any dental work done, tell your dentist you are receiving this medicine.  Avoid taking products that contain aspirin, acetaminophen, ibuprofen, naproxen, or ketoprofen unless instructed by your doctor. These medicines may hide a fever.  Do not become pregnant while taking this medicine or for at least 6 months after stopping it. Women should inform their doctor if they wish to become pregnant or think they might be pregnant. Women of child-bearing potential will need to have a negative pregnancy test before starting this medicine. There is a potential for serious side effects to an unborn child. Talk to your health care professional or   pharmacist for more information. Do not breast-feed an infant while taking this medicine.  Men must use a latex condom during sexual contact with a woman while taking this medicine and for at least 4 months after stopping it. A latex condom is needed even if you have had a vasectomy. Contact your doctor right away if your partner becomes pregnant. Do not donate sperm while taking this medicine and for at least 4 months after you stop  taking this medicine. Men should inform their doctors if they wish to father a child. This medicine may lower sperm counts.  What side effects may I notice from receiving this medicine?  Side effects that you should report to your doctor or health care professional as soon as possible:  -allergic reactions like skin rash, itching or hives, swelling of the face, lips, or tongue  -low blood counts - this medicine may decrease the number of white blood cells, red blood cells and platelets. You may be at increased risk for infections and bleeding.  -signs of infection - fever or chills, cough, sore throat, pain or difficulty passing urine  -signs of decreased platelets or bleeding - bruising, pinpoint red spots on the skin, black, tarry stools, blood in the urine  -signs of decreased red blood cells - unusually weak or tired, fainting spells, lightheadedness  -breathing problems  -changes in vision  -mouth or throat sores or ulcers  -pain, redness, swelling or irritation at the injection site  -pain, tingling, numbness in the hands or feet  -redness, blistering, peeling or loosening of the skin, including inside the mouth  -seizures  -vomiting  Side effects that usually do not require medical attention (report to your doctor or health care professional if they continue or are bothersome):  -diarrhea  -hair loss  -loss of appetite  -nausea  -stomach pain  This list may not describe all possible side effects. Call your doctor for medical advice about side effects. You may report side effects to FDA at 1-800-FDA-1088.  Where should I keep my medicine?  This drug is given in a hospital or clinic and will not be stored at home.  NOTE: This sheet is a summary. It may not cover all possible information. If you have questions about this medicine, talk to your doctor, pharmacist, or health care provider.      2016, Elsevier/Gold Standard. (2014-06-11 12:32:50)

## 2015-09-30 ENCOUNTER — Ambulatory Visit (HOSPITAL_BASED_OUTPATIENT_CLINIC_OR_DEPARTMENT_OTHER)
Admission: RE | Admit: 2015-09-30 | Discharge: 2015-09-30 | Disposition: A | Discharge status: Home / Self Care | Payer: Commercial Managed Care - HMO | Source: Ambulatory Visit | Attending: Nurse Practitioner | Admitting: Nurse Practitioner

## 2015-09-30 ENCOUNTER — Ambulatory Visit (HOSPITAL_BASED_OUTPATIENT_CLINIC_OR_DEPARTMENT_OTHER): Payer: Commercial Managed Care - HMO

## 2015-09-30 ENCOUNTER — Ambulatory Visit (HOSPITAL_BASED_OUTPATIENT_CLINIC_OR_DEPARTMENT_OTHER): Payer: Commercial Managed Care - HMO | Admitting: Nurse Practitioner

## 2015-09-30 VITALS — BP 113/56 | HR 76 | Temp 98.3°F | Resp 16

## 2015-09-30 DIAGNOSIS — C3492 Malignant neoplasm of unspecified part of left bronchus or lung: Secondary | ICD-10-CM

## 2015-09-30 DIAGNOSIS — Z923 Personal history of irradiation: Secondary | ICD-10-CM

## 2015-09-30 DIAGNOSIS — K219 Gastro-esophageal reflux disease without esophagitis: Secondary | ICD-10-CM | POA: Insufficient documentation

## 2015-09-30 DIAGNOSIS — E871 Hypo-osmolality and hyponatremia: Secondary | ICD-10-CM | POA: Insufficient documentation

## 2015-09-30 DIAGNOSIS — R609 Edema, unspecified: Secondary | ICD-10-CM | POA: Diagnosis not present

## 2015-09-30 DIAGNOSIS — Z5111 Encounter for antineoplastic chemotherapy: Secondary | ICD-10-CM | POA: Diagnosis not present

## 2015-09-30 DIAGNOSIS — C3490 Malignant neoplasm of unspecified part of unspecified bronchus or lung: Secondary | ICD-10-CM

## 2015-09-30 DIAGNOSIS — A419 Sepsis, unspecified organism: Secondary | ICD-10-CM | POA: Diagnosis not present

## 2015-09-30 DIAGNOSIS — I10 Essential (primary) hypertension: Secondary | ICD-10-CM | POA: Insufficient documentation

## 2015-09-30 DIAGNOSIS — L989 Disorder of the skin and subcutaneous tissue, unspecified: Secondary | ICD-10-CM | POA: Insufficient documentation

## 2015-09-30 DIAGNOSIS — R0602 Shortness of breath: Secondary | ICD-10-CM | POA: Diagnosis not present

## 2015-09-30 MED ORDER — PEGFILGRASTIM 6 MG/0.6ML ~~LOC~~ PSKT
6.0000 mg | PREFILLED_SYRINGE | Freq: Once | SUBCUTANEOUS | Status: AC
  Administered 2015-09-30: 6 mg via SUBCUTANEOUS
  Filled 2015-09-30: qty 0.6

## 2015-09-30 MED ORDER — SODIUM CHLORIDE 0.9 % IV SOLN
100.0000 mg/m2 | Freq: Once | INTRAVENOUS | Status: AC
  Administered 2015-09-30: 190 mg via INTRAVENOUS
  Filled 2015-09-30: qty 9.5

## 2015-09-30 MED ORDER — PROCHLORPERAZINE MALEATE 10 MG PO TABS: 10.0000 mg | ORAL_TABLET | Freq: Once | ORAL | Status: DC

## 2015-09-30 MED ORDER — SODIUM CHLORIDE 0.9 % IJ SOLN
10.0000 mL | INTRAMUSCULAR | Status: DC | PRN
  Administered 2015-09-30: 10 mL
  Filled 2015-09-30: qty 10

## 2015-09-30 MED ORDER — HEPARIN SOD (PORK) LOCK FLUSH 100 UNIT/ML IV SOLN
500.0000 [IU] | Freq: Once | INTRAVENOUS | Status: AC | PRN
  Administered 2015-09-30: 500 [IU]
  Filled 2015-09-30: qty 5

## 2015-09-30 MED ORDER — SODIUM CHLORIDE 0.9 % IV SOLN
Freq: Once | INTRAVENOUS | Status: AC
  Administered 2015-09-30: 09:00:00 via INTRAVENOUS

## 2015-09-30 NOTE — Progress Notes (Signed)
Selena Lesser, NP arrived chairside for pt evaluation.  Okay given to proceed with chemo treatment and pt to get [place] if L leg once infusion complete.  Pt and family in agreement with plan and no further questions prior to discharge.

## 2015-09-30 NOTE — Progress Notes (Signed)
VASCULAR LAB PRELIMINARY  PRELIMINARY  PRELIMINARY  PRELIMINARY  Left lower extremity venous duplex completed.    Preliminary report:  Left:  No evidence of DVT, superficial thrombosis, or Baker's cyst.  Kambrie Eddleman, RVT 09/30/2015, 12:44 PM

## 2015-09-30 NOTE — Progress Notes (Signed)
Patient reports bilateral lower leg edema and pain in calves.  Left leg has more obvious swelling than the right and is warmer to the touch than the right foot.  Pain extends up to both knees and he has a difficult time ambulating today - onset noted as today. He has no shortness of breath or fever.  Dr. Earlie Server aware and Jenny Reichmann NP will come to evaluate patient today prior to treatment.

## 2015-10-01 ENCOUNTER — Ambulatory Visit: Payer: Commercial Managed Care - HMO

## 2015-10-03 ENCOUNTER — Encounter (HOSPITAL_COMMUNITY): Payer: Self-pay

## 2015-10-03 ENCOUNTER — Emergency Department (HOSPITAL_COMMUNITY): Payer: Commercial Managed Care - HMO

## 2015-10-03 ENCOUNTER — Inpatient Hospital Stay (HOSPITAL_COMMUNITY)
Admission: EM | Admit: 2015-10-03 | Discharge: 2015-10-08 | DRG: 871 | Disposition: A | Discharge status: Hospice | Payer: Commercial Managed Care - HMO | Attending: Family Medicine | Admitting: Family Medicine

## 2015-10-03 ENCOUNTER — Other Ambulatory Visit: Payer: Self-pay

## 2015-10-03 DIAGNOSIS — D869 Sarcoidosis, unspecified: Secondary | ICD-10-CM | POA: Diagnosis present

## 2015-10-03 DIAGNOSIS — F419 Anxiety disorder, unspecified: Secondary | ICD-10-CM | POA: Diagnosis present

## 2015-10-03 DIAGNOSIS — F32A Depression, unspecified: Secondary | ICD-10-CM | POA: Diagnosis present

## 2015-10-03 DIAGNOSIS — J189 Pneumonia, unspecified organism: Secondary | ICD-10-CM | POA: Diagnosis not present

## 2015-10-03 DIAGNOSIS — J441 Chronic obstructive pulmonary disease with (acute) exacerbation: Secondary | ICD-10-CM | POA: Diagnosis present

## 2015-10-03 DIAGNOSIS — J690 Pneumonitis due to inhalation of food and vomit: Secondary | ICD-10-CM | POA: Diagnosis present

## 2015-10-03 DIAGNOSIS — B379 Candidiasis, unspecified: Secondary | ICD-10-CM | POA: Diagnosis present

## 2015-10-03 DIAGNOSIS — Z7952 Long term (current) use of systemic steroids: Secondary | ICD-10-CM | POA: Diagnosis not present

## 2015-10-03 DIAGNOSIS — F17200 Nicotine dependence, unspecified, uncomplicated: Secondary | ICD-10-CM | POA: Diagnosis present

## 2015-10-03 DIAGNOSIS — Z9221 Personal history of antineoplastic chemotherapy: Secondary | ICD-10-CM | POA: Diagnosis not present

## 2015-10-03 DIAGNOSIS — R0902 Hypoxemia: Secondary | ICD-10-CM

## 2015-10-03 DIAGNOSIS — Z888 Allergy status to other drugs, medicaments and biological substances status: Secondary | ICD-10-CM | POA: Diagnosis not present

## 2015-10-03 DIAGNOSIS — K219 Gastro-esophageal reflux disease without esophagitis: Secondary | ICD-10-CM | POA: Diagnosis present

## 2015-10-03 DIAGNOSIS — Z806 Family history of leukemia: Secondary | ICD-10-CM

## 2015-10-03 DIAGNOSIS — F329 Major depressive disorder, single episode, unspecified: Secondary | ICD-10-CM | POA: Diagnosis present

## 2015-10-03 DIAGNOSIS — A419 Sepsis, unspecified organism: Principal | ICD-10-CM | POA: Diagnosis present

## 2015-10-03 DIAGNOSIS — E871 Hypo-osmolality and hyponatremia: Secondary | ICD-10-CM | POA: Diagnosis present

## 2015-10-03 DIAGNOSIS — Z66 Do not resuscitate: Secondary | ICD-10-CM | POA: Diagnosis present

## 2015-10-03 DIAGNOSIS — Z885 Allergy status to narcotic agent status: Secondary | ICD-10-CM | POA: Diagnosis not present

## 2015-10-03 DIAGNOSIS — Z803 Family history of malignant neoplasm of breast: Secondary | ICD-10-CM

## 2015-10-03 DIAGNOSIS — Z7982 Long term (current) use of aspirin: Secondary | ICD-10-CM | POA: Diagnosis not present

## 2015-10-03 DIAGNOSIS — D61810 Antineoplastic chemotherapy induced pancytopenia: Secondary | ICD-10-CM | POA: Diagnosis present

## 2015-10-03 DIAGNOSIS — J9621 Acute and chronic respiratory failure with hypoxia: Secondary | ICD-10-CM | POA: Diagnosis present

## 2015-10-03 DIAGNOSIS — E872 Acidosis: Secondary | ICD-10-CM | POA: Diagnosis present

## 2015-10-03 DIAGNOSIS — T451X5A Adverse effect of antineoplastic and immunosuppressive drugs, initial encounter: Secondary | ICD-10-CM | POA: Diagnosis present

## 2015-10-03 DIAGNOSIS — Z515 Encounter for palliative care: Secondary | ICD-10-CM | POA: Diagnosis present

## 2015-10-03 DIAGNOSIS — Z85118 Personal history of other malignant neoplasm of bronchus and lung: Secondary | ICD-10-CM | POA: Diagnosis not present

## 2015-10-03 DIAGNOSIS — F1010 Alcohol abuse, uncomplicated: Secondary | ICD-10-CM | POA: Diagnosis present

## 2015-10-03 DIAGNOSIS — Z79899 Other long term (current) drug therapy: Secondary | ICD-10-CM | POA: Diagnosis not present

## 2015-10-03 DIAGNOSIS — C7931 Secondary malignant neoplasm of brain: Secondary | ICD-10-CM | POA: Diagnosis present

## 2015-10-03 DIAGNOSIS — J449 Chronic obstructive pulmonary disease, unspecified: Secondary | ICD-10-CM | POA: Diagnosis present

## 2015-10-03 DIAGNOSIS — B370 Candidal stomatitis: Secondary | ICD-10-CM

## 2015-10-03 DIAGNOSIS — Y95 Nosocomial condition: Secondary | ICD-10-CM | POA: Diagnosis present

## 2015-10-03 DIAGNOSIS — I10 Essential (primary) hypertension: Secondary | ICD-10-CM | POA: Diagnosis present

## 2015-10-03 DIAGNOSIS — D703 Neutropenia due to infection: Secondary | ICD-10-CM | POA: Diagnosis present

## 2015-10-03 DIAGNOSIS — D61818 Other pancytopenia: Secondary | ICD-10-CM | POA: Diagnosis present

## 2015-10-03 DIAGNOSIS — R0602 Shortness of breath: Secondary | ICD-10-CM | POA: Diagnosis present

## 2015-10-03 DIAGNOSIS — C3490 Malignant neoplasm of unspecified part of unspecified bronchus or lung: Secondary | ICD-10-CM | POA: Diagnosis present

## 2015-10-03 HISTORY — DX: Secondary malignant neoplasm of brain: C79.31

## 2015-10-03 HISTORY — DX: Depression, unspecified: F32.A

## 2015-10-03 HISTORY — DX: Chronic obstructive pulmonary disease, unspecified: J44.9

## 2015-10-03 HISTORY — DX: Major depressive disorder, single episode, unspecified: F32.9

## 2015-10-03 LAB — BLOOD GAS, ARTERIAL
ACID-BASE DEFICIT: 1.8 mmol/L (ref 0.0–2.0)
Bicarbonate: 20.4 mEq/L (ref 20.0–24.0)
Drawn by: 331471
O2 CONTENT: 6 L/min
O2 SAT: 88.9 %
PATIENT TEMPERATURE: 98.6
PCO2 ART: 28.2 mmHg — AB (ref 35.0–45.0)
TCO2: 18.2 mmol/L (ref 0–100)
pH, Arterial: 7.472 — ABNORMAL HIGH (ref 7.350–7.450)
pO2, Arterial: 58.1 mmHg — ABNORMAL LOW (ref 80.0–100.0)

## 2015-10-03 LAB — CBC
HEMATOCRIT: 34.8 % — AB (ref 39.0–52.0)
HEMOGLOBIN: 12.1 g/dL — AB (ref 13.0–17.0)
MCH: 31.8 pg (ref 26.0–34.0)
MCHC: 34.8 g/dL (ref 30.0–36.0)
MCV: 91.3 fL (ref 78.0–100.0)
Platelets: 131 10*3/uL — ABNORMAL LOW (ref 150–400)
RBC: 3.81 MIL/uL — AB (ref 4.22–5.81)
RDW: 14.4 % (ref 11.5–15.5)
WBC: 2.2 10*3/uL — AB (ref 4.0–10.5)

## 2015-10-03 LAB — BASIC METABOLIC PANEL
ANION GAP: 9 (ref 5–15)
BUN: 38 mg/dL — AB (ref 6–20)
CHLORIDE: 101 mmol/L (ref 101–111)
CO2: 24 mmol/L (ref 22–32)
Calcium: 8 mg/dL — ABNORMAL LOW (ref 8.9–10.3)
Creatinine, Ser: 0.75 mg/dL (ref 0.61–1.24)
GFR calc Af Amer: >60 mL/min (ref >60)
GFR calc non Af Amer: >60 mL/min (ref >60)
GLUCOSE: 131 mg/dL — AB (ref 65–99)
POTASSIUM: 4.5 mmol/L (ref 3.5–5.1)
Sodium: 134 mmol/L — ABNORMAL LOW (ref 135–145)

## 2015-10-03 LAB — I-STAT TROPONIN, ED: Troponin i, poc: 0.01 ng/mL (ref 0.00–0.08)

## 2015-10-03 LAB — PROTIME-INR
INR: 1.02 (ref 0.00–1.49)
Prothrombin Time: 13.6 seconds (ref 11.6–15.2)

## 2015-10-03 LAB — I-STAT CG4 LACTIC ACID, ED: LACTIC ACID, VENOUS: 2.1 mmol/L — AB (ref 0.5–2.0)

## 2015-10-03 LAB — LACTIC ACID, PLASMA: LACTIC ACID, VENOUS: 2.5 mmol/L — AB (ref 0.5–2.0)

## 2015-10-03 LAB — BRAIN NATRIURETIC PEPTIDE: B NATRIURETIC PEPTIDE 5: 69.8 pg/mL (ref 0.0–100.0)

## 2015-10-03 LAB — APTT: APTT: 21 s — AB (ref 24–37)

## 2015-10-03 LAB — PROCALCITONIN

## 2015-10-03 MED ORDER — SODIUM CHLORIDE 0.9 % IV SOLN
INTRAVENOUS | Status: DC
  Administered 2015-10-03 – 2015-10-05 (×3): via INTRAVENOUS

## 2015-10-03 MED ORDER — FLUCONAZOLE 100 MG PO TABS
200.0000 mg | ORAL_TABLET | Freq: Once | ORAL | Status: AC
  Administered 2015-10-03: 200 mg via ORAL
  Filled 2015-10-03: qty 1
  Filled 2015-10-03: qty 2

## 2015-10-03 MED ORDER — WHITE PETROLATUM GEL
1.0000 "application " | Freq: Every day | Status: DC | PRN
  Filled 2015-10-03: qty 5

## 2015-10-03 MED ORDER — PIPERACILLIN-TAZOBACTAM 3.375 G IVPB 30 MIN
3.3750 g | Freq: Three times a day (TID) | INTRAVENOUS | Status: DC
  Administered 2015-10-03: 3.375 g via INTRAVENOUS
  Filled 2015-10-03: qty 50

## 2015-10-03 MED ORDER — SODIUM CHLORIDE 0.9 % IV BOLUS (SEPSIS)
1000.0000 mL | Freq: Once | INTRAVENOUS | Status: AC
  Administered 2015-10-03: 1000 mL via INTRAVENOUS

## 2015-10-03 MED ORDER — LORAZEPAM 2 MG/ML IJ SOLN: 0.0000 mg | Freq: Four times a day (QID) | INTRAMUSCULAR | Status: DC

## 2015-10-03 MED ORDER — ADULT MULTIVITAMIN W/MINERALS CH
1.0000 | ORAL_TABLET | Freq: Every day | ORAL | Status: DC
  Administered 2015-10-04 – 2015-10-07 (×3): 1 via ORAL
  Filled 2015-10-03 (×4): qty 1

## 2015-10-03 MED ORDER — LEVALBUTEROL HCL 1.25 MG/0.5ML IN NEBU
1.2500 mg | INHALATION_SOLUTION | Freq: Four times a day (QID) | RESPIRATORY_TRACT | Status: DC
  Administered 2015-10-03: 1.25 mg via RESPIRATORY_TRACT
  Filled 2015-10-03: qty 0.5

## 2015-10-03 MED ORDER — LEVALBUTEROL HCL 1.25 MG/0.5ML IN NEBU
1.2500 mg | INHALATION_SOLUTION | Freq: Four times a day (QID) | RESPIRATORY_TRACT | Status: DC | PRN
  Administered 2015-10-05: 1.25 mg via RESPIRATORY_TRACT
  Filled 2015-10-03: qty 0.5

## 2015-10-03 MED ORDER — HEPARIN SODIUM (PORCINE) 5000 UNIT/ML IJ SOLN
5000.0000 [IU] | Freq: Three times a day (TID) | INTRAMUSCULAR | Status: DC
  Administered 2015-10-03 – 2015-10-04 (×2): 5000 [IU] via SUBCUTANEOUS
  Filled 2015-10-03 (×4): qty 1

## 2015-10-03 MED ORDER — LORAZEPAM 2 MG/ML IJ SOLN
1.0000 mg | Freq: Four times a day (QID) | INTRAMUSCULAR | Status: DC | PRN
  Administered 2015-10-03 – 2015-10-06 (×3): 1 mg via INTRAVENOUS
  Filled 2015-10-03 (×4): qty 1

## 2015-10-03 MED ORDER — ATENOLOL 25 MG PO TABS
25.0000 mg | ORAL_TABLET | Freq: Two times a day (BID) | ORAL | Status: DC
  Administered 2015-10-03 – 2015-10-07 (×7): 25 mg via ORAL
  Filled 2015-10-03 (×8): qty 1

## 2015-10-03 MED ORDER — LORAZEPAM 1 MG PO TABS: 1.0000 mg | ORAL_TABLET | Freq: Four times a day (QID) | ORAL | Status: DC | PRN

## 2015-10-03 MED ORDER — LIDOCAINE-PRILOCAINE 2.5-2.5 % EX CREA
1.0000 "application " | TOPICAL_CREAM | CUTANEOUS | Status: DC | PRN
  Filled 2015-10-03: qty 5

## 2015-10-03 MED ORDER — SODIUM CHLORIDE 0.9 % IV BOLUS (SEPSIS)
500.0000 mL | Freq: Once | INTRAVENOUS | Status: AC
  Administered 2015-10-03: 500 mL via INTRAVENOUS

## 2015-10-03 MED ORDER — PANTOPRAZOLE SODIUM 40 MG PO TBEC
40.0000 mg | DELAYED_RELEASE_TABLET | Freq: Every day | ORAL | Status: DC
  Administered 2015-10-03 – 2015-10-07 (×4): 40 mg via ORAL
  Filled 2015-10-03 (×5): qty 1

## 2015-10-03 MED ORDER — THIAMINE HCL 100 MG/ML IJ SOLN
100.0000 mg | Freq: Every day | INTRAMUSCULAR | Status: DC
  Administered 2015-10-06 – 2015-10-07 (×2): 100 mg via INTRAVENOUS
  Filled 2015-10-03 (×2): qty 2

## 2015-10-03 MED ORDER — DM-GUAIFENESIN ER 30-600 MG PO TB12
1.0000 | ORAL_TABLET | Freq: Two times a day (BID) | ORAL | Status: DC
  Administered 2015-10-03 – 2015-10-07 (×6): 1 via ORAL
  Filled 2015-10-03 (×12): qty 1

## 2015-10-03 MED ORDER — METHYLPREDNISOLONE SODIUM SUCC 125 MG IJ SOLR
125.0000 mg | Freq: Once | INTRAMUSCULAR | Status: AC
  Administered 2015-10-03: 125 mg via INTRAVENOUS
  Filled 2015-10-03: qty 2

## 2015-10-03 MED ORDER — PIPERACILLIN-TAZOBACTAM 3.375 G IVPB
3.3750 g | Freq: Three times a day (TID) | INTRAVENOUS | Status: DC
  Administered 2015-10-04 – 2015-10-08 (×13): 3.375 g via INTRAVENOUS
  Filled 2015-10-03 (×13): qty 50

## 2015-10-03 MED ORDER — VANCOMYCIN HCL IN DEXTROSE 1-5 GM/200ML-% IV SOLN
1000.0000 mg | Freq: Once | INTRAVENOUS | Status: AC
  Administered 2015-10-03: 1000 mg via INTRAVENOUS
  Filled 2015-10-03: qty 200

## 2015-10-03 MED ORDER — ENSURE ENLIVE PO LIQD
237.0000 mL | Freq: Every day | ORAL | Status: DC
  Administered 2015-10-04: 237 mL via ORAL

## 2015-10-03 MED ORDER — METHYLPREDNISOLONE SODIUM SUCC 125 MG IJ SOLR
60.0000 mg | Freq: Every day | INTRAMUSCULAR | Status: DC
  Administered 2015-10-03 – 2015-10-08 (×6): 60 mg via INTRAVENOUS
  Filled 2015-10-03 (×6): qty 2

## 2015-10-03 MED ORDER — LEVALBUTEROL HCL 0.63 MG/3ML IN NEBU
0.6300 mg | INHALATION_SOLUTION | RESPIRATORY_TRACT | Status: DC
  Administered 2015-10-03 – 2015-10-05 (×9): 0.63 mg via RESPIRATORY_TRACT
  Filled 2015-10-03 (×9): qty 3

## 2015-10-03 MED ORDER — FLORA-Q PO CAPS
1.0000 | ORAL_CAPSULE | Freq: Every day | ORAL | Status: DC
  Administered 2015-10-04 – 2015-10-07 (×3): 1 via ORAL
  Filled 2015-10-03 (×5): qty 1

## 2015-10-03 MED ORDER — TIOTROPIUM BROMIDE MONOHYDRATE 1.25 MCG/ACT IN AERS: 2.0000 | INHALATION_SPRAY | Freq: Every day | RESPIRATORY_TRACT | Status: DC

## 2015-10-03 MED ORDER — ALBUTEROL SULFATE (2.5 MG/3ML) 0.083% IN NEBU
2.5000 mg | INHALATION_SOLUTION | Freq: Once | RESPIRATORY_TRACT | Status: AC
  Administered 2015-10-03: 2.5 mg via RESPIRATORY_TRACT
  Filled 2015-10-03: qty 3

## 2015-10-03 MED ORDER — FOLIC ACID 1 MG PO TABS
1.0000 mg | ORAL_TABLET | Freq: Every day | ORAL | Status: DC
  Administered 2015-10-03 – 2015-10-07 (×4): 1 mg via ORAL
  Filled 2015-10-03 (×5): qty 1

## 2015-10-03 MED ORDER — DEXAMETHASONE 2 MG PO TABS
4.0000 mg | ORAL_TABLET | Freq: Two times a day (BID) | ORAL | Status: DC
  Administered 2015-10-03 – 2015-10-07 (×7): 4 mg via ORAL
  Filled 2015-10-03 (×8): qty 2

## 2015-10-03 MED ORDER — IPRATROPIUM BROMIDE 0.02 % IN SOLN
0.5000 mg | RESPIRATORY_TRACT | Status: DC
  Administered 2015-10-03 – 2015-10-05 (×10): 0.5 mg via RESPIRATORY_TRACT
  Filled 2015-10-03 (×10): qty 2.5

## 2015-10-03 MED ORDER — VANCOMYCIN HCL IN DEXTROSE 1-5 GM/200ML-% IV SOLN
1000.0000 mg | Freq: Two times a day (BID) | INTRAVENOUS | Status: DC
  Administered 2015-10-04 – 2015-10-05 (×4): 1000 mg via INTRAVENOUS
  Filled 2015-10-03 (×3): qty 200

## 2015-10-03 MED ORDER — BUPROPION HCL ER (SR) 150 MG PO TB12
150.0000 mg | ORAL_TABLET | Freq: Two times a day (BID) | ORAL | Status: DC
  Administered 2015-10-03 – 2015-10-07 (×7): 150 mg via ORAL
  Filled 2015-10-03 (×8): qty 1

## 2015-10-03 MED ORDER — LORAZEPAM 2 MG/ML IJ SOLN: 0.0000 mg | Freq: Two times a day (BID) | INTRAMUSCULAR | Status: DC

## 2015-10-03 MED ORDER — SODIUM CHLORIDE 1 G PO TABS
2.0000 g | ORAL_TABLET | Freq: Three times a day (TID) | ORAL | Status: DC
  Administered 2015-10-04 – 2015-10-08 (×9): 2 g via ORAL
  Filled 2015-10-03 (×16): qty 2

## 2015-10-03 MED ORDER — WHITE PETROLATUM GEL
1.0000 "application " | Freq: Every day | Status: DC | PRN
  Filled 2015-10-03: qty 28.35

## 2015-10-03 MED ORDER — VITAMIN B-1 100 MG PO TABS
100.0000 mg | ORAL_TABLET | Freq: Every day | ORAL | Status: DC
  Administered 2015-10-03 – 2015-10-05 (×3): 100 mg via ORAL
  Filled 2015-10-03 (×4): qty 1

## 2015-10-03 MED ORDER — NICOTINE 14 MG/24HR TD PT24
14.0000 mg | MEDICATED_PATCH | Freq: Every day | TRANSDERMAL | Status: DC
  Administered 2015-10-03 – 2015-10-07 (×5): 14 mg via TRANSDERMAL
  Filled 2015-10-03 (×5): qty 1

## 2015-10-03 NOTE — Progress Notes (Signed)
ANTIBIOTIC CONSULT NOTE - INITIAL  Pharmacy Consult for vancomycin Indication: rule out pneumonia  Allergies  Allergen Reactions  . Varenicline     Chantix - caused strange behavior  . Isothiazolinone Chloride Rash  . Oxycodone Rash  . Quaternium-15 Rash    Patient Measurements:   Adjusted Body Weight:   Vital Signs: Temp: 97.4 F (36.3 C) (12/04 1705) Temp Source: Oral (12/04 1705) BP: 125/68 mmHg (12/04 1828) Pulse Rate: 111 (12/04 1828) Intake/Output from previous day:   Intake/Output from this shift:    Labs:  Recent Labs  10/03/15 1801  WBC 2.2*  HGB 12.1*  PLT 131*   Estimated Creatinine Clearance: 89.1 mL/min (by C-G formula based on Cr of 0.8). No results for input(s): VANCOTROUGH, VANCOPEAK, VANCORANDOM, GENTTROUGH, GENTPEAK, GENTRANDOM, TOBRATROUGH, TOBRAPEAK, TOBRARND, AMIKACINPEAK, AMIKACINTROU, AMIKACIN in the last 72 hours.   Microbiology: Recent Results (from the past 720 hour(s))  Urine culture     Status: None   Collection Time: 09/06/15  8:17 AM  Result Value Ref Range Status   Specimen Description URINE, CLEAN CATCH  Final   Special Requests NONE  Final   Culture   Final    NO GROWTH 1 DAY Performed at [facility]    Report Status 09/07/2015 FINAL  Final  TECHNOLOGIST REVIEW     Status: None   Collection Time: 09/28/15  8:36 AM  Result Value Ref Range Status   Technologist Review rare myelocyte present  Final    Medical History: Past Medical History  Diagnosis Date  . Hypertension     under control, has been on med. x 15-20 yrs.  [name] GERD (gastroesophageal reflux disease)   . Infection of elbow 2011    right-no infection at present , but has skin lesions present  . Sarcoidosis ([place])     "tends to have skin flareups" and presently experiencing now"feet, legs and arms"  . Chronic hyponatremia     Dr. Buddy Duty follows  . Radiation 02/11/15-02/25/15    brain 25 Gy  . DNR no code (do not resuscitate) 08/02/2015  . Lung cancer  ([place])   . Brain metastases ([place])    Assessment: 43 YOM presents with shortness of breath and hypoxia. Has history of lung cancer with brain mets on chemo.  Possible pneumonia per CXR.  Lactic acid elevated. Pharmacy asked to dose vancomycin and zosyn dosed per MD  12/4 >> vanco >> 12/4 >> pip/tazo  >>    / blood:  Renal: SCr WNL WBC decreased s/p carboplatin 12/1  Dose changes/levels:  Goal of Therapy:  Vancomycin trough level 15-20 mcg/ml  Plan:   Vancomycin 1gm in ED then 1gm IV q12h  Check vancomycin trough  Change zosyn to 3.375gm IV q8h over 4h infusion (instead of 93mn infusion)  Monitor renal function  ZClovis Riley12/01/2015,6:43 PM

## 2015-10-03 NOTE — Progress Notes (Signed)
Patient's wife was notified of patient's increase need of oxygen, per patient's nurse he was moved from [place] to a non-rebreather mask. The wife was informed that if his respiratory status did not improve with the non-rebreather that the next step would be to try him on a BIPAP machine. Wife was agreeable to this plan, saying "I know he is a DNR but I'm not ready for him to die now" of note, his wife is his healthcare POA. Wife asked if he could come and stay at patient's bedside, she was crying at this time, this RN encouraged her to come stay with patient, as this might be comforting for her.

## 2015-10-03 NOTE — ED Notes (Signed)
His wife is with him and tells [place] pt. Underwent first round of chemo. tx for lung cancer Thurs. (three days ago); and that pt. Has had cough with gradually increasing shortness of breath ever since.  He arrives in no distress.  His skin is pale, warm and dry and he is mildly short of breath.

## 2015-10-03 NOTE — ED Notes (Signed)
Nurse drawing labs. 

## 2015-10-03 NOTE — H&P (Addendum)
Spencer Long History and Physical  Spencer Long Spencer Long:[id number] DOB: 06-23-1951 DOA: 10/03/2015  Referring physician: ED physician PCP: Spencer Austria, MD  Specialists:   Chief Complaint: SOB and cough.  HPI: Spencer Long is a 64 y.o. male with PMH of small cell lung cancer with brain metastasis (s/p of stereotactic radiation therapy, currently on chemotherapy), chronic hyponatremia on salt tablets, HTN, sarcoidosis, gerd, depression, former smoker, COPD, who presents with cough and shortness of breath.  Pt states he had 6 cycles of chemotherapy last year, because of recurrence lung cancer, he is currently receiving chemotherapy. Patient reports that he had chemotherapy on 09/30/15. After that, he started having SOB and cough, which have been progressively getting worse. He coughs up whitish colored mucus. He does not have fever or chills. He does not have chest pain at resting condition, but his coughing induces some chest pain. No tenderness over calf areas. Patient does not have abdominal pain, diarrhea, symptoms of UTI or unilateral weakness.   In ED, patient was found to have pancytopenia with WBC 2.2, hemoglobin 12.1, platelets 131, lactate 2.10, troponin negative, BNP 69.8, temperature normal, tachycardia, tachypnea, oxygen desaturated to 84% on room air. Chest x-ray showed patchy right basilar infiltration.  Where does patient live?   At home   Can patient participate in ADLs? Little  Review of Systems:   General: no fevers, chills, no changes in body weight, has poor appetite, has fatigue HEENT: no blurry vision, hearing changes or sore throat Pulm: has dyspnea, coughing, no wheezing CV: has chest pain, no palpitations Abd: no nausea, vomiting, abdominal pain, diarrhea, constipation GU: no dysuria, burning on urination, increased urinary frequency, hematuria  Ext: no leg edema Neuro: no unilateral weakness, numbness, or tingling, no vision change or hearing  loss Skin: no rash MSK: No muscle spasm, no deformity, no limitation of range of movement in spin Heme: No easy bruising.  Travel history: No recent long distant travel.  Allergy:  Allergies  Allergen Reactions  . Varenicline     Chantix - caused strange behavior  . Isothiazolinone Chloride Rash  . Oxycodone Rash  . Quaternium-15 Rash    Past Medical History  Diagnosis Date  . Hypertension     under control, has been on med. x 15-20 yrs.  [name] GERD (gastroesophageal reflux disease)   . Infection of elbow 2011    right-no infection at present , but has skin lesions present  . Sarcoidosis ([place])     "tends to have skin flareups" and presently experiencing now"feet, legs and arms"  . Chronic hyponatremia     Dr. Buddy Long follows  . Radiation 02/11/15-02/25/15    brain 25 Gy  . DNR no code (do not resuscitate) 08/02/2015  . Lung cancer ([place])   . Brain metastases ([place])   . COPD (chronic obstructive pulmonary disease) ([place])   . Depression     Past Surgical History  Procedure Laterality Date  . Cholecystectomy  05/2006  . Ulnar nerve repair  01/2008    right; decompression  . Carpal tunnel release  01/2008    right  . Eye surgery  12/2008    correction ectropion, release band, wedge exc., shortening  . I&d extremity  09/12/2011    Procedure: IRRIGATION AND DEBRIDEMENT EXTREMITY;  Surgeon: Tennis Must;  Location: [facility];  Service: Orthopedics;  Laterality: Right;  I&d RIGHT OLECRANON BURSSA   . Tonsillectomy    . Endobronchial ultrasound Bilateral 08/24/2014    Procedure: ENDOBRONCHIAL  ULTRASOUND;  Surgeon: Collene Gobble, MD;  Location: [facility];  Service: Cardiopulmonary;  Laterality: Bilateral;    Social History:  reports that he has quit smoking. His smoking use included Cigarettes. He has a 60 pack-year smoking history. He quit smokeless tobacco use about 13 months ago. He reports that he drinks about 8.4 oz of alcohol per week. He reports that he does not  use illicit drugs.  Family History:  Family History  Problem Relation Age of Onset  . Cancer Father     Leukemia  . Cancer Mother     breast  . Dementia Mother      Prior to Admission medications   Medication Sig Start Date End Date Taking? Authorizing Provider  Albuterol Sulfate (PROAIR RESPICLICK) 557 (90 BASE) MCG/ACT AEPB Inhale 2 puffs into the lungs every 4 (four) hours as needed. 05/21/15   Collene Gobble, MD  amLODipine (NORVASC) 10 MG tablet Take 10 mg by mouth every morning.  07/23/14   Historical Provider, MD  aspirin EC 81 MG tablet Take 81 mg by mouth every morning.     Historical Provider, MD  atenolol (TENORMIN) 25 MG tablet Take 25 mg by mouth 2 (two) times daily.    Historical Provider, MD  buPROPion (WELLBUTRIN SR) 150 MG 12 hr tablet Take 1 tablet (150 mg total) by mouth 2 (two) times daily. 08/24/15   Collene Gobble, MD  dexamethasone (DECADRON) 4 MG tablet Take 1 tablet (4 mg total) by mouth 4 (four) times daily. 09/15/15   Florencia Reasons, MD  EDARBI 80 MG TABS Take 80 mg by mouth daily. 08/13/15   Historical Provider, MD  feeding supplement, ENSURE ENLIVE, (ENSURE ENLIVE) LIQD Take 237 mLs by mouth 2 (two) times daily between meals. 09/15/15   Florencia Reasons, MD  furosemide (LASIX) 20 MG tablet Take 1 tablet (20 mg total) by mouth daily. 09/15/15   Florencia Reasons, MD  lidocaine-prilocaine (EMLA) cream Apply 1 application topically as needed. 09/14/15   Curt Bears, MD  Multiple Vitamins-Minerals (MULTIVITAMIN ADULTS 50+ PO) Take 1 tablet by mouth every morning.     Historical Provider, MD  omeprazole (PRILOSEC) 40 MG capsule Take 40 mg by mouth every morning.    Historical Provider, MD  Probiotic Product (PROBIOTIC DAILY PO) Take 1 tablet by mouth every morning.     Historical Provider, MD  prochlorperazine (COMPAZINE) 10 MG tablet Take 1 tablet (10 mg total) by mouth every 6 (six) hours as needed for nausea or vomiting. 08/17/15   Curt Bears, MD  sodium chloride 1 G tablet Take 2  tablets (2 g total) by mouth 3 (three) times daily with meals. 09/15/15   Florencia Reasons, MD  Tiotropium Bromide Monohydrate (SPIRIVA RESPIMAT) 1.25 MCG/ACT AERS Inhale 2 puffs into the lungs daily.    Historical Provider, MD  white petrolatum (VASELINE) GEL Apply 1 application topically daily as needed for lip care or dry skin.    Historical Provider, MD    Physical Exam: Filed Vitals:   10/03/15 1705 10/03/15 1809 10/03/15 1828  BP:   125/68  Pulse: 115  111  Temp: 97.4 F (36.3 C)    TempSrc: Oral    Resp: 20  26  SpO2: 84% 95% 91%   General: Not in acute distress HEENT:       Eyes: PERRL, EOMI, no scleral icterus.       ENT: No discharge from the ears and nose, no pharynx injection, no tonsillar enlargement.  Neck: No JVD, no bruit, no mass felt. Heme: No neck lymph node enlargement. Cardiac: S1/S2, RRR, tachycardia, No murmurs, No gallops or rubs. Pulm: Diffused rhonchi bilaterally, No rales or rubs. Abd: Soft, nondistended, nontender, no rebound pain, no organomegaly, BS present. Ext: No pitting leg edema bilaterally. 2+DP/PT pulse bilaterally. Musculoskeletal: No joint deformities, No joint redness or warmth, no limitation of ROM in spin. Skin: No rashes.  Neuro: Alert, oriented X3, cranial nerves II-XII grossly intact, muscle strength 5/5 in all extremities, sensation to light touch intact.  Psych: Patient is not psychotic, no suicidal or hemocidal ideation.  Labs on Admission:  Basic Metabolic Panel:  Recent Labs Lab 09/28/15 0836 10/03/15 1801  NA 137 134*  K 4.5 4.5  CL  --  101  CO2 24 24  GLUCOSE 87 131*  BUN 40.8* 38*  CREATININE 0.8 0.75  CALCIUM 8.5 8.0*  MG 2.5  --    Liver Function Tests:  Recent Labs Lab 09/28/15 0836  AST 14  ALT 27  ALKPHOS 59  BILITOT 0.78  PROT 5.7*  ALBUMIN 3.3*   No results for input(s): LIPASE, AMYLASE in the last 168 hours. No results for input(s): AMMONIA in the last 168 hours. CBC:  Recent Labs Lab  09/28/15 0836 10/03/15 1801  WBC 16.1* 2.2*  NEUTROABS 14.5*  --   HGB 13.4 12.1*  HCT 40.3 34.8*  MCV 93.5 91.3  PLT 209 131*   Cardiac Enzymes: No results for input(s): CKTOTAL, CKMB, CKMBINDEX, TROPONINI in the last 168 hours.  BNP (last 3 results)  Recent Labs  10/03/15 1846  BNP 69.8    ProBNP (last 3 results) No results for input(s): PROBNP in the last 8760 hours.  CBG: No results for input(s): GLUCAP in the last 168 hours.  Radiological Exams on Admission: Dg Chest Port 1 View  10/03/2015  CLINICAL DATA:  Shortness of breath.  Lung cancer. EXAM: PORTABLE CHEST 1 VIEW COMPARISON:  09/06/2015 chest radiograph. FINDINGS: Right internal jugular MediPort terminates in the lower third of the superior Spencer cava. Stable cardiomediastinal silhouette with normal heart size. No pneumothorax. No pleural effusion. New patchy opacities are present at both lung bases. IMPRESSION: New patchy bibasilar lung opacities, favor aspiration or pneumonia. A component of tumor progression cannot be excluded. Electronically Signed   By: Ilona Sorrel M.D.   On: 10/03/2015 17:59    EKG: Independently reviewed. QTC 436, baseline wandering, tachycardia   Assessment/Plan Principal Problem:   HCAP (healthcare-associated pneumonia) Active Problems:   Small cell lung cancer ([place])   Sarcoidosis ([place])   COPD (chronic obstructive pulmonary disease) ([place])   Brain metastasis (HCC)   Depression   Essential hypertension   Palliative care encounter   Acute on chronic respiratory failure with hypoxia (HCC)   Sepsis (HCC)   Pancytopenia (HCC)  Acute on chronic respiratory failure with hypoxia (HCC) due to HCAP (healthcare-associated pneumonia and sepsis: Patient's productive cough, shortness of breath and x-ray findings are consistent with HCAP. Since patient has neutropenia, he is at risk of deteriorating. He is septic with tachycardia, tachypnea, neutropenia, and elevated lactate. Currently  hemodynamically stable.  - Will admit to SDU - IV Vancomycin and Zosyn - Mucinex for cough  - Atrovent and Xopenex Neb prn for SOB - Urine legionella and S. pneumococcal antigen - Follow up blood culture x2, sputum culture, plus Flu pcr - will get Procalcitonin and trend lactic acid level per sepsis protocol - IVF: 2.5L of NS bolus in ED, followed by  75 mL per hour of NS   COPD (chronic obstructive pulmonary disease) ([place]): Patient has diffused rhonchi bilaterally, indicating possible exacerbation. -See above  Small cell lung cancer ([place]) with brain metastases: Diagnosed 07/2014. S/p of stereotactic radiation therapy last year. Pt had 6 cycles of chemotherapy last year, because of recurrence, he is currently receiving chemotherapy. He is being treated by Dr. Evette Cristal. He had chemotherapy on 09/30/15. -follow up with Dr.  [name]. -taking Decadron for brain metastasis.  -will give solumedrol 60 mg daily as stress dose of steroid and also will help COPD exacerbation  Sarcoidosis [facility]): Calcium 8.0 today.  -breathing treatment as above.  HTN: -Hold amlodipine and Edarbi since patient is at risk of developing hypotension due to sepsis -IV hydralazine when necessary -continue atenolol  Pancytopenia ([place]): due to chemotherapy. -Follow up by CBC  Depression: Stable, no suicidal or homicidal ideations. -Continue home medications: Wellbutrin  GERD: -Protonix  Wandering EKG Baseline: Most like due to artificial effect. Pericardial effusion is a potential differential diagnosis. -will repeat EKG in AM -Observe closely for any signs of pericardial tamponade  Tobacco abuse and Alcohol abuse: -Did counseling about importance of quitting smoking -Nicotine patch -Did counseling about the importance of quitting drinking -CIWA protocol  DVT ppx: SQ Heparin     Code Status: DNR Family Communication:  Yes, patient's wife at bed side Disposition Plan: Admit to inpatient   Date of Service  10/03/2015    Ivor Costa Spencer Long Pager [phone]  If 7PM-7AM, please contact night-coverage [email or web address] Password [email or web address] 10/03/2015, 8:41 PM

## 2015-10-03 NOTE — ED Provider Notes (Signed)
CSN: [id number]     Arrival date & time 10/03/15  1653 History   First MD Initiated Contact with Patient 10/03/15 1710     Chief Complaint  Patient presents with  . Shortness of Breath     (Consider location/radiation/quality/duration/timing/severity/associated sxs/prior Treatment) Patient is a 64 y.o. male presenting with shortness of breath.  Shortness of Breath    GERSON FAUTH is a 64 y.o. male with PMH significant for COPD, HTN, GERD, small cell lung cancer with brain metastasis, sarcoidosis who presents with constant, progressively worsening, severe non-bloody productive cough x 4 days.  No aggravating factors.  Tried cold and hot beverages and foods and found no relief.  Associated symptoms include SOB, myalgias, and sore throat.  Denies CP, fever, chills, wheezing, rhinorrhea, otalgia, N/V, abdominal pain.  Endorses adequate PO intake and good appetite.    Past Medical History  Diagnosis Date  . Hypertension     under control, has been on med. x 15-20 yrs.  [name] GERD (gastroesophageal reflux disease)   . Infection of elbow 2011    right-no infection at present , but has skin lesions present  . Sarcoidosis ([place])     "tends to have skin flareups" and presently experiencing now"feet, legs and arms"  . Chronic hyponatremia     Dr. Buddy Duty follows  . Radiation 02/11/15-02/25/15    brain 25 Gy  . DNR no code (do not resuscitate) 08/02/2015  . Lung cancer ([place])   . Brain metastases ([place])   . COPD (chronic obstructive pulmonary disease) ([place])   . Depression    Past Surgical History  Procedure Laterality Date  . Cholecystectomy  05/2006  . Ulnar nerve repair  01/2008    right; decompression  . Carpal tunnel release  01/2008    right  . Eye surgery  12/2008    correction ectropion, release band, wedge exc., shortening  . I&d extremity  09/12/2011    Procedure: IRRIGATION AND DEBRIDEMENT EXTREMITY;  Surgeon: Tennis Must;  Location: [facility];  Service: Orthopedics;   Laterality: Right;  I&d RIGHT OLECRANON BURSSA   . Tonsillectomy    . Endobronchial ultrasound Bilateral 08/24/2014    Procedure: ENDOBRONCHIAL ULTRASOUND;  Surgeon: Collene Gobble, MD;  Location: [facility];  Service: Cardiopulmonary;  Laterality: Bilateral;   Family History  Problem Relation Age of Onset  . Cancer Father     Leukemia  . Cancer Mother     breast  . Dementia Mother    Social History  Substance Use Topics  . Smoking status: Former Smoker -- 1.50 packs/day for 40 years    Types: Cigarettes  . Smokeless tobacco: Former Systems developer    Quit date: 08/26/2014     Comment: currently using electronic cigarette and nicotine patch  . Alcohol Use: 8.4 oz/week    14 Standard drinks or equivalent per week     Comment: 2-4 drinks daily    Review of Systems  Respiratory: Positive for shortness of breath.    All other systems negative unless otherwise stated in HPI    Allergies  Varenicline; Isothiazolinone chloride; Oxycodone; and Quaternium-15  Home Medications   Prior to Admission medications   Medication Sig Start Date End Date Taking? Authorizing Provider  amLODipine (NORVASC) 10 MG tablet Take 10 mg by mouth every morning.  07/23/14  Yes Historical Provider, MD  atenolol (TENORMIN) 25 MG tablet Take 25 mg by mouth 2 (two) times daily.   Yes Historical Provider, MD  buPROPion (  WELLBUTRIN SR) 150 MG 12 hr tablet Take 1 tablet (150 mg total) by mouth 2 (two) times daily. 08/24/15  Yes Collene Gobble, MD  dexamethasone (DECADRON) 4 MG tablet Take 1 tablet (4 mg total) by mouth 4 (four) times daily. Patient taking differently: Take 4 mg by mouth 2 (two) times daily.  09/15/15  Yes Florencia Reasons, MD  EDARBI 80 MG TABS Take 80 mg by mouth daily. 08/13/15  Yes Historical Provider, MD  feeding supplement, ENSURE ENLIVE, (ENSURE ENLIVE) LIQD Take 237 mLs by mouth 2 (two) times daily between meals. Patient taking differently: Take 237 mLs by mouth daily.  09/15/15  Yes Florencia Reasons, MD   lidocaine-prilocaine (EMLA) cream Apply 1 application topically as needed. 09/14/15  Yes Curt Bears, MD  Multiple Vitamins-Minerals (MULTIVITAMIN ADULTS 50+ PO) Take 1 tablet by mouth every morning.    Yes Historical Provider, MD  nicotine (NICODERM CQ - DOSED IN MG/24 HOURS) 14 mg/24hr patch Place 14 mg onto the skin daily.   Yes Historical Provider, MD  omeprazole (PRILOSEC) 40 MG capsule Take 40 mg by mouth every morning.   Yes Historical Provider, MD  Probiotic Product (PROBIOTIC DAILY PO) Take 1 tablet by mouth every morning.    Yes Historical Provider, MD  prochlorperazine (COMPAZINE) 10 MG tablet Take 1 tablet (10 mg total) by mouth every 6 (six) hours as needed for nausea or vomiting. 08/17/15  Yes Curt Bears, MD  sodium chloride 1 G tablet Take 2 tablets (2 g total) by mouth 3 (three) times daily with meals. 09/15/15  Yes Florencia Reasons, MD  Tiotropium Bromide Monohydrate (SPIRIVA RESPIMAT) 1.25 MCG/ACT AERS Inhale 2 puffs into the lungs daily.   Yes Historical Provider, MD  white petrolatum (VASELINE) GEL Apply 1 application topically daily as needed for lip care or dry skin.   Yes Historical Provider, MD  Albuterol Sulfate (PROAIR RESPICLICK) 810 (90 BASE) MCG/ACT AEPB Inhale 2 puffs into the lungs every 4 (four) hours as needed. Patient not taking: Reported on 10/03/2015 05/21/15   Collene Gobble, MD  furosemide (LASIX) 20 MG tablet Take 1 tablet (20 mg total) by mouth daily. Patient not taking: Reported on 10/03/2015 09/15/15   Florencia Reasons, MD   BP 125/68 mmHg  Pulse 111  Temp(Src) 97.4 F (36.3 C) (Oral)  Resp 26  SpO2 91% Physical Exam  Constitutional: He is oriented to person, place, and time. He appears well-developed and well-nourished. He appears distressed (mildly).  HENT:  Head: Normocephalic and atraumatic.  Mouth/Throat: Oropharynx is clear and moist and mucous membranes are normal. No oropharyngeal exudate, posterior oropharyngeal edema, posterior oropharyngeal erythema  or tonsillar abscesses.  Postoropharyngeal white plaques consistent with thrush.  Eyes: Conjunctivae are normal. Pupils are equal, round, and reactive to light.  Neck: Normal range of motion. Neck supple.  Cardiovascular: Regular rhythm and normal heart sounds.  Tachycardia present.   No murmur heard. Pulmonary/Chest: Accessory muscle usage present. No stridor. Tachypnea noted. He is in respiratory distress. He has no decreased breath sounds. He has wheezes. He has no rhonchi. He has rales.  Abdominal: Soft. Bowel sounds are normal. He exhibits no distension. There is no tenderness. There is no rebound and no guarding.  Musculoskeletal: Normal range of motion.  Lymphadenopathy:    He has no cervical adenopathy.  Neurological: He is alert and oriented to person, place, and time.  Speech clear without dysarthria.  Skin: Skin is warm and dry.  Psychiatric: He has a normal mood and affect.  His behavior is normal.    ED Course  Procedures (including critical care time) Labs Review Labs Reviewed  CBC - Abnormal; Notable for the following:    WBC 2.2 (*)    RBC 3.81 (*)    Hemoglobin 12.1 (*)    HCT 34.8 (*)    Platelets 131 (*)    All other components within normal limits  BASIC METABOLIC PANEL - Abnormal; Notable for the following:    Sodium 134 (*)    Glucose, Bld 131 (*)    BUN 38 (*)    Calcium 8.0 (*)    All other components within normal limits  BLOOD GAS, ARTERIAL - Abnormal; Notable for the following:    pH, Arterial 7.472 (*)    pCO2 arterial 28.2 (*)    pO2, Arterial 58.1 (*)    All other components within normal limits  I-STAT CG4 LACTIC ACID, ED - Abnormal; Notable for the following:    Lactic Acid, Venous 2.10 (*)    All other components within normal limits  CULTURE, BLOOD (ROUTINE X 2)  CULTURE, BLOOD (ROUTINE X 2)  BRAIN NATRIURETIC PEPTIDE  I-STAT TROPOININ, ED    Imaging Review Dg Chest Port 1 View  10/03/2015  CLINICAL DATA:  Shortness of breath.  Lung  cancer. EXAM: PORTABLE CHEST 1 VIEW COMPARISON:  09/06/2015 chest radiograph. FINDINGS: Right internal jugular MediPort terminates in the lower third of the superior vena cava. Stable cardiomediastinal silhouette with normal heart size. No pneumothorax. No pleural effusion. New patchy opacities are present at both lung bases. IMPRESSION: New patchy bibasilar lung opacities, favor aspiration or pneumonia. A component of tumor progression cannot be excluded. Electronically Signed   By: Ilona Sorrel M.D.   On: 10/03/2015 17:59   I have personally reviewed and evaluated these images and lab results as part of my medical decision-making.   EKG Interpretation   Date/Time:  Sunday October 03 2015 17:08:34 EST Ventricular Rate:  115 PR Interval:    QRS Duration: 79 QT Interval:  315 QTC Calculation: 436 R Axis:   74 Text Interpretation:  Sinus tachycardia Baseline wander `no definite acute  st/t changes compared to prior ecg Confirmed by Ashok Cordia  MD, Lennette Bihari ([id number])  on 10/03/2015 6:32:18 PM      MDM   Final diagnoses:  HCAP (healthcare-associated pneumonia)  Thrush    Patient with SCLC currently receiving IV chemo presents with worsening cough and hypoxia.  On arrival, oxygen saturation of 84% on RA along with tachycardia, HR 115.  He is normotensive and afebrile.  Patient placed on 2L [place] with improvement to 90s.  Will start breathing treatments and portable CXR.  On exam, tachycardic, lungs with wheezing, crackles, and labored breathing with accessory muscle use.  Postoropharyngeal white plaques consistent with thrush visualized. Will give fluconazole.  Abdomen soft and benign.  Will obtain ABG, CBC, BMP, troponin, EKG and give fluids.  Patient with mild improvement with breathing treatments.  ABG shows ph 7.472, pco2 28.2, po2 58.1.   CXR shows new patchy bibasilar lung opacities favoring aspiration or PNA.  Suspect PNA.  Will start VI vanc and zosyn and admit to medicine.  Oxygen saturation  has improved to 95% on 6L [place].  Lactic acid 2.1 CBC shows WBC 2.2, RBC 12.1, plt 131 BMP shows Cr 0.75  Case has been discussed with and seen by Dr. Ashok Cordia who agrees with the above plan for admission.     Gloriann Loan, PA-C 10/03/15 2002  Lajean Saver,  MD 10/04/15 1345

## 2015-10-03 NOTE — ED Notes (Addendum)
Lactic Acid= 2.1, MD [place] notified

## 2015-10-04 ENCOUNTER — Encounter (HOSPITAL_COMMUNITY): Payer: Self-pay | Admitting: *Deleted

## 2015-10-04 ENCOUNTER — Other Ambulatory Visit: Payer: Commercial Managed Care - HMO

## 2015-10-04 ENCOUNTER — Ambulatory Visit: Payer: Commercial Managed Care - HMO | Admitting: Oncology

## 2015-10-04 ENCOUNTER — Encounter: Payer: Self-pay | Admitting: *Deleted

## 2015-10-04 DIAGNOSIS — J449 Chronic obstructive pulmonary disease, unspecified: Secondary | ICD-10-CM

## 2015-10-04 DIAGNOSIS — F1010 Alcohol abuse, uncomplicated: Secondary | ICD-10-CM

## 2015-10-04 DIAGNOSIS — A419 Sepsis, unspecified organism: Principal | ICD-10-CM

## 2015-10-04 DIAGNOSIS — D869 Sarcoidosis, unspecified: Secondary | ICD-10-CM

## 2015-10-04 LAB — CBC WITH DIFFERENTIAL/PLATELET
BASOS PCT: 0 %
BASOS PCT: 0 %
Basophils Absolute: 0 10*3/uL (ref 0.0–0.1)
Basophils Absolute: 0 10*3/uL (ref 0.0–0.1)
EOS PCT: 0 %
EOS PCT: 0 %
Eosinophils Absolute: 0 10*3/uL (ref 0.0–0.7)
Eosinophils Absolute: 0 10*3/uL (ref 0.0–0.7)
HCT: 30.9 % — ABNORMAL LOW (ref 39.0–52.0)
HEMATOCRIT: 30.6 % — AB (ref 39.0–52.0)
HEMOGLOBIN: 10.8 g/dL — AB (ref 13.0–17.0)
Hemoglobin: 10.8 g/dL — ABNORMAL LOW (ref 13.0–17.0)
LYMPHS ABS: 0.1 10*3/uL — AB (ref 0.7–4.0)
LYMPHS PCT: 55 %
Lymphocytes Relative: 59 %
Lymphs Abs: 0.1 10*3/uL — ABNORMAL LOW (ref 0.7–4.0)
MCH: 31.6 pg (ref 26.0–34.0)
MCH: 30.8 pg (ref 26.0–34.0)
MCHC: 35.3 g/dL (ref 30.0–36.0)
MCHC: 35 g/dL (ref 30.0–36.0)
MCV: 89.5 fL (ref 78.0–100.0)
MCV: 88 fL (ref 78.0–100.0)
MONO ABS: 0 10*3/uL — AB (ref 0.1–1.0)
MONOS PCT: 5 %
Monocytes Absolute: 0 10*3/uL — ABNORMAL LOW (ref 0.1–1.0)
Monocytes Relative: 6 %
NEUTROS ABS: 0.1 10*3/uL — AB (ref 1.7–7.7)
Neutro Abs: 0.1 10*3/uL — ABNORMAL LOW (ref 1.7–7.7)
Neutrophils Relative %: 40 %
Neutrophils Relative %: 35 %
PLATELETS: 67 10*3/uL — AB (ref 150–400)
Platelets: 77 10*3/uL — ABNORMAL LOW (ref 150–400)
RBC: 3.42 MIL/uL — ABNORMAL LOW (ref 4.22–5.81)
RBC: 3.51 MIL/uL — ABNORMAL LOW (ref 4.22–5.81)
RDW: 14 % (ref 11.5–15.5)
RDW: 13.9 % (ref 11.5–15.5)
WBC: 0.2 10*3/uL — CL (ref 4.0–10.5)
WBC: 0.2 10*3/uL — CL (ref 4.0–10.5)

## 2015-10-04 LAB — MRSA PCR SCREENING: MRSA by PCR: NEGATIVE

## 2015-10-04 LAB — LACTIC ACID, PLASMA
Lactic Acid, Venous: 2.5 mmol/L (ref 0.5–2.0)
Lactic Acid, Venous: 2.8 mmol/L (ref 0.5–2.0)
Lactic Acid, Venous: 1.8 mmol/L (ref 0.5–2.0)

## 2015-10-04 LAB — INFLUENZA PANEL BY PCR (TYPE A & B)
H1N1FLUPCR: NOT DETECTED
Influenza A By PCR: NEGATIVE
Influenza B By PCR: NEGATIVE

## 2015-10-04 LAB — STREP PNEUMONIAE URINARY ANTIGEN: STREP PNEUMO URINARY ANTIGEN: NEGATIVE

## 2015-10-04 MED ORDER — TBO-FILGRASTIM 300 MCG/0.5ML ~~LOC~~ SOSY
300.0000 ug | PREFILLED_SYRINGE | Freq: Once | SUBCUTANEOUS | Status: AC
  Administered 2015-10-04: 300 ug via SUBCUTANEOUS
  Filled 2015-10-04: qty 0.5

## 2015-10-04 NOTE — Progress Notes (Signed)
CRITICAL VALUE ALERT  Critical value received:  Lactic 2.5  Date of notification:  10/02/15  Time of notification:  2245  Critical value read back:Yes.    Nurse who received alert:  Darrin Nipper, RN  MD notified (1st page):  [facility] on call   Time of first page:  2245

## 2015-10-04 NOTE — Progress Notes (Signed)
CRITICAL VALUE ALERT  Critical value received:  Lactic 2.8   Date of notification:  10/03/15  Time of notification:  9847  Critical value read back:Yes.    Nurse who received alert:  Darrin Nipper, RN  MD notified (1st page):  [facility] on call  Time of first page:  0425

## 2015-10-04 NOTE — Progress Notes (Signed)
PAtient on bedpan at this time. CPT postponed. RT will continue to monitor patient.

## 2015-10-04 NOTE — Progress Notes (Signed)
OT Cancellation Note  Patient Details Name: Spencer Long MRN: [id number] DOB: 1951-07-28   Cancelled Treatment:    Reason Eval/Treat Not Completed: Other (comment)  Pt in bed eating. Per PT note pt is 2 person A.  Will evaluate pt for ADL activity next day. Pt will likely need SNF rehab. Kari Baars, [place]  Payton Mccallum D 10/04/2015, 3:41 PM

## 2015-10-04 NOTE — Evaluation (Signed)
Physical Therapy Evaluation Patient Details Name: Spencer Long MRN: [id number] DOB: Nov 05, 1950 Today's Date: 10/04/2015   History of Present Illness  Spencer Long is an 64 y.o. male with a PMH of small cell lung cancer with brain metastasis,  and chronic hyponatremia  was brought to the hospital  10/03/15 with SOB. Receiving Chemo. was recently in SNF rehab and [place] to home.   Clinical Impression  Patient unable to provide info related to prior functional level. Wife not present to discuss DC paln and caregiver availability.. Patient requires 2 person assist for  Sitting and standing. On 6 liters of oxygen.  Patient will benefit from PT to address problems listed in note below.    Follow Up Recommendations SNF;Supervision/Assistance - 24 hour- uncertain, recently in rehab.    Equipment Recommendations  None recommended by PT    Recommendations for Other Services       Precautions / Restrictions Precautions Precautions: Fall Precaution Comments: impaired safety cognition/impulsive      Mobility  Bed Mobility Overal bed mobility: Needs Assistance Bed Mobility: Supine to Sit     Supine to sit: Min assist     General bed mobility comments: increased time esp with scooting self to EOB.  roccked multiple times and did not move and inch. Assist with trunk to scoot to edge.Repeat cueing to complete task.   Transfers   Equipment used: Rolling walker (2 wheeled) Transfers: Sit to/from Stand Sit to Stand: Mod assist;+2 safety/equipment         General transfer comment: cues for hand placement, steady assist to rise from bed and smultimodal cues to side step x 4 along the bed. Stood x 3 minutes, HR 110, sats > 90% on 6 liters.multiple episodes of coughing.   Ambulation/Gait                Stairs            Wheelchair Mobility    Modified Rankin (Stroke Patients Only)       Balance Overall balance assessment: Needs assistance Sitting-balance support:  Bilateral upper extremity supported;Feet supported Sitting balance-Leahy Scale: Fair     Standing balance support: Bilateral upper extremity supported;During functional activity Standing balance-Leahy Scale: Poor                               Pertinent Vitals/Pain Pain Assessment: No/denies pain    Home Living Family/patient expects to be discharged to:: Private residence Living Arrangements: Spouse/significant other Available Help at Discharge: Family Type of Home: House Home Access: Stairs to enter         Additional Comments: uncertain of DME, wife not present, patient unable to state    Prior Function           Comments: uncertain, patient not able to provide the info, wife absent.     Hand Dominance        Extremity/Trunk Assessment   Upper Extremity Assessment: Defer to OT evaluation           Lower Extremity Assessment: Generalized weakness      Cervical / Trunk Assessment: Kyphotic  Communication      Cognition Arousal/Alertness: Awake/alert Behavior During Therapy: Flat affect Overall Cognitive Status: No family/caregiver present to determine baseline cognitive functioning Area of Impairment: Following commands;Safety/judgement;Awareness;Attention   Current Attention Level: Alternating   Following Commands: Follows one step commands inconsistently       General Comments: required repeat  cueing, slow to respond    General Comments      Exercises        Assessment/Plan    PT Assessment Patient needs continued PT services  PT Diagnosis Difficulty walking;Generalized weakness;Altered mental status   PT Problem List Decreased strength;Decreased activity tolerance;Decreased balance;Decreased mobility;Decreased knowledge of use of DME  PT Treatment Interventions DME instruction;Gait training;Functional mobility training;Therapeutic activities;Patient/family education   PT Goals (Current goals can be found in the Care Plan  section) Acute Rehab PT Goals Patient Stated Goal: none stated, patient not able to process. PT Goal Formulation: Patient unable to participate in goal setting Time For Goal Achievement: 10/18/15 Potential to Achieve Goals: Fair    Frequency Min 3X/week   Barriers to discharge   unclear wife's ability to care for patient.     Co-evaluation               End of Session   Activity Tolerance: Treatment limited secondary to medical complications (Comment) (cough, ) Patient left: in bed;with call bell/phone within reach;with bed alarm set Nurse Communication: Mobility status         Time: 5102-5852 PT Time Calculation (min) (ACUTE ONLY): 22 min   Charges:   PT Evaluation $Initial PT Evaluation Tier I: 1 Procedure     PT G CodesClaretha Cooper 10/04/2015, 11:50 AM Tresa Endo PT [phone]

## 2015-10-04 NOTE — Progress Notes (Signed)
CRITICAL VALUE ALERT  Critical value received:  Lactic 2.5  Date of notification:  10/03/15  Time of notification:  0045  Critical value read back:Yes.    Nurse who received alert:  Darrin Nipper, RN  MD notified (1st page):  [facility] on call  Time of first page:  0045

## 2015-10-04 NOTE — Evaluation (Signed)
Clinical/Bedside Swallow Evaluation Patient Details  Name: Spencer Long MRN: [id number] Date of Birth: 1951-06-13  Today's Date: 10/04/2015 Time: SLP Start Time (ACUTE ONLY): 1884 SLP Stop Time (ACUTE ONLY): 1430 SLP Time Calculation (min) (ACUTE ONLY): 10 min  Past Medical History:  Past Medical History  Diagnosis Date  . Hypertension     under control, has been on med. x 15-20 yrs.  [name] GERD (gastroesophageal reflux disease)   . Infection of elbow 2011    right-no infection at present , but has skin lesions present  . Sarcoidosis ([place])     "tends to have skin flareups" and presently experiencing now"feet, legs and arms"  . Chronic hyponatremia     Dr. Buddy Duty follows  . Radiation 02/11/15-02/25/15    brain 25 Gy  . DNR no code (do not resuscitate) 08/02/2015  . Lung cancer ([place])   . Brain metastases ([place])   . COPD (chronic obstructive pulmonary disease) ([place])   . Depression    Past Surgical History:  Past Surgical History  Procedure Laterality Date  . Cholecystectomy  05/2006  . Ulnar nerve repair  01/2008    right; decompression  . Carpal tunnel release  01/2008    right  . Eye surgery  12/2008    correction ectropion, release band, wedge exc., shortening  . I&d extremity  09/12/2011    Procedure: IRRIGATION AND DEBRIDEMENT EXTREMITY;  Surgeon: Tennis Must;  Location: [facility];  Service: Orthopedics;  Laterality: Right;  I&d RIGHT OLECRANON BURSSA   . Tonsillectomy    . Endobronchial ultrasound Bilateral 08/24/2014    Procedure: ENDOBRONCHIAL ULTRASOUND;  Surgeon: Collene Gobble, MD;  [facility];  Service: Cardiopulmonary;  Laterality: Bilateral;   HPI:  Spencer Long is a 64 y.o. male with PMH of small cell lung cancer with brain metastasis (s/p of stereotactic radiation therapy, currently on chemotherapy), chronic hyponatremia on salt tablets, HTN, sarcoidosis, gerd, depression, former smoker, COPD, who presents with cough and shortness of  breath. Patient reports that he had chemotherapy on 09/30/15. After that, he started having SOB and cough, which have been progressively getting worse. He coughs up whitish colored mucus. Chest x-ray showed patchy right basilar infiltration.   Assessment / Plan / Recommendation Clinical Impression  Pt demonstrates indirect findings that could indicate possible silent aspiration. Hoarse wet vocal quality particularly concerning. Will f/u with MBS tomorrow for objective assessment of swallowing. Pt may continue current diet until then.     Aspiration Risk  Moderate aspiration risk    Diet Recommendation     Medication Administration: Whole meds with liquid    Other  Recommendations Oral Care Recommendations: Oral care BID   Follow up Recommendations  24 hour supervision/assistance    Frequency and Duration            Prognosis        Swallow Study   General HPI: Spencer Long is a 64 y.o. male with PMH of small cell lung cancer with brain metastasis (s/p of stereotactic radiation therapy, currently on chemotherapy), chronic hyponatremia on salt tablets, HTN, sarcoidosis, gerd, depression, former smoker, COPD, who presents with cough and shortness of breath. Patient reports that he had chemotherapy on 09/30/15. After that, he started having SOB and cough, which have been progressively getting worse. He coughs up whitish colored mucus. Chest x-ray showed patchy right basilar infiltration. Type of Study: Bedside Swallow Evaluation Previous Swallow Assessment: none Diet Prior to this Study: Regular;Thin liquids  Temperature Spikes Noted: No Respiratory Status: Nasal cannula History of Recent Intubation: No Behavior/Cognition: Alert;Cooperative;Confused Oral Cavity Assessment: Within Functional Limits Oral Care Completed by SLP: No Oral Cavity - Dentition: Adequate natural dentition Vision: Functional for self-feeding Self-Feeding Abilities: Able to feed self;Needs assist Patient  Positioning: Upright in bed Baseline Vocal Quality: Hoarse;Low vocal intensity;Wet Volitional Cough: Congested;Wet Volitional Swallow: Able to elicit    Oral/Motor/Sensory Function Overall Oral Motor/Sensory Function: Within functional limits   Ice Chips Ice chips: Not tested   Thin Liquid Thin Liquid: Impaired Presentation: Cup;Self Fed;Straw Pharyngeal  Phase Impairments: Cough - Delayed    Nectar Thick Nectar Thick Liquid: Not tested   Honey Thick Honey Thick Liquid: Not tested   Puree Puree: Impaired Presentation: Spoon Pharyngeal Phase Impairments: Wet Vocal Quality   Solid Solid: Not tested      Herbie Baltimore, MA CCC-SLP [phone]  Jonise Weightman, Katherene Ponto 10/04/2015,2:31 PM

## 2015-10-04 NOTE — Care Management Note (Signed)
Case Management Note  Patient Details  Name: SAMANYU TINNELL MRN: [id number] Date of Birth: 02-10-1951  Subjective/Objective:                hcap    Action/Plan:Date: October 03, 2015 Chart reviewed for concurrent status and case management needs. Will continue to follow patient for changes and needs: Velva Harman, RN, BSN, [place]   [phone]   Expected Discharge Date:                  Expected Discharge Plan:  Skilled Nursing Facility  In-House Referral:  Clinical Social Work  Discharge planning Services  CM Consult  Post Acute Care Choice:  NA Choice offered to:  NA  DME Arranged:    DME Agency:     HH Arranged:    [place] Agency:     Status of Service:  Completed, signed off  Medicare Important Message Given:    Date Medicare IM Given:    Medicare IM give by:    Date Additional Medicare IM Given:    Additional Medicare Important Message give by:     If discussed at [place] of Stay Meetings, dates discussed:    Additional Comments:  Leeroy Cha, RN 10/04/2015, 9:00 AM

## 2015-10-04 NOTE — Progress Notes (Signed)
Oncology Nurse Navigator Documentation  Oncology Nurse Navigator Flowsheets 10/04/2015  Navigator Encounter Type Other/I received a vm message from wife today.  She stated patient was in the hospital.  I came to [place] to speak to them.  I listened as she explained what was going on with the patient. I update scheduling and APP of patient being in the hospital.  I update wife on present schedule and ask that she call me if needed.  She was thankful for the visit.   Patient Visit Type Inpatient  Treatment Phase Treatment  Barriers/Navigation Needs Education  Education Other  Interventions Coordination of Care  Time Spent with Patient 30

## 2015-10-04 NOTE — Progress Notes (Addendum)
[facility]                                                                              Patient Demographics  Spencer Long, is a 64 y.o. male, DOB - [birth year], PZW:[id number]  Admit date - 10/03/2015   Admitting Physician Ivor Costa, MD  Outpatient Primary MD for the patient is [name], MD  LOS - 1   Chief Complaint  Patient presents with  . Shortness of Breath      HPI 10/03/2015 by Dr. Ivor Costa Spencer Long is a 64 y.o. male with PMH of small cell lung cancer with brain metastasis (s/p of stereotactic radiation therapy, currently on chemotherapy), chronic hyponatremia on salt tablets, HTN, sarcoidosis, gerd, depression, former smoker, COPD, who presents with cough and shortness of breath.  Pt states he had 6 cycles of chemotherapy last year, because of recurrence lung cancer, he is currently receiving chemotherapy. Patient reports that he had chemotherapy on 09/30/15. After that, he started having SOB and cough, which have been progressively getting worse. He coughs up whitish colored mucus. He does not have fever or chills. He does not have chest pain at resting condition, but his coughing induces some chest pain. No tenderness over calf areas. Patient does not have abdominal pain, diarrhea, symptoms of UTI or unilateral weakness.   In ED, patient was found to have pancytopenia with WBC 2.2, hemoglobin 12.1, platelets 131, lactate 2.10, troponin negative, BNP 69.8, temperature normal, tachycardia, tachypnea, oxygen desaturated to 84% on room air. Chest x-ray showed patchy right basilar infiltration.  Assessment & Plan   Sepsis secondary to HCAP -Upon admission, patient did have lactic acidosis with tachycardia, tachypnea -Lactic acid has improved, currently 1.8 -Chest x-ray:  -Continue IV vancomycin and Zosyn -Continue nebulizer treatments as needed, Mucinex -Influenza negative -Urine strep pneumonia and legionella antigens pending, blood cultures  pending, sputum culture pending  Acute on chronic rash for a failure with hypoxia -Secondary to pneumonia -Continue oxygenation to maintain saturations above 92%  Small cell lung cancer with brain metastasis -Patient also Dr. [name], oncology -Diagnosed in October 2015, s/p stereotactic radiation therapy, 6 cycles of chemotherapy -Last chemotherapy treatment was on 09/30/2015 -Continue Decadron for brain metastasis  Sarcoidosis -Calcium stable continue breathing treatments as needed  Essential hypertension -Amlodipine, Edarbi held -Continue atenolol, IV hydralazine as needed  Pancytopenia -Likely secondary to chemotherapy -Monitor CBC  Depression -Continue Wellbutrin  GERD -Continue PPI  Tobacco and alcohol abuse -Patient counseled, continue nicotine patch and CIWA protocol  Code Status: DNR   Family Communication: Wife at bedside  Disposition Plan: Admitted   Time Spent in minutes   30 minutes  Procedures  None  Consults   Dr. [name] made aware of patient's admission.  DVT Prophylaxis  heparin  Lab Results  Component Value Date   PLT 131* 10/03/2015    Medications  Scheduled Meds: . atenolol  25 mg Oral BID  . buPROPion  150 mg Oral BID  . dexamethasone  4 mg Oral BID  . dextromethorphan-guaiFENesin  1 tablet Oral BID  . feeding supplement (ENSURE ENLIVE)  237 mL Oral Daily  . FLORA-Q  1 capsule Oral Daily  .  folic acid  1 mg Oral Daily  . heparin  5,000 Units Subcutaneous 3 times per day  . ipratropium  0.5 mg Nebulization Q4H  . levalbuterol  0.63 mg Nebulization Q4H  . LORazepam  0-4 mg Intravenous Q6H   Followed by  . [START ON 10/05/2015] LORazepam  0-4 mg Intravenous Q12H  . methylPREDNISolone (SOLU-MEDROL) injection  60 mg Intravenous Daily  . multivitamin with minerals  1 tablet Oral Daily  . nicotine  14 mg Transdermal Daily  . pantoprazole  40 mg Oral Daily  . piperacillin-tazobactam (ZOSYN)  IV  3.375 g Intravenous 3 times per day    . sodium chloride  2 g Oral TID WC  . thiamine  100 mg Oral Daily   Or  . thiamine  100 mg Intravenous Daily  . vancomycin  1,000 mg Intravenous Q12H   Continuous Infusions: . sodium chloride 75 mL/hr at 10/03/15 2330   PRN Meds:.levalbuterol, lidocaine-prilocaine, LORazepam **OR** LORazepam, white petrolatum  Antibiotics    Anti-infectives    Start     Dose/Rate Route Frequency Ordered Stop   10/04/15 0600  vancomycin (VANCOCIN) IVPB 1000 mg/200 mL premix     1,000 mg 200 mL/hr over 60 Minutes Intravenous Every 12 hours 10/03/15 1857     10/04/15 0200  piperacillin-tazobactam (ZOSYN) IVPB 3.375 g     3.375 g 12.5 mL/hr over 240 Minutes Intravenous 3 times per day 10/03/15 1857     10/03/15 2200  piperacillin-tazobactam (ZOSYN) IVPB 3.375 g  Status:  Discontinued     3.375 g 100 mL/hr over 30 Minutes Intravenous 3 times per day 10/03/15 1826 10/03/15 1857   10/03/15 2200  fluconazole (DIFLUCAN) tablet 200 mg     200 mg Oral  Once 10/03/15 2000 10/03/15 2144   10/03/15 1830  vancomycin (VANCOCIN) IVPB 1000 mg/200 mL premix     1,000 mg 200 mL/hr over 60 Minutes Intravenous  Once 10/03/15 1826 10/03/15 1955        Subjective:   Spencer Long seen and examined today.  Patient resting comfortably.  No complaints this morning. Wife at bedside and states he is much improved since admission.  Objective:   Filed Vitals:   10/04/15 0700 10/04/15 0800 10/04/15 0815 10/04/15 0906  BP: 130/59 153/64  138/54  Pulse: 95 87  98  Temp:   98.4 F (36.9 C)   TempSrc:      Resp: 18   25  Height: '5\' 9"'$  (1.753 m)     Weight: 71 kg (156 lb 8.4 oz)     SpO2: 97%   96%    Wt Readings from Last 3 Encounters:  10/04/15 71 kg (156 lb 8.4 oz)  09/28/15 70.308 kg (155 lb)  09/21/15 68.856 kg (151 lb 12.8 oz)     Intake/Output Summary (Last 24 hours) at 10/04/15 1032 Last data filed at 10/04/15 0600  Gross per 24 hour  Intake  737.5 ml  Output   1950 ml  Net -1212.5 ml     Exam  General: Well developed, well nourished, NAD, appears stated age  HEENT: NCAT,  mucous membranes moist.   Cardiovascular: S1 S2 auscultated, no rubs, murmurs or gallops. Regular rate and rhythm.  Respiratory: Diminished breath sounds anteriorly, tachypneic  Abdomen: Soft, nontender, nondistended, + bowel sounds  Extremities: warm dry without cyanosis clubbing or edema  Neuro: AAOx2, nonfocal  Data Review   Micro Results Recent Results (from the past 240 hour(s))  TECHNOLOGIST REVIEW  Status: None   Collection Time: 09/28/15  8:36 AM  Result Value Ref Range Status   Technologist Review rare myelocyte present  Final  Blood Culture (routine x 2)     Status: None (Preliminary result)   Collection Time: 10/03/15  6:30 PM  Result Value Ref Range Status   Specimen Description BLOOD PORTA CATH  Final   Special Requests BOTTLES DRAWN AEROBIC AND ANAEROBIC 5CC  Final   Culture PENDING  Incomplete   Report Status PENDING  Incomplete  MRSA PCR Screening     Status: None   Collection Time: 10/03/15  9:14 PM  Result Value Ref Range Status   MRSA by PCR NEGATIVE NEGATIVE Final    Comment:        The GeneXpert MRSA Assay (FDA approved for NASAL specimens only), is one component of a comprehensive MRSA colonization surveillance program. It is not intended to diagnose MRSA infection nor to guide or monitor treatment for MRSA infections.     Radiology Reports Dg Chest 1 View  09/06/2015  CLINICAL DATA:  64 year old male with new altered mental status. Lung cancer with recently diagnosed metastatic disease to the brain. Subsequent encounter. EXAM: CHEST 1 VIEW COMPARISON:  PET-CT 08/16/2015, and earlier. FINDINGS: Right chest porta cath now in place. Stable cardiac size and mediastinal contours. Stable lung volumes. No pneumothorax, pulmonary edema, pleural effusion. Continued streaky retrocardiac opacity. Increased interstitial markings in both lungs may reflect vascular  congestion, no overt edema. IMPRESSION: Stable from recent PET-CT.  No new cardiopulmonary abnormality. Electronically Signed   By: [name] M.D.   On: 09/06/2015 08:30   Ct Head Wo Contrast  09/06/2015  CLINICAL DATA:  Altered mental status, history of small cell lung carcinoma with intracranial metastatic disease EXAM: CT HEAD WITHOUT CONTRAST TECHNIQUE: Contiguous axial images were obtained from the base of the skull through the vertex without intravenous contrast. COMPARISON:  08/25/2015 FINDINGS: Bony calvarium is intact. No gross soft tissue abnormality is noted. Areas a scatter white matter ischemic change are seen. The known metastatic lesions are not well appreciated on this exam. No significant white matter edema is noted. Motion artifact somewhat degrades the study. No acute hemorrhage is seen. IMPRESSION: No acute abnormality noted.  Mild chronic ischemic changes are seen. Electronically Signed   By: [name] M.D.   On: 09/06/2015 08:56   Dg Chest Port 1 View  10/03/2015  CLINICAL DATA:  Shortness of breath.  Lung cancer. EXAM: PORTABLE CHEST 1 VIEW COMPARISON:  09/06/2015 chest radiograph. FINDINGS: Right internal jugular MediPort terminates in the lower third of the superior vena cava. Stable cardiomediastinal silhouette with normal heart size. No pneumothorax. No pleural effusion. New patchy opacities are present at both lung bases. IMPRESSION: New patchy bibasilar lung opacities, favor aspiration or pneumonia. A component of tumor progression cannot be excluded. Electronically Signed   By: Ilona Sorrel M.D.   On: 10/03/2015 17:59    CBC  Recent Labs Lab 09/28/15 0836 10/03/15 1801  WBC 16.1* 2.2*  HGB 13.4 12.1*  HCT 40.3 34.8*  PLT 209 131*  MCV 93.5 91.3  MCH 31.1 31.8  MCHC 33.2 34.8  RDW 14.6 14.4  LYMPHSABS 0.7*  --   MONOABS 0.9  --   EOSABS 0.0  --   BASOSABS 0.0  --     Chemistries   Recent Labs Lab 09/28/15 0836 10/03/15 1801  NA 137 134*  K 4.5 4.5    CL  --  101  CO2  24 24  GLUCOSE 87 131*  BUN 40.8* 38*  CREATININE 0.8 0.75  CALCIUM 8.5 8.0*  MG 2.5  --   AST 14  --   ALT 27  --   ALKPHOS 59  --   BILITOT 0.78  --    ------------------------------------------------------------------------------------------------------------------ estimated creatinine clearance is 92.1 mL/min (by C-G formula based on Cr of 0.75). ------------------------------------------------------------------------------------------------------------------ No results for input(s): HGBA1C in the last 72 hours. ------------------------------------------------------------------------------------------------------------------ No results for input(s): CHOL, HDL, LDLCALC, TRIG, CHOLHDL, LDLDIRECT in the last 72 hours. ------------------------------------------------------------------------------------------------------------------ No results for input(s): TSH, T4TOTAL, T3FREE, THYROIDAB in the last 72 hours.  Invalid input(s): FREET3 ------------------------------------------------------------------------------------------------------------------ No results for input(s): VITAMINB12, FOLATE, FERRITIN, TIBC, IRON, RETICCTPCT in the last 72 hours.  Coagulation profile  Recent Labs Lab 10/03/15 2150  INR 1.02    No results for input(s): DDIMER in the last 72 hours.  Cardiac Enzymes No results for input(s): CKMB, TROPONINI, MYOGLOBIN in the last 168 hours.  Invalid input(s): CK ------------------------------------------------------------------------------------------------------------------ Invalid input(s): POCBNP    Rovena Hearld D.O. on 10/04/2015 at 10:32 AM  Between 7am to 7pm - Pager - [phone]  After 7pm go to [email or web address] - password [email or web address]  And look for the night coverage person covering for me after hours  [facility] Office  [phone]

## 2015-10-05 ENCOUNTER — Ambulatory Visit: Payer: Commercial Managed Care - HMO | Admitting: Emergency Medicine

## 2015-10-05 ENCOUNTER — Other Ambulatory Visit: Payer: Commercial Managed Care - HMO

## 2015-10-05 ENCOUNTER — Inpatient Hospital Stay (HOSPITAL_COMMUNITY): Payer: Commercial Managed Care - HMO

## 2015-10-05 DIAGNOSIS — Z515 Encounter for palliative care: Secondary | ICD-10-CM

## 2015-10-05 LAB — CBC WITH DIFFERENTIAL/PLATELET
BASOS PCT: 0 %
Basophils Absolute: 0 10*3/uL (ref 0.0–0.1)
EOS PCT: 0 %
Eosinophils Absolute: 0 10*3/uL (ref 0.0–0.7)
HEMATOCRIT: 28.6 % — AB (ref 39.0–52.0)
HEMOGLOBIN: 10.3 g/dL — AB (ref 13.0–17.0)
LYMPHS PCT: 57 %
Lymphs Abs: 0.1 10*3/uL — ABNORMAL LOW (ref 0.7–4.0)
MCH: 31.5 pg (ref 26.0–34.0)
MCHC: 36 g/dL (ref 30.0–36.0)
MCV: 87.5 fL (ref 78.0–100.0)
Monocytes Absolute: 0 10*3/uL — ABNORMAL LOW (ref 0.1–1.0)
Monocytes Relative: 6 %
NEUTROS ABS: 0.1 10*3/uL — AB (ref 1.7–7.7)
NEUTROS PCT: 37 %
PLATELETS: 64 10*3/uL — AB (ref 150–400)
RBC: 3.27 MIL/uL — ABNORMAL LOW (ref 4.22–5.81)
RDW: 13.6 % (ref 11.5–15.5)
WBC: 0.2 10*3/uL — CL (ref 4.0–10.5)

## 2015-10-05 LAB — BASIC METABOLIC PANEL
ANION GAP: 7 (ref 5–15)
BUN: 16 mg/dL (ref 6–20)
CALCIUM: 8.2 mg/dL — AB (ref 8.9–10.3)
CHLORIDE: 93 mmol/L — AB (ref 101–111)
CO2: 24 mmol/L (ref 22–32)
Creatinine, Ser: 0.48 mg/dL — ABNORMAL LOW (ref 0.61–1.24)
GFR calc Af Amer: >60 mL/min (ref >60)
GFR calc non Af Amer: >60 mL/min (ref >60)
GLUCOSE: 107 mg/dL — AB (ref 65–99)
Potassium: 3.7 mmol/L (ref 3.5–5.1)
Sodium: 124 mmol/L — ABNORMAL LOW (ref 135–145)

## 2015-10-05 LAB — TSH: TSH: 0.055 u[IU]/mL — AB (ref 0.350–4.500)

## 2015-10-05 MED ORDER — MORPHINE SULFATE (PF) 2 MG/ML IV SOLN
2.0000 mg | INTRAVENOUS | Status: AC
  Administered 2015-10-05: 2 mg via INTRAVENOUS
  Filled 2015-10-05: qty 1

## 2015-10-05 MED ORDER — IPRATROPIUM BROMIDE 0.02 % IN SOLN
0.5000 mg | Freq: Three times a day (TID) | RESPIRATORY_TRACT | Status: DC
  Administered 2015-10-05 – 2015-10-07 (×7): 0.5 mg via RESPIRATORY_TRACT
  Filled 2015-10-05 (×8): qty 2.5

## 2015-10-05 MED ORDER — FUROSEMIDE 10 MG/ML IJ SOLN
40.0000 mg | Freq: Once | INTRAMUSCULAR | Status: AC
  Administered 2015-10-05: 40 mg via INTRAVENOUS
  Filled 2015-10-05: qty 4

## 2015-10-05 MED ORDER — VITAMINS A & D EX OINT
TOPICAL_OINTMENT | CUTANEOUS | Status: AC
  Administered 2015-10-05: 21:00:00
  Filled 2015-10-05: qty 5

## 2015-10-05 MED ORDER — TBO-FILGRASTIM 300 MCG/0.5ML ~~LOC~~ SOSY
300.0000 ug | PREFILLED_SYRINGE | Freq: Once | SUBCUTANEOUS | Status: AC
  Administered 2015-10-05: 300 ug via SUBCUTANEOUS
  Filled 2015-10-05: qty 0.5

## 2015-10-05 MED ORDER — LEVALBUTEROL HCL 0.63 MG/3ML IN NEBU
0.6300 mg | INHALATION_SOLUTION | Freq: Three times a day (TID) | RESPIRATORY_TRACT | Status: DC
  Administered 2015-10-05 – 2015-10-07 (×7): 0.63 mg via RESPIRATORY_TRACT
  Filled 2015-10-05 (×8): qty 3

## 2015-10-05 NOTE — Consult Note (Signed)
Consultation Note Date: 10/05/2015   Patient Name: Spencer Long  DOB: [birth year]  MRN: [id number]  Age / Sex: 64 y.o., male  PCP: Maury Dus, MD Referring Physician: Cristal Ford, DO  Reason for Consultation: Establishing goals of care    Clinical Assessment/Narrative: I have met with Mr. Spencer Long and wife Spencer Long last admission for hyponatremia. Spencer Long is happy to report that he did very well in rehab and came home after ~ 1 week. His appetite was very good and she says he has been eating more that he ever has. She says they were doing well at home and he expressed that he would like to sleep in their bedroom upstairs so PT was planning to work with him to see if he could reach this goal. He has been sleeping downstairs and Spencer Long has baby monitor so he can call her if needed. She says that she heard him coughing all night prior to coming to the hospital and cough was becoming more productive.   Spencer Long is struggling as she says that she has been getting questions from her family and is confused about "when is enough enough." She wants him to have good quality of life but also is not ready to let him go. She is very realistic and knows that time is precious for him but feels like he had done so well since last admission and that this came on very suddenly. 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 She is very tearful and shares how all this also brings up memories of their son who lived on life support in NICU for 63 days until he was taken off life support and died in Spencer Long's arms. She says this is how they know that Spencer Long would not want to go through what their son went through -  DNR confirmed. Mostly therapeutic listening and emotional support provided today.  I also spoke with Spencer Long who is fairly short of breath and struggling to speak at times d/t SOB and  cough. Called RT for neb treatment. He tells me he has no further questions/concerns at this time. Asking when we believe he can go home. I will try and engage Spencer Long in further conversation about his fears as he has not spoken any more about this with Spencer Long but admitted to me last admission that he knew he was dying. I will continue to follow.   Contacts/Participants in Discussion: Primary Decision Maker: Self and Della Goo   Relationship to Patient wife  SUMMARY OF RECOMMENDATIONS - Treat pneumonia - Okay with BiPAP trial if needed - Follow with [facility] - Open to short term rehab again if indicated - Will follow for GOC/support but they are still hoping for more time  Code Status/Advance Care Planning: DNR    Code Status Orders        Start     Ordered   10/03/15 2009  Do not attempt resuscitation (DNR)   Continuous    Question Answer Comment  In the event of cardiac or respiratory ARREST Do not call a "code blue"   In the event of cardiac or respiratory ARREST Do not perform Intubation, CPR, defibrillation or ACLS   In the event of cardiac or respiratory ARREST Use medication by any route, position, wound care, and other measures to relive pain and suffering. May use oxygen, suction and manual treatment of airway obstruction as needed for comfort.      10/03/15 2010  Advance Directive Documentation        Most Recent Value   Type of Advance Directive  Healthcare Power of Attorney   Pre-existing out of facility DNR order (yellow form or pink MOST form)     "MOST" Form in Place?          Symptom Management:   Shortness of breath: Continue antibiotics. Titrate oxygen as indicated. Scheduled neb treatments. Flutter valve. Could consider low dose opioids with decline but needs pulmonary toilet right now to try and prevent further decline.   Cough: Scheduled guaifenesin.   Palliative Prophylaxis:   Aspiration, Bowel Regimen and Delirium  Protocol   Psycho-social/Spiritual:  Support System: Strong Desire for further Chaplaincy support:no Additional Recommendations: Caregiving  Support/Resources  Prognosis: Likely < 6 months but may want continued chemotherapy if he is able. Will discuss further.   Discharge Planning: To be determined.    Chief Complaint/ Primary Diagnoses: Present on Admission:  . Small cell lung cancer ([place]) . Sarcoidosis ([place]) . Essential hypertension . Depression . COPD (chronic obstructive pulmonary disease) ([place]) . Brain metastasis ([place]) . HCAP (healthcare-associated pneumonia) . Acute on chronic respiratory failure with hypoxia ([place]) . Sepsis ([place]) . Pancytopenia ([place]) . Alcohol abuse  I have reviewed the medical record, interviewed the patient and family, and examined the patient. The following aspects are pertinent.  Past Medical History  Diagnosis Date  . Hypertension     under control, has been on med. x 15-20 yrs.  [name] GERD (gastroesophageal reflux disease)   . Infection of elbow 2011    right-no infection at present , but has skin lesions present  . Sarcoidosis ([place])     "tends to have skin flareups" and presently experiencing now"feet, legs and arms"  . Chronic hyponatremia     Dr. Buddy Duty follows  . Radiation 02/11/15-02/25/15    brain 25 Gy  . DNR no code (do not resuscitate) 08/02/2015  . Lung cancer ([place])   . Brain metastases ([place])   . COPD (chronic obstructive pulmonary disease) ([place])   . Depression    Social History   Social History  . Marital Status: Married    Spouse Name: N/A  . Number of Children: 1  . Years of Education: N/A   Occupational History  . 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 Smoking status: Former Smoker -- 1.50 packs/day for 40 years    Types: Cigarettes  . Smokeless tobacco: Former Systems developer    Quit date: 08/26/2014     Comment: currently using electronic cigarette and nicotine patch  . Alcohol  Use: 8.4 oz/week    14 Standard drinks or equivalent per week     Comment: 2-4 drinks daily  . Drug Use: No  . Sexual Activity: Not Asked   Other Topics Concern  . None   Social History Narrative   Lives with wife.   Family History  Problem Relation Age of Onset  . Cancer Father     Leukemia  . Cancer Mother     breast  . Dementia Mother    Scheduled Meds: . atenolol  25 mg Oral BID  . buPROPion  150 mg Oral BID  . dexamethasone  4 mg Oral BID  . dextromethorphan-guaiFENesin  1 tablet Oral BID  . feeding supplement (ENSURE ENLIVE)  237 mL Oral Daily  . FLORA-Q  1 capsule Oral Daily  . folic acid  1 mg Oral Daily  . ipratropium  0.5  mg Nebulization TID  . levalbuterol  0.63 mg Nebulization TID  . LORazepam  0-4 mg Intravenous Q6H   Followed by  . LORazepam  0-4 mg Intravenous Q12H  . methylPREDNISolone (SOLU-MEDROL) injection  60 mg Intravenous Daily  . multivitamin with minerals  1 tablet Oral Daily  . nicotine  14 mg Transdermal Daily  . pantoprazole  40 mg Oral Daily  . piperacillin-tazobactam (ZOSYN)  IV  3.375 g Intravenous 3 times per day  . sodium chloride  2 g Oral TID WC  . Tbo-filgastrim (GRANIX) SQ  300 mcg Subcutaneous ONCE-1800  . thiamine  100 mg Oral Daily   Or  . thiamine  100 mg Intravenous Daily  . vancomycin  1,000 mg Intravenous Q12H   Continuous Infusions: . sodium chloride 75 mL/hr at 10/05/15 0338   PRN Meds:.levalbuterol, lidocaine-prilocaine, LORazepam **OR** LORazepam, white petrolatum Medications Prior to Admission:  Prior to Admission medications   Medication Sig Start Date End Date Taking? Authorizing Provider  amLODipine (NORVASC) 10 MG tablet Take 10 mg by mouth every morning.  07/23/14  Yes Historical Provider, MD  atenolol (TENORMIN) 25 MG tablet Take 25 mg by mouth 2 (two) times daily.   Yes Historical Provider, MD  buPROPion (WELLBUTRIN SR) 150 MG 12 hr tablet Take 1 tablet (150 mg total) by mouth 2 (two) times daily. 08/24/15   Yes Collene Gobble, MD  dexamethasone (DECADRON) 4 MG tablet Take 1 tablet (4 mg total) by mouth 4 (four) times daily. Patient taking differently: Take 4 mg by mouth 2 (two) times daily.  09/15/15  Yes Florencia Reasons, MD  EDARBI 80 MG TABS Take 80 mg by mouth daily. 08/13/15  Yes Historical Provider, MD  feeding supplement, ENSURE ENLIVE, (ENSURE ENLIVE) LIQD Take 237 mLs by mouth 2 (two) times daily between meals. Patient taking differently: Take 237 mLs by mouth daily.  09/15/15  Yes Florencia Reasons, MD  lidocaine-prilocaine (EMLA) cream Apply 1 application topically as needed. 09/14/15  Yes Curt Bears, MD  Multiple Vitamins-Minerals (MULTIVITAMIN ADULTS 50+ PO) Take 1 tablet by mouth every morning.    Yes Historical Provider, MD  nicotine (NICODERM CQ - DOSED IN MG/24 HOURS) 14 mg/24hr patch Place 14 mg onto the skin daily.   Yes Historical Provider, MD  omeprazole (PRILOSEC) 40 MG capsule Take 40 mg by mouth every morning.   Yes Historical Provider, MD  Probiotic Product (PROBIOTIC DAILY PO) Take 1 tablet by mouth every morning.    Yes Historical Provider, MD  prochlorperazine (COMPAZINE) 10 MG tablet Take 1 tablet (10 mg total) by mouth every 6 (six) hours as needed for nausea or vomiting. 08/17/15  Yes Curt Bears, MD  sodium chloride 1 G tablet Take 2 tablets (2 g total) by mouth 3 (three) times daily with meals. 09/15/15  Yes Florencia Reasons, MD  Tiotropium Bromide Monohydrate (SPIRIVA RESPIMAT) 1.25 MCG/ACT AERS Inhale 2 puffs into the lungs daily.   Yes Historical Provider, MD  white petrolatum (VASELINE) GEL Apply 1 application topically daily as needed for lip care or dry skin.   Yes Historical Provider, MD  Albuterol Sulfate (PROAIR RESPICLICK) 341 (90 BASE) MCG/ACT AEPB Inhale 2 puffs into the lungs every 4 (four) hours as needed. Patient not taking: Reported on 10/03/2015 05/21/15   Collene Gobble, MD  furosemide (LASIX) 20 MG tablet Take 1 tablet (20 mg total) by mouth daily. Patient not taking:  Reported on 10/03/2015 09/15/15   Florencia Reasons, MD   Allergies  Allergen  Reactions  . Varenicline     Chantix - caused strange behavior  . Isothiazolinone Chloride Rash  . Oxycodone Rash  . Quaternium-15 Rash    Review of Systems  Constitutional: Positive for fatigue. Negative for appetite change.  Respiratory: Positive for cough and shortness of breath.   Neurological: Positive for weakness.    Physical Exam  Constitutional: He is oriented to person, place, and time. He appears well-developed.  HENT:  Head: Normocephalic and atraumatic.  Cardiovascular: Normal rate.   Respiratory: Tachypnea noted. He is in respiratory distress. He has wheezes.  GI: Normal appearance.  Neurological: He is alert and oriented to person, place, and time.    Vital Signs: BP 153/70 mmHg  Pulse 105  Temp(Src) 97.7 F (36.5 C) (Oral)  Resp 30  Ht 5' 9"  (1.753 m)  Wt 74.1 kg (163 lb 5.8 oz)  BMI 24.11 kg/m2  SpO2 94%  SpO2: SpO2: 94 % O2 Device:SpO2: 94 % O2 Flow Rate: .O2 Flow Rate (L/min): 6 L/min  IO: Intake/output summary:  Intake/Output Summary (Last 24 hours) at 10/05/15 1818 Last data filed at 10/05/15 3557  Gross per 24 hour  Intake   1580 ml  Output   1900 ml  Net   -320 ml    LBM: Last BM Date: 10/05/15 Baseline Weight: Weight: 71 kg (156 lb 8.4 oz) Most recent weight: Weight: 74.1 kg (163 lb 5.8 oz)      Palliative Assessment/Data:    Additional Data Reviewed:  CBC:    Component Value Date/Time   WBC 0.2* 10/05/2015 1045   WBC 16.1* 09/28/2015 0836   HGB 10.3* 10/05/2015 1045   HGB 13.4 09/28/2015 0836   HCT 28.6* 10/05/2015 1045   HCT 40.3 09/28/2015 0836   PLT 64* 10/05/2015 1045   PLT 209 09/28/2015 0836   MCV 87.5 10/05/2015 1045   MCV 93.5 09/28/2015 0836   NEUTROABS 0.1* 10/05/2015 1045   NEUTROABS 14.5* 09/28/2015 0836   LYMPHSABS 0.1* 10/05/2015 1045   LYMPHSABS 0.7* 09/28/2015 0836   MONOABS 0.0* 10/05/2015 1045   MONOABS 0.9 09/28/2015 0836   EOSABS  0.0 10/05/2015 1045   EOSABS 0.0 09/28/2015 0836   BASOSABS 0.0 10/05/2015 1045   BASOSABS 0.0 09/28/2015 0836   Comprehensive Metabolic Panel:    Component Value Date/Time   NA 124* 10/05/2015 0520   NA 137 09/28/2015 0836   K 3.7 10/05/2015 0520   K 4.5 09/28/2015 0836   CL 93* 10/05/2015 0520   CO2 24 10/05/2015 0520   CO2 24 09/28/2015 0836   BUN 16 10/05/2015 0520   BUN 40.8* 09/28/2015 0836   CREATININE 0.48* 10/05/2015 0520   CREATININE 0.8 09/28/2015 0836   GLUCOSE 107* 10/05/2015 0520   GLUCOSE 87 09/28/2015 0836   CALCIUM 8.2* 10/05/2015 0520   CALCIUM 8.5 09/28/2015 0836   AST 14 09/28/2015 0836   AST 37 09/06/2015 0817   ALT 27 09/28/2015 0836   ALT 26 09/06/2015 0817   ALKPHOS 59 09/28/2015 0836   ALKPHOS 87 09/06/2015 0817   BILITOT 0.78 09/28/2015 0836   BILITOT 1.1 09/06/2015 0817   PROT 5.7* 09/28/2015 0836   PROT 6.7 09/06/2015 0817   ALBUMIN 3.3* 09/28/2015 0836   ALBUMIN 4.3 09/06/2015 0817     Time In: 1445 Time Out: 1600 Time Total: 14mn Greater than 50%  of this time was spent counseling and coordinating care related to the above assessment and plan.  Signed by: PPershing Proud NP  AElmo Putt  Zack Seal, NP  10/05/2015, 6:18 PM  Please contact Palliative Medicine Team phone at [phone] for questions and concerns.

## 2015-10-05 NOTE — Progress Notes (Signed)
Patient on bedpan and eating lunch. CPT held at this time.

## 2015-10-05 NOTE — Progress Notes (Addendum)
[facility]                                                                              Patient Demographics  Spencer Long, is a 64 y.o. male, DOB - 11-Feb-1951, QHU:[id number]  Admit date - 10/03/2015   Admitting Physician Ivor Costa, MD  Outpatient Primary MD for the patient is [name], MD  LOS - 2   Chief Complaint  Patient presents with  . Shortness of Breath      HPI 10/03/2015 by Dr. Ivor Costa Spencer Long is a 64 y.o. male with PMH of small cell lung cancer with brain metastasis (s/p of stereotactic radiation therapy, currently on chemotherapy), chronic hyponatremia on salt tablets, HTN, sarcoidosis, gerd, depression, former smoker, COPD, who presents with cough and shortness of breath.  Pt states he had 6 cycles of chemotherapy last year, because of recurrence lung cancer, he is currently receiving chemotherapy. Patient reports that he had chemotherapy on 09/30/15. After that, he started having SOB and cough, which have been progressively getting worse. He coughs up whitish colored mucus. He does not have fever or chills. He does not have chest pain at resting condition, but his coughing induces some chest pain. No tenderness over calf areas. Patient does not have abdominal pain, diarrhea, symptoms of UTI or unilateral weakness.   In ED, patient was found to have pancytopenia with WBC 2.2, hemoglobin 12.1, platelets 131, lactate 2.10, troponin negative, BNP 69.8, temperature normal, tachycardia, tachypnea, oxygen desaturated to 84% on room air. Chest x-ray showed patchy right basilar infiltration.  Assessment & Plan   Sepsis secondary to HCAP -Upon admission, patient did have lactic acidosis with tachycardia, tachypnea -Lactic acid has improved, currently 1.8 -Chest x-ray: New patchy bibasilar lung opacities, favor aspiration or pneumonia. -Continue IV vancomycin and Zosyn -Continue nebulizer treatments as needed, solumedrol, Mucinex -Influenza  negative -Urine strep pneumonia antigen negative -Legionella urine antigen pending, blood cultures pending, sputum culture pending  Acute on chronic respiratory failure with hypoxia -Secondary to pneumonia -Was able to be weaned off of nonrebreather and currently maintaining saturations above 92% -Continue oxygenation to maintain saturations above 92%  Small cell lung cancer with brain metastasis -Patient also Dr. [name], oncology -Diagnosed in October 2015, s/p stereotactic radiation therapy, 6 cycles of chemotherapy -Last chemotherapy treatment was on 09/30/2015 -Continue Decadron for brain metastasis  Sarcoidosis -Calcium stable continue breathing treatments as needed  Essential hypertension -Amlodipine, Edarbi held -Continue atenolol, IV hydralazine as needed  Pancytopenia -Likely secondary to chemotherapy -Spoke with Dr. [name], patient will be started on granix -Monitor CBC  Chronic hyponatremia -Continue sodium chloride tabs -Monitor BMP -obtain TSH  Depression -Continue Wellbutrin  GERD -Continue PPI  Tobacco and alcohol abuse -Patient counseled, continue nicotine patch and CIWA protocol  Goals of care -Patient is DO NOT RESUSCITATE. Wife was concerned about, "when is enough amount". -Will consult palliative care at the request of the patient's wife. To discuss symptom management and goals of care.  Code Status: DNR   Family Communication: Wife at bedside  Disposition Plan: Admitted   Time Spent in minutes   30 minutes  Procedures  None  Consults   Dr. [name], oncology,  via phone Palliative care  DVT Prophylaxis  heparin  Lab Results  Component Value Date   PLT 67* 10/04/2015    Medications  Scheduled Meds: . atenolol  25 mg Oral BID  . buPROPion  150 mg Oral BID  . dexamethasone  4 mg Oral BID  . dextromethorphan-guaiFENesin  1 tablet Oral BID  . feeding supplement (ENSURE ENLIVE)  237 mL Oral Daily  . FLORA-Q  1 capsule Oral  Daily  . folic acid  1 mg Oral Daily  . ipratropium  0.5 mg Nebulization Q4H  . levalbuterol  0.63 mg Nebulization Q4H  . LORazepam  0-4 mg Intravenous Q6H   Followed by  . LORazepam  0-4 mg Intravenous Q12H  . methylPREDNISolone (SOLU-MEDROL) injection  60 mg Intravenous Daily  . multivitamin with minerals  1 tablet Oral Daily  . nicotine  14 mg Transdermal Daily  . pantoprazole  40 mg Oral Daily  . piperacillin-tazobactam (ZOSYN)  IV  3.375 g Intravenous 3 times per day  . sodium chloride  2 g Oral TID WC  . thiamine  100 mg Oral Daily   Or  . thiamine  100 mg Intravenous Daily  . vancomycin  1,000 mg Intravenous Q12H   Continuous Infusions: . sodium chloride 75 mL/hr at 10/05/15 0338   PRN Meds:.levalbuterol, lidocaine-prilocaine, LORazepam **OR** LORazepam, white petrolatum  Antibiotics    Anti-infectives    Start     Dose/Rate Route Frequency Ordered Stop   10/04/15 0600  vancomycin (VANCOCIN) IVPB 1000 mg/200 mL premix     1,000 mg 200 mL/hr over 60 Minutes Intravenous Every 12 hours 10/03/15 1857     10/04/15 0200  piperacillin-tazobactam (ZOSYN) IVPB 3.375 g     3.375 g 12.5 mL/hr over 240 Minutes Intravenous 3 times per day 10/03/15 1857     10/03/15 2200  piperacillin-tazobactam (ZOSYN) IVPB 3.375 g  Status:  Discontinued     3.375 g 100 mL/hr over 30 Minutes Intravenous 3 times per day 10/03/15 1826 10/03/15 1857   10/03/15 2200  fluconazole (DIFLUCAN) tablet 200 mg     200 mg Oral  Once 10/03/15 2000 10/03/15 2144   10/03/15 1830  vancomycin (VANCOCIN) IVPB 1000 mg/200 mL premix     1,000 mg 200 mL/hr over 60 Minutes Intravenous  Once 10/03/15 1826 10/03/15 1955        Subjective:   Spencer Long seen and examined today.  Patient resting comfortably.  Patient feels his breathing has improved mildly. Denies any chest pain, abdominal pain, nausea or vomiting. Does feel tired.  Objective:   Filed Vitals:   10/05/15 7341 10/05/15 0625 10/05/15 0840  10/05/15 0931  BP:      Pulse: 99 102  95  Temp:   97.6 F (36.4 C)   TempSrc:   Oral   Resp: '23 29  22  '$ Height:      Weight:      SpO2: 90% 90%  95%    Wt Readings from Last 3 Encounters:  10/05/15 74.1 kg (163 lb 5.8 oz)  09/28/15 70.308 kg (155 lb)  09/21/15 68.856 kg (151 lb 12.8 oz)     Intake/Output Summary (Last 24 hours) at 10/05/15 0943 Last data filed at 10/05/15 9379  Gross per 24 hour  Intake 2922.5 ml  Output   1900 ml  Net 1022.5 ml    Exam  General: Well developed, well nourished, NAD  HEENT: NCAT,  mucous membranes moist.   Cardiovascular: S1 S2 auscultated,  RRR, no murmurs  Respiratory: Diminished.coarse breath sounds anteriorly  Abdomen: Soft, nontender, nondistended, + bowel sounds  Extremities: warm dry without cyanosis clubbing or edema  Neuro: AAOx3, nonfocal  Psych: Appropriate mood and affect  Data Review   Micro Results Recent Results (from the past 240 hour(s))  TECHNOLOGIST REVIEW     Status: None   Collection Time: 09/28/15  8:36 AM  Result Value Ref Range Status   Technologist Review rare myelocyte present  Final  Blood Culture (routine x 2)     Status: None (Preliminary result)   Collection Time: 10/03/15  6:30 PM  Result Value Ref Range Status   Specimen Description BLOOD PORTA CATH  Final   Special Requests BOTTLES DRAWN AEROBIC AND ANAEROBIC 5CC  Final   Culture PENDING  Incomplete   Report Status PENDING  Incomplete  MRSA PCR Screening     Status: None   Collection Time: 10/03/15  9:14 PM  Result Value Ref Range Status   MRSA by PCR NEGATIVE NEGATIVE Final    Comment:        The GeneXpert MRSA Assay (FDA approved for NASAL specimens only), is one component of a comprehensive MRSA colonization surveillance program. It is not intended to diagnose MRSA infection nor to guide or monitor treatment for MRSA infections.     Radiology Reports Dg Chest 1 View  09/06/2015  CLINICAL DATA:  64 year old male with new  altered mental status. Lung cancer with recently diagnosed metastatic disease to the brain. Subsequent encounter. EXAM: CHEST 1 VIEW COMPARISON:  PET-CT 08/16/2015, and earlier. FINDINGS: Right chest porta cath now in place. Stable cardiac size and mediastinal contours. Stable lung volumes. No pneumothorax, pulmonary edema, pleural effusion. Continued streaky retrocardiac opacity. Increased interstitial markings in both lungs may reflect vascular congestion, no overt edema. IMPRESSION: Stable from recent PET-CT.  No new cardiopulmonary abnormality. Electronically Signed   By: [name] M.D.   On: 09/06/2015 08:30   Ct Head Wo Contrast  09/06/2015  CLINICAL DATA:  Altered mental status, history of small cell lung carcinoma with intracranial metastatic disease EXAM: CT HEAD WITHOUT CONTRAST TECHNIQUE: Contiguous axial images were obtained from the base of the skull through the vertex without intravenous contrast. COMPARISON:  08/25/2015 FINDINGS: Bony calvarium is intact. No gross soft tissue abnormality is noted. Areas a scatter white matter ischemic change are seen. The known metastatic lesions are not well appreciated on this exam. No significant white matter edema is noted. Motion artifact somewhat degrades the study. No acute hemorrhage is seen. IMPRESSION: No acute abnormality noted.  Mild chronic ischemic changes are seen. Electronically Signed   By: [name] M.D.   On: 09/06/2015 08:56   Dg Chest Port 1 View  10/03/2015  CLINICAL DATA:  Shortness of breath.  Lung cancer. EXAM: PORTABLE CHEST 1 VIEW COMPARISON:  09/06/2015 chest radiograph. FINDINGS: Right internal jugular MediPort terminates in the lower third of the superior vena cava. Stable cardiomediastinal silhouette with normal heart size. No pneumothorax. No pleural effusion. New patchy opacities are present at both lung bases. IMPRESSION: New patchy bibasilar lung opacities, favor aspiration or pneumonia. A component of tumor progression cannot  be excluded. Electronically Signed   By: Ilona Sorrel M.D.   On: 10/03/2015 17:59    CBC  Recent Labs Lab 10/03/15 1801 10/04/15 1150 10/04/15 1253  WBC 2.2* 0.2* 0.2*  HGB 12.1* 10.8* 10.8*  HCT 34.8* 30.6* 30.9*  PLT 131* 77* 67*  MCV 91.3 89.5 88.0  MCH  31.8 31.6 30.8  MCHC 34.8 35.3 35.0  RDW 14.4 14.0 13.9  LYMPHSABS  --  0.1* 0.1*  MONOABS  --  0.0* 0.0*  EOSABS  --  0.0 0.0  BASOSABS  --  0.0 0.0    Chemistries   Recent Labs Lab 10/03/15 1801 10/05/15 0520  NA 134* 124*  K 4.5 3.7  CL 101 93*  CO2 24 24  GLUCOSE 131* 107*  BUN 38* 16  CREATININE 0.75 0.48*  CALCIUM 8.0* 8.2*   ------------------------------------------------------------------------------------------------------------------ estimated creatinine clearance is 92.1 mL/min (by C-G formula based on Cr of 0.48). ------------------------------------------------------------------------------------------------------------------ No results for input(s): HGBA1C in the last 72 hours. ------------------------------------------------------------------------------------------------------------------ No results for input(s): CHOL, HDL, LDLCALC, TRIG, CHOLHDL, LDLDIRECT in the last 72 hours. ------------------------------------------------------------------------------------------------------------------ No results for input(s): TSH, T4TOTAL, T3FREE, THYROIDAB in the last 72 hours.  Invalid input(s): FREET3 ------------------------------------------------------------------------------------------------------------------ No results for input(s): VITAMINB12, FOLATE, FERRITIN, TIBC, IRON, RETICCTPCT in the last 72 hours.  Coagulation profile  Recent Labs Lab 10/03/15 2150  INR 1.02    No results for input(s): DDIMER in the last 72 hours.  Cardiac Enzymes No results for input(s): CKMB, TROPONINI, MYOGLOBIN in the last 168 hours.  Invalid input(s):  CK ------------------------------------------------------------------------------------------------------------------ Invalid input(s): POCBNP    Badr Piedra D.O. on 10/05/2015 at 9:43 AM  Between 7am to 7pm - Pager - [phone]  After 7pm go to [email or web address] - password [email or web address]  And look for the night coverage person covering for me after hours  [facility] Office  [phone]

## 2015-10-05 NOTE — Progress Notes (Signed)
OT Cancellation Note  Patient Details Name: KERON NEENAN MRN: [id number] DOB: 18-Jun-1951   Cancelled Treatment:    Reason Eval/Treat Not Completed: Other (comment)  Nursing is cleaning pt up; will return later or tomorrow, as schedule permits.  Gurshan Settlemire 10/05/2015, 3:32 PM  Lesle Chris, OTR/L [phone] 10/05/2015

## 2015-10-05 NOTE — Progress Notes (Signed)
RN, Pamala Hurry, paged this NP because pt was desatting on 6L O2 and applying venti mask at 55% only increased his SaO2 to 89%. Asked RN to place pt on NRB which also did not increase his SaO2. Therefore, RT placed pt on bipap which pt was fine with. O2 sats normal on Bipap. Also, gave some morphine for tachypnea which helped. One dose of Lasix because pt was "wet" on exam. Reduced IVFs to [facility]. CXR shows continued PNA for which pt is being treated. Pt has hx of COPD and is on nebs. We will get an ABG 2 hours post bipap placement. Clance Boll, NP [facility]

## 2015-10-05 NOTE — Progress Notes (Signed)
Patient de sating with [place] at 6 liters, and Ventimask at 55%, oral suction at this time, scant thick clear  mucous noted.  RT at bedside, paged Baltazar Najjar NP regarding status. New orders noted and received. Patient placed on bi pap at this time, sat's 97%.

## 2015-10-05 NOTE — Progress Notes (Signed)
CPT not done at this time due to pt condition.

## 2015-10-06 ENCOUNTER — Encounter: Payer: Self-pay | Admitting: Nurse Practitioner

## 2015-10-06 ENCOUNTER — Encounter: Payer: Self-pay | Admitting: *Deleted

## 2015-10-06 DIAGNOSIS — R600 Localized edema: Secondary | ICD-10-CM | POA: Insufficient documentation

## 2015-10-06 DIAGNOSIS — R609 Edema, unspecified: Secondary | ICD-10-CM | POA: Insufficient documentation

## 2015-10-06 DIAGNOSIS — J189 Pneumonia, unspecified organism: Secondary | ICD-10-CM

## 2015-10-06 LAB — CBC WITH DIFFERENTIAL/PLATELET
BASOS PCT: 0 %
Basophils Absolute: 0 10*3/uL (ref 0.0–0.1)
EOS PCT: 0 %
Eosinophils Absolute: 0 10*3/uL (ref 0.0–0.7)
HCT: 25.9 % — ABNORMAL LOW (ref 39.0–52.0)
HEMOGLOBIN: 9.5 g/dL — AB (ref 13.0–17.0)
LYMPHS PCT: 79 %
Lymphs Abs: 0.1 10*3/uL — ABNORMAL LOW (ref 0.7–4.0)
MCH: 31.6 pg (ref 26.0–34.0)
MCHC: 36.7 g/dL — ABNORMAL HIGH (ref 30.0–36.0)
MCV: 86 fL (ref 78.0–100.0)
Monocytes Absolute: 0 10*3/uL — ABNORMAL LOW (ref 0.1–1.0)
Monocytes Relative: 7 %
NEUTROS ABS: 0 10*3/uL — AB (ref 1.7–7.7)
NEUTROS PCT: 14 %
Platelets: 45 10*3/uL — ABNORMAL LOW (ref 150–400)
RBC: 3.01 MIL/uL — ABNORMAL LOW (ref 4.22–5.81)
RDW: 13.5 % (ref 11.5–15.5)
WBC: 0.1 10*3/uL — CL (ref 4.0–10.5)

## 2015-10-06 LAB — COMPREHENSIVE METABOLIC PANEL
ALK PHOS: 53 U/L (ref 38–126)
ALT: 27 U/L (ref 17–63)
ANION GAP: 8 (ref 5–15)
AST: 12 U/L — ABNORMAL LOW (ref 15–41)
Albumin: 2.5 g/dL — ABNORMAL LOW (ref 3.5–5.0)
BILIRUBIN TOTAL: 1.4 mg/dL — AB (ref 0.3–1.2)
BUN: 18 mg/dL (ref 6–20)
CALCIUM: 8.3 mg/dL — AB (ref 8.9–10.3)
CO2: 28 mmol/L (ref 22–32)
Chloride: 86 mmol/L — ABNORMAL LOW (ref 101–111)
Creatinine, Ser: 0.53 mg/dL — ABNORMAL LOW (ref 0.61–1.24)
GFR calc Af Amer: >60 mL/min (ref >60)
GLUCOSE: 101 mg/dL — AB (ref 65–99)
POTASSIUM: 3.6 mmol/L (ref 3.5–5.1)
Sodium: 122 mmol/L — ABNORMAL LOW (ref 135–145)
TOTAL PROTEIN: 5.2 g/dL — AB (ref 6.5–8.1)

## 2015-10-06 LAB — VANCOMYCIN, TROUGH: Vancomycin Tr: 11 ug/mL (ref 10.0–20.0)

## 2015-10-06 LAB — LEGIONELLA PNEUMOPHILA SEROGP 1 UR AG: L. pneumophila Serogp 1 Ur Ag: NEGATIVE

## 2015-10-06 MED ORDER — LORAZEPAM 2 MG/ML IJ SOLN: 1.0000 mg | INTRAMUSCULAR | Status: DC | PRN

## 2015-10-06 MED ORDER — SODIUM CHLORIDE 0.9 % IV SOLN: INTRAVENOUS | Status: DC

## 2015-10-06 MED ORDER — MORPHINE SULFATE (PF) 2 MG/ML IV SOLN
1.0000 mg | INTRAVENOUS | Status: DC | PRN
  Administered 2015-10-06 (×2): 1 mg via INTRAVENOUS
  Administered 2015-10-07 – 2015-10-08 (×4): 2 mg via INTRAVENOUS
  Filled 2015-10-06 (×7): qty 1

## 2015-10-06 MED ORDER — CETYLPYRIDINIUM CHLORIDE 0.05 % MT LIQD
7.0000 mL | Freq: Two times a day (BID) | OROMUCOSAL | Status: DC
  Administered 2015-10-06 – 2015-10-07 (×3): 7 mL via OROMUCOSAL

## 2015-10-06 MED ORDER — VANCOMYCIN HCL 10 G IV SOLR
1250.0000 mg | Freq: Two times a day (BID) | INTRAVENOUS | Status: DC
  Administered 2015-10-06 – 2015-10-08 (×5): 1250 mg via INTRAVENOUS
  Filled 2015-10-06 (×6): qty 1250

## 2015-10-06 MED ORDER — RESOURCE THICKENUP CLEAR PO POWD
ORAL | Status: DC | PRN
Start: 2015-10-06 — End: 2015-10-08
  Filled 2015-10-06: qty 125

## 2015-10-06 MED ORDER — CHLORHEXIDINE GLUCONATE 0.12 % MT SOLN
15.0000 mL | Freq: Two times a day (BID) | OROMUCOSAL | Status: DC
  Administered 2015-10-06 – 2015-10-07 (×4): 15 mL via OROMUCOSAL
  Filled 2015-10-06 (×3): qty 15

## 2015-10-06 MED ORDER — MORPHINE SULFATE (PF) 2 MG/ML IV SOLN: 1.0000 mg | INTRAVENOUS | Status: DC | PRN

## 2015-10-06 MED ORDER — HYDRALAZINE HCL 20 MG/ML IJ SOLN
5.0000 mg | INTRAMUSCULAR | Status: DC | PRN
  Administered 2015-10-06 – 2015-10-07 (×2): 5 mg via INTRAVENOUS
  Filled 2015-10-06 (×2): qty 1

## 2015-10-06 MED ORDER — LORAZEPAM 2 MG/ML IJ SOLN
1.0000 mg | Freq: Once | INTRAMUSCULAR | Status: AC
  Administered 2015-10-06: 1 mg via INTRAVENOUS

## 2015-10-06 MED ORDER — LORAZEPAM 2 MG/ML IJ SOLN
1.0000 mg | INTRAMUSCULAR | Status: DC | PRN
  Administered 2015-10-07: 1 mg via INTRAVENOUS
  Filled 2015-10-06: qty 1

## 2015-10-06 NOTE — Progress Notes (Signed)
OT Cancellation Note  Patient Details Name: RHET RORKE MRN: [id number] DOB: September 02, 1951   Cancelled Treatment:    Reason Eval/Treat Not Completed: Other (comment).  Decreased alertness and on NRB. Will check back tomorrow.  Kimeka Badour 10/06/2015, 10:58 AM  Lesle Chris, OTR/L [phone] 10/06/2015

## 2015-10-06 NOTE — Progress Notes (Signed)
Spencer Long BJS:[id number] DOB: Mar 20, 1951 DOA: 10/03/2015 PCP: [name], MD  Brief narrative:   64 y/o ? admit 10/03/15 SCLC + brain mets s/p sterotactic XRT Chr hyponatremia HTN Sarcoid GErd Bipolar COPD  Admitted c pancytopenia-WBC 2.2, Hb 12, PLT 131 and concerns for R Basilar infiltrate   Past medical history-As per Problem list Chart reviewed as below- Reviewed  Consultants:  None  Procedures:  None  Antibiotics:  Vancomycin 10/03/15  Zosyn 10/03/15   Subjective  Unresponsive Awakens a little bit once BiPAP was taken off Events noted overnight-patient given Lasix, morphine, Ativan for agitation and hypoxia Cannot get review of systems   Objective    Interim History: None  Telemetry: Sinus tachycardia   Objective: Filed Vitals:   10/06/15 0439 10/06/15 0458 10/06/15 0500 10/06/15 0559  BP:   138/93 138/93  Pulse: 102 95 94   Temp:      TempSrc:      Resp: '23 20 22   '$ Height:      Weight:      SpO2: 100% 100% 100%     Intake/Output Summary (Last 24 hours) at 10/06/15 0730 Last data filed at 10/06/15 0540  Gross per 24 hour  Intake    508 ml  Output   2850 ml  Net  -2342 ml    Exam:  General: EOMI NCAT, did not examine oropharynx  Cardiovascular: Sinus tachycardia Respiratory: Coarse lung sounds bilaterally Abdomen: Soft nontender nondistended no rebound no guarding SkinNo lower extremity edema Neuro Intact to movement however not oriented and alert  Data Reviewed: Basic Metabolic Panel:  Recent Labs Lab 10/03/15 1801 10/05/15 0520 10/06/15 0500  NA 134* 124* 122*  K 4.5 3.7 3.6  CL 101 93* 86*  CO2 '24 24 28  '$ GLUCOSE 131* 107* 101*  BUN 38* 16 18  CREATININE 0.75 0.48* 0.53*  CALCIUM 8.0* 8.2* 8.3*   Liver Function Tests:  Recent Labs Lab 10/06/15 0500  AST 12*  ALT 27  ALKPHOS 53  BILITOT 1.4*  PROT 5.2*  ALBUMIN 2.5*   No results for input(s): LIPASE, AMYLASE in the last 168 hours. No  results for input(s): AMMONIA in the last 168 hours. CBC:  Recent Labs Lab 10/03/15 1801 10/04/15 1150 10/04/15 1253 10/05/15 1045 10/06/15 0500  WBC 2.2* 0.2* 0.2* 0.2* 0.1*  NEUTROABS  --  0.1* 0.1* 0.1* 0.0*  HGB 12.1* 10.8* 10.8* 10.3* 9.5*  HCT 34.8* 30.6* 30.9* 28.6* 25.9*  MCV 91.3 89.5 88.0 87.5 86.0  PLT 131* 77* 67* 64* 45*   Cardiac Enzymes: No results for input(s): CKTOTAL, CKMB, CKMBINDEX, TROPONINI in the last 168 hours. BNP: Invalid input(s): POCBNP CBG: No results for input(s): GLUCAP in the last 168 hours.  Recent Results (from the past 240 hour(s))  TECHNOLOGIST REVIEW     Status: None   Collection Time: 09/28/15  8:36 AM  Result Value Ref Range Status   Technologist Review rare myelocyte present  Final  Blood Culture (routine x 2)     Status: None (Preliminary result)   Collection Time: 10/03/15  6:30 PM  Result Value Ref Range Status   Specimen Description BLOOD PORTA CATH  Final   Special Requests BOTTLES DRAWN AEROBIC AND ANAEROBIC 5CC  Final   Culture   Final    NO GROWTH 1 DAY Performed at [facility]    Report Status PENDING  Incomplete  Blood Culture (routine x 2)     Status: None (Preliminary result)  Collection Time: 10/03/15  6:40 PM  Result Value Ref Range Status   Specimen Description BLOOD LEFT ANTECUBITAL  Final   Special Requests BOTTLES DRAWN AEROBIC AND ANAEROBIC 5CC  Final   Culture   Final    NO GROWTH 1 DAY Performed at [facility]    Report Status PENDING  Incomplete  MRSA PCR Screening     Status: None   Collection Time: 10/03/15  9:14 PM  Result Value Ref Range Status   MRSA by PCR NEGATIVE NEGATIVE Final    Comment:        The GeneXpert MRSA Assay (FDA approved for NASAL specimens only), is one component of a comprehensive MRSA colonization surveillance program. It is not intended to diagnose MRSA infection nor to guide or monitor treatment for MRSA infections.      Studies:              All  Imaging reviewed and is as per above notation   Scheduled Meds: . atenolol  25 mg Oral BID  . buPROPion  150 mg Oral BID  . dexamethasone  4 mg Oral BID  . dextromethorphan-guaiFENesin  1 tablet Oral BID  . feeding supplement (ENSURE ENLIVE)  237 mL Oral Daily  . FLORA-Q  1 capsule Oral Daily  . folic acid  1 mg Oral Daily  . ipratropium  0.5 mg Nebulization TID  . levalbuterol  0.63 mg Nebulization TID  . methylPREDNISolone (SOLU-MEDROL) injection  60 mg Intravenous Daily  . multivitamin with minerals  1 tablet Oral Daily  . nicotine  14 mg Transdermal Daily  . pantoprazole  40 mg Oral Daily  . piperacillin-tazobactam (ZOSYN)  IV  3.375 g Intravenous 3 times per day  . sodium chloride  2 g Oral TID WC  . thiamine  100 mg Oral Daily   Or  . thiamine  100 mg Intravenous Daily  . vancomycin  1,250 mg Intravenous BID   Continuous Infusions: . sodium chloride 30 mL/hr at 10/05/15 1954     Assessment/Plan:  Sepsis secondary to Aspiration pneumonia did have lactic acidosis with tachycardia, tachypnea on admit -Lactic acid has improved, currently 1.8 -Chest x-ray: New patchy bibasilar lung opacities, favor aspiration or pneumonia. -Continue IV vancomycin and Zosyn -Continue nebulizer treatments as needed, solumedrol, Mucinex -Influenza negative -Urine strep pneumonia antigen negative -Legionella urine antigen pending, blood cultures 12.4.16 pending, sputum culture pending  Acute /chr Resp failure with hypoxia And concerns for aspiration pneumonia -Secondary to pneumonia -Seems he became dependent on BiPAP at least over night -Monitor O2 sats and attempt to wean again to face mask -Have encouraged wife to discuss goals of care again with palliative care as if this were to happen at home, unclear as to what other options would be available. -Chest x-ray 12/4, 12/6 concerning for aspiration -Continue oxygenation to maintain saturations above 92%  Small cell lung cancer with brain  metastasis -Diagnosed in October 2015, s/p stereotactic radiation therapy, 6 cycles of chemotherapy -Last chemotherapy treatment was on 09/30/2015 -Continue Decadron for brain metastasis  Sarcoidosis -Calcium stable continue breathing treatments as needed  Essential hypertension -Amlodipine, Edarbi held -Continue atenolol, IV hydralazine as needed  Pancytopenia -Likely secondary to chemotherapy -Spoke with Dr. [name], patient Given a dose of Granix 300 mcg on 10/05/15 -Monitor CBC  Chronic hyponatremiaSecondary to lung cancer -Continue sodium chloride tabs -Monitor BMP -TSH 0.055 -Start low-dose levothyroxine If free T4, free T3 concordant with Hypothyroidism although I believe that this is probably sick  euthyroid  Depression -Continue WellbutrinWhen able to take by mouth  GERD -Continue PPI  Tobacco and alcohol abuse -Patient counseled, continue nicotine patch and CIWA protocol  Goals of care -Patient is DO NOT RESUSCITATE. Wife was concerned about, "when is enough amount". -Palliative care to reengage with patient     Verneita Griffes, MD  [facility] Pager [phone] 10/06/2015, 7:30 AM    LOS: 3 days

## 2015-10-06 NOTE — Progress Notes (Signed)
Speech Language Pathology Treatment: Dysphagia  Patient Details Name: Spencer Long MRN: [id number] DOB: Apr 23, 1951 Today's Date: 10/06/2015 Time: 0830-0920 SLP Time Calculation (min) (ACUTE ONLY): 50 min  Assessment / Plan / Recommendation Clinical Impression  Thorough education provided to wife regarding etiology and nature of dysphagia following MBS, possible options for treatment plan and verbal/written explanation of thickening as well as demonstration. For now pt is on a highly restrictive diet to reduce risk of aspiration while respiratory function is impaired. Consideration for comfort feeds also discussed depending on pts progress and response to restrictions, which may be poor. Also discussed precautions with RN and need for implementation of oral care protocol, which was also ordered. Will follow for progress. Wife would like to see SLP feed pt, but pt is on nonrebreather and not alert this am after being given sedatives last night and this am. Will attempt later this am or tomorrow.    HPI HPI: Spencer Long is a 64 y.o. male with PMH of small cell lung cancer with brain metastasis (s/p of stereotactic radiation therapy, currently on chemotherapy), chronic hyponatremia on salt tablets, HTN, sarcoidosis, gerd, depression, former smoker, COPD, who presents with cough and shortness of breath. Patient reports that he had chemotherapy on 09/30/15. After that, he started having SOB and cough, which have been progressively getting worse. He coughs up whitish colored mucus. Chest x-ray showed patchy right basilar infiltration.      SLP Plan  Continue with current plan of care     Recommendations  Diet recommendations: Dysphagia 1 (puree);Honey-thick liquid Liquids provided via: Teaspoon Medication Administration: Whole meds with puree Supervision: Full supervision/cueing for compensatory strategies Compensations: Minimize environmental distractions;Multiple dry swallows after each  bite/sip;Clear throat intermittently              Oral Care Recommendations: Oral care BID Follow up Recommendations: 24 hour supervision/assistance Plan: Continue with current plan of care   Spencer Long, Spencer Long 10/06/2015, 9:33 AM

## 2015-10-06 NOTE — Progress Notes (Signed)
Oncology Nurse Navigator Documentation  Oncology Nurse Navigator Flowsheets 10/06/2015  Navigator Encounter Type Other/I received a phone call from Ms. Heisler.  She was crying and was telling me Mr. Hallenbeck is not doing well.  I asked where she was and she was in the hospital.  I came to see her.  I listened as she expressed her sadness with her husbands decline.  I provided support and waited until a friend of hers came to be with her before I left.  I then went to see Mr. Lipsky.  He in non-communicative.  I will update Dr. [name].  Patient Visit Type Inpatient  Treatment Phase Other  Time Spent with Patient 59

## 2015-10-06 NOTE — Progress Notes (Signed)
PT Cancellation Note  Patient Details Name: Spencer Long MRN: [id number] DOB: Apr 27, 1951   Cancelled Treatment:    Reason Eval/Treat Not Completed: Medical issues which prohibited therapy (on nonrebreather, RN reports decreased arousal and alertness today)   Kyen Taite,KATHrine E 10/06/2015, 10:18 AM Carmelia Bake, PT, DPT 10/06/2015 Pager: [phone]

## 2015-10-06 NOTE — Progress Notes (Signed)
Daily Progress Note   Patient Name: Spencer Long       Date: 10/06/2015 DOB: 09-Apr-1951  Age: 64 y.o. MRN#: [id number] Attending Physician: Nita Sells, MD Primary Care Physician: [name], MD Admit Date: 10/03/2015  Reason for Consultation/Follow-up: Establishing goals of care  Subjective: Mr. Spencer Long appears much declined since I saw him yesterday. He is confused, agitated, short of breath. Required BiPAP overnight. I did speak privately with [name] and I was honest that I am very worried with how he looks today and worry he is approaching end of life. I helped her to call her brother and Mr. Siragusa's brother and recommend that they think of visiting soon if they wish too. [name] is very tearful and has many thoughts rushing through her head including about funeral arrangements. Offered therapeutic listening and emotional support. Agreed to continue current therapy at this time but also allow medications as needed to prevent suffering and for comfort. I will follow up with her in the morning. Likely to move more towards comfort but I believe my opinion that he is likely dying came as a shock to her this morning.   Length of Stay: 3 days  Current Medications: Scheduled Meds:  . antiseptic oral rinse  7 mL Mouth Rinse BID  . atenolol  25 mg Oral BID  . buPROPion  150 mg Oral BID  . chlorhexidine  15 mL Mouth Rinse BID  . dexamethasone  4 mg Oral BID  . dextromethorphan-guaiFENesin  1 tablet Oral BID  . feeding supplement (ENSURE ENLIVE)  237 mL Oral Daily  . FLORA-Q  1 capsule Oral Daily  . folic acid  1 mg Oral Daily  . ipratropium  0.5 mg Nebulization TID  . levalbuterol  0.63 mg Nebulization TID  . methylPREDNISolone (SOLU-MEDROL) injection  60 mg  Intravenous Daily  . multivitamin with minerals  1 tablet Oral Daily  . nicotine  14 mg Transdermal Daily  . pantoprazole  40 mg Oral Daily  . piperacillin-tazobactam (ZOSYN)  IV  3.375 g Intravenous 3 times per day  . sodium chloride  2 g Oral TID WC  . thiamine  100 mg Oral Daily   Or  . thiamine  100 mg Intravenous Daily  . vancomycin  1,250 mg Intravenous BID    Continuous Infusions: .  sodium chloride 30 mL/hr at 10/05/15 1954    PRN Meds: levalbuterol, lidocaine-prilocaine, LORazepam, morphine injection, RESOURCE THICKENUP CLEAR, white petrolatum  Physical Exam: Physical Exam  Constitutional: He appears well-developed.  HENT:  Head: Normocephalic.  Cardiovascular: Tachycardia present.   Pulmonary/Chest: Accessory muscle usage present. Tachypnea noted. He is in respiratory distress.  Abdominal: Normal appearance.  Neurological: He is disoriented.                Vital Signs: BP 141/72 mmHg  Pulse 96  Temp(Src) 97.9 F (36.6 C) (Axillary)  Resp 19  Ht '5\' 9"'$  (1.753 m)  Wt 74.1 kg (163 lb 5.8 oz)  BMI 24.11 kg/m2  SpO2 98% SpO2: SpO2: 98 % O2 Device: O2 Device: NRB O2 Flow Rate: O2 Flow Rate (L/min): 15 L/min  Intake/output summary:  Intake/Output Summary (Last 24 hours) at 10/06/15 1024 Last data filed at 10/06/15 0700  Gross per 24 hour  Intake    848 ml  Output   2850 ml  Net  -2002 ml   LBM: Last BM Date: 10/06/15 Baseline Weight: Weight: 71 kg (156 lb 8.4 oz) Most recent weight: Weight: 74.1 kg (163 lb 5.8 oz)       Palliative Assessment/Data:   Additional Data Reviewed: CBC    Component Value Date/Time   WBC 0.1* 10/06/2015 0500   WBC 16.1* 09/28/2015 0836   RBC 3.01* 10/06/2015 0500   RBC 4.31 09/28/2015 0836   HGB 9.5* 10/06/2015 0500   HGB 13.4 09/28/2015 0836   HCT 25.9* 10/06/2015 0500   HCT 40.3 09/28/2015 0836   PLT 45* 10/06/2015 0500   PLT 209 09/28/2015 0836   MCV 86.0 10/06/2015 0500   MCV 93.5 09/28/2015 0836   MCH 31.6  10/06/2015 0500   MCH 31.1 09/28/2015 0836   MCHC 36.7* 10/06/2015 0500   MCHC 33.2 09/28/2015 0836   RDW 13.5 10/06/2015 0500   RDW 14.6 09/28/2015 0836   LYMPHSABS 0.1* 10/06/2015 0500   LYMPHSABS 0.7* 09/28/2015 0836   MONOABS 0.0* 10/06/2015 0500   MONOABS 0.9 09/28/2015 0836   EOSABS 0.0 10/06/2015 0500   EOSABS 0.0 09/28/2015 0836   BASOSABS 0.0 10/06/2015 0500   BASOSABS 0.0 09/28/2015 0836    CMP     Component Value Date/Time   NA 122* 10/06/2015 0500   NA 137 09/28/2015 0836   K 3.6 10/06/2015 0500   K 4.5 09/28/2015 0836   CL 86* 10/06/2015 0500   CO2 28 10/06/2015 0500   CO2 24 09/28/2015 0836   GLUCOSE 101* 10/06/2015 0500   GLUCOSE 87 09/28/2015 0836   BUN 18 10/06/2015 0500   BUN 40.8* 09/28/2015 0836   CREATININE 0.53* 10/06/2015 0500   CREATININE 0.8 09/28/2015 0836   CALCIUM 8.3* 10/06/2015 0500   CALCIUM 8.5 09/28/2015 0836   PROT 5.2* 10/06/2015 0500   PROT 5.7* 09/28/2015 0836   ALBUMIN 2.5* 10/06/2015 0500   ALBUMIN 3.3* 09/28/2015 0836   AST 12* 10/06/2015 0500   AST 14 09/28/2015 0836   ALT 27 10/06/2015 0500   ALT 27 09/28/2015 0836   ALKPHOS 53 10/06/2015 0500   ALKPHOS 59 09/28/2015 0836   BILITOT 1.4* 10/06/2015 0500   BILITOT 0.78 09/28/2015 0836   GFRNONAA >60 10/06/2015 0500   GFRAA >60 10/06/2015 0500       Problem List:  Patient Active Problem List   Diagnosis Date Noted  . HCAP (healthcare-associated pneumonia) 10/03/2015  . Acute on chronic respiratory failure with hypoxia ([place]) 10/03/2015  .  Sepsis ([place]) 10/03/2015  . Pancytopenia ([place]) 10/03/2015  . Alcohol abuse 10/03/2015  . Brain metastases ([place])   . Palliative care encounter   . Hyponatremia 09/06/2015  . Brain metastasis ([place]) 09/06/2015  . Acute encephalopathy 09/06/2015  . Depression 09/06/2015  . Essential hypertension 09/06/2015  . DNR no code (do not resuscitate) 08/02/2015  . COPD (chronic obstructive pulmonary disease) ([place]) 05/21/2015  . Sarcoidosis  ([place]) 03/25/2015  . Small cell lung cancer ([place]) 08/31/2014  . History of tobacco abuse 08/11/2014     Palliative Care Assessment & Plan    1.Code Status:  DNR    Code Status Orders        Start     Ordered   10/03/15 2009  Do not attempt resuscitation (DNR)   Continuous    Question Answer Comment  In the event of cardiac or respiratory ARREST Do not call a "code blue"   In the event of cardiac or respiratory ARREST Do not perform Intubation, CPR, defibrillation or ACLS   In the event of cardiac or respiratory ARREST Use medication by any route, position, wound care, and other measures to relive pain and suffering. May use oxygen, suction and manual treatment of airway obstruction as needed for comfort.      10/03/15 2010    Advance Directive Documentation        Most Recent Value   Type of Advance Directive  Healthcare Power of Attorney   Pre-existing out of facility DNR order (yellow form or pink MOST form)     "MOST" Form in Place?         2. Goals of Care/Additional Recommendations:  Comfort focus. Continue current therapies. Avoid BiPAP if possible but okay for trial. Continue current therapies.   Limitations on Scope of Treatment: No Artificial Feeding  Desire for further Chaplaincy support:no  Psycho-social Needs: Caregiving  Support/Resources, Education on Hospice, Funeral Planning/Counseling and Grief/Bereavement Support  3. Symptom Management:      1. SOB/dyspnea: Morphine 1-2 mg every 3 hours. MAY be increased dosage/frequency to achieve comfort.       2. Anxiety/Agitation: Lorazepam 1 mg every 4 hours.   4. Palliative Prophylaxis:   Bowel Regimen, Delirium Protocol and Frequent Pain Assessment  5. Prognosis: Hours - Days  6. Discharge Planning:  Hospital death vs hospice facility    Thank you for allowing the Palliative Medicine Team to assist in the care of this patient.   Time In: 0915 Time Out: 1030 Total Time 75 min Prolonged Time  Billed  yes         Pershing Proud, NP  31/11/8116, 10:24 AM  Please contact Palliative Medicine Team phone at [phone] for questions and concerns.

## 2015-10-06 NOTE — Assessment & Plan Note (Signed)
Patient received cycle 1, day 1 of his carboplatin/etoposide chemotherapy regimen on 09/28/2015.  He presented to the [facility] today for cycle 1, day 3 of the etoposide only portion of his chemotherapy.  Patient is complaining of some increased bilateral lower extremity edema.  He notes that his left lower tibia slightly larger than the right.  He denies any calf pain.  He denies any chest pain, chest pressure, or pain with inspiration.  He also denies any recent fevers or chills.  Patient is scheduled to return for labs only on 10/12/2015.  Patient is scheduled for labs, flush, visit, and chemotherapy on the December 20th 2016.

## 2015-10-06 NOTE — Progress Notes (Signed)
MBSS complete. Full report located under chart review in imaging section. Damonta Cossey, MA CCC-SLP [phone]  

## 2015-10-06 NOTE — Assessment & Plan Note (Signed)
Pt with c/o bilat lower extremity edema; with the left greater than the right. He feels that the edema is causing increased difficulty ambulating. He denies any chest pain, chest pressure, worsening shortness of breath or pain with inspiration.  On exam.-Patient does have +2 edema to his bilateral lower extremities; with the left slightly greater than the right. There is no calf tenderness on exam.  All pulses are palpable.  Doppler ultrasound of the left lower extremity was negative for DVT.  Patient was advised to elevate his legs above the level of his heart whenever he is resting; and to remain as active as possible.

## 2015-10-06 NOTE — Progress Notes (Signed)
ANTIBIOTIC CONSULT NOTE - F/u  Pharmacy Consult for vancomycin Indication: rule out pneumonia  Allergies  Allergen Reactions  . Varenicline     Chantix - caused strange behavior  . Isothiazolinone Chloride Rash  . Oxycodone Rash  . Quaternium-15 Rash    Patient Measurements: Height: '5\' 9"'$  (175.3 cm) Weight: 163 lb 5.8 oz (74.1 kg) IBW/kg (Calculated) : 70.7 Adjusted Body Weight:   Vital Signs: Temp: 98.1 F (36.7 C) (12/07 0338) Temp Source: Axillary (12/07 0338) BP: 138/93 mmHg (12/07 0559) Pulse Rate: 94 (12/07 0500) Intake/Output from previous day: 12/06 0701 - 12/07 0700 In: 508 [I.V.:208; IV Piggyback:300] Out: 2850 [Urine:2850] Intake/Output from this shift: Total I/O In: 258 [I.V.:208; IV Piggyback:50] Out: 1300 [Urine:1300]  Labs:  Recent Labs  10/03/15 1801  10/04/15 1253 10/05/15 0520 10/05/15 1045 10/06/15 0500  WBC 2.2*  < > 0.2*  --  0.2* 0.1*  HGB 12.1*  < > 10.8*  --  10.3* 9.5*  PLT 131*  < > 67*  --  64* 45*  CREATININE 0.75  --   --  0.48*  --  0.53*  < > = values in this interval not displayed. Estimated Creatinine Clearance: 92.1 mL/min (by C-G formula based on Cr of 0.53).  Recent Labs  10/06/15 0500  [place] 11     Microbiology: Recent Results (from the past 720 hour(s))  Urine culture     Status: None   Collection Time: 09/06/15  8:17 AM  Result Value Ref Range Status   Specimen Description URINE, CLEAN CATCH  Final   Special Requests NONE  Final   Culture   Final    NO GROWTH 1 DAY Performed at [facility]    Report Status 09/07/2015 FINAL  Final  TECHNOLOGIST REVIEW     Status: None   Collection Time: 09/28/15  8:36 AM  Result Value Ref Range Status   Technologist Review rare myelocyte present  Final  Blood Culture (routine x 2)     Status: None (Preliminary result)   Collection Time: 10/03/15  6:30 PM  Result Value Ref Range Status   Specimen Description BLOOD PORTA CATH  Final   Special Requests  BOTTLES DRAWN AEROBIC AND ANAEROBIC 5CC  Final   Culture   Final    NO GROWTH 1 DAY Performed at [facility]    Report Status PENDING  Incomplete  Blood Culture (routine x 2)     Status: None (Preliminary result)   Collection Time: 10/03/15  6:40 PM  Result Value Ref Range Status   Specimen Description BLOOD LEFT ANTECUBITAL  Final   Special Requests BOTTLES DRAWN AEROBIC AND ANAEROBIC 5CC  Final   Culture   Final    NO GROWTH 1 DAY Performed at [facility]    Report Status PENDING  Incomplete  MRSA PCR Screening     Status: None   Collection Time: 10/03/15  9:14 PM  Result Value Ref Range Status   MRSA by PCR NEGATIVE NEGATIVE Final    Comment:        The GeneXpert MRSA Assay (FDA approved for NASAL specimens only), is one component of a comprehensive MRSA colonization surveillance program. It is not intended to diagnose MRSA infection nor to guide or monitor treatment for MRSA infections.     Medical History: Past Medical History  Diagnosis Date  . Hypertension     under control, has been on med. x 15-20 yrs.  [name] GERD (gastroesophageal reflux disease)   .  Infection of elbow 2011    right-no infection at present , but has skin lesions present  . Sarcoidosis ([place])     "tends to have skin flareups" and presently experiencing now"feet, legs and arms"  . Chronic hyponatremia     Dr. Buddy Duty follows  . Radiation 02/11/15-02/25/15    brain 25 Gy  . DNR no code (do not resuscitate) 08/02/2015  . Lung cancer ([place])   . Brain metastases ([place])   . COPD (chronic obstructive pulmonary disease) ([place])   . Depression    Assessment: 28 YOM presents with shortness of breath and hypoxia. Has history of lung cancer with brain mets on chemo.  Possible pneumonia per CXR.  Lactic acid elevated. Pharmacy asked to dose vancomycin and zosyn dosed per MD  12/4 >> vanco >> 12/4 >> pip/tazo  >>    / blood:  Renal: SCr WNL WBC decreased s/p carboplatin 12/1  Dose  changes/levels: 0500 VT=11, on 1gm IV q12h  Goal of Therapy:  Vancomycin trough level 15-20 mcg/ml  Plan:   Increase Vancomycin to '1250mg'$  IV q12h  F/u SCr/cultures/additional levels as needed   Lawana Pai R 10/06/2015,6:14 AM

## 2015-10-06 NOTE — Progress Notes (Signed)
Two attempts were made to obtain abg by myself and another RT but were unsuccessful.  RN aware.  Forrest Moron  notified.

## 2015-10-06 NOTE — Progress Notes (Signed)
I spoke with [name] this afternoon and she is going home to rest. She is understandably very tearful and upset but is trying to come to terms with the reality of her husband's decline. Emotional support in the morning and we will talk more about possibly getting him to hospice if that is appropriate for him. Will f/u in am.   Vinie Sill, NP Palliative Medicine Team Pager # [phone] (M-F 8a-5p) Team Phone # [phone] (Nights/Weekends)

## 2015-10-06 NOTE — Progress Notes (Signed)
SYMPTOM MANAGEMENT CLINIC   HPI: Spencer Long 64 y.o. male diagnosed with lung cancer; with brain metastasis.  Currently undergoing carbo plan/etoposide chemotherapy regimen.  Patient received cycle 1, day 1 of his carboplatin/etoposide chemotherapy regimen on 09/28/2015.  He presented to the [facility] today for cycle 1, day 3 of the etoposide only portion of his chemotherapy.  Patient is complaining of some increased bilateral lower extremity edema.  He notes that his left lower tibia slightly larger than the right.  He denies any calf pain.  He denies any chest pain, chest pressure, or pain with inspiration.  He also denies any recent fevers or chills.  Patient is scheduled to return for labs only on 10/12/2015.  Patient is scheduled for labs, flush, visit, and chemotherapy on the December 20th 2016.   HPI  ROS  Past Medical History  Diagnosis Date  . Hypertension     under control, has been on med. x 15-20 yrs.  [name] GERD (gastroesophageal reflux disease)   . Infection of elbow 2011    right-no infection at present , but has skin lesions present  . Sarcoidosis ([place])     "tends to have skin flareups" and presently experiencing now"feet, legs and arms"  . Chronic hyponatremia     Dr. Buddy Duty follows  . Radiation 02/11/15-02/25/15    brain 25 Gy  . DNR no code (do not resuscitate) 08/02/2015  . Lung cancer ([place])   . Brain metastases ([place])   . COPD (chronic obstructive pulmonary disease) ([place])   . Depression     Past Surgical History  Procedure Laterality Date  . Cholecystectomy  05/2006  . Ulnar nerve repair  01/2008    right; decompression  . Carpal tunnel release  01/2008    right  . Eye surgery  12/2008    correction ectropion, release band, wedge exc., shortening  . I&d extremity  09/12/2011    Procedure: IRRIGATION AND DEBRIDEMENT EXTREMITY;  Surgeon: Tennis Must;  Location: [facility];  Service: Orthopedics;  Laterality: Right;  I&d RIGHT  OLECRANON BURSSA   . Tonsillectomy    . Endobronchial ultrasound Bilateral 08/24/2014    Procedure: ENDOBRONCHIAL ULTRASOUND;  Surgeon: Collene Gobble, MD;  Location: [facility];  Service: Cardiopulmonary;  Laterality: Bilateral;    has History of tobacco abuse; Small cell lung cancer ([place]); Sarcoidosis ([place]); COPD (chronic obstructive pulmonary disease) ([place]); DNR no code (do not resuscitate); Hyponatremia; Brain metastasis ([place]); Acute encephalopathy; Depression; Essential hypertension; Palliative care encounter; Brain metastases ([place]); HCAP (healthcare-associated pneumonia); Acute on chronic respiratory failure with hypoxia ([place]); Sepsis ([place]); Pancytopenia ([place]); Alcohol abuse; and Peripheral edema on his problem list.    is allergic to varenicline; isothiazolinone chloride; oxycodone; and quaternium-15.    Medication List       This list is accurate as of: 09/30/15 11:59 PM.  Always use your most recent med list.               Albuterol Sulfate 108 (90 BASE) MCG/ACT Aepb  Commonly known as:  PROAIR RESPICLICK  Inhale 2 puffs into the lungs every 4 (four) hours as needed.     amLODipine 10 MG tablet  Commonly known as:  NORVASC  Take 10 mg by mouth every morning.     aspirin EC 81 MG tablet  Take 81 mg by mouth every morning.     atenolol 25 MG tablet  Commonly known as:  TENORMIN  Take 25 mg by mouth 2 (  two) times daily.     buPROPion 150 MG 12 hr tablet  Commonly known as:  WELLBUTRIN SR  Take 1 tablet (150 mg total) by mouth 2 (two) times daily.     dexamethasone 4 MG tablet  Commonly known as:  DECADRON  Take 1 tablet (4 mg total) by mouth 4 (four) times daily.     EDARBI 80 MG Tabs  Generic drug:  Azilsartan Medoxomil  Take 80 mg by mouth daily.     feeding supplement (ENSURE ENLIVE) Liqd  Take 237 mLs by mouth 2 (two) times daily between meals.     furosemide 20 MG tablet  Commonly known as:  LASIX  Take 1 tablet (20 mg total) by mouth daily.      lidocaine-prilocaine cream  Commonly known as:  EMLA  Apply 1 application topically as needed.     MULTIVITAMIN ADULTS 50+ PO  Take 1 tablet by mouth every morning.     omeprazole 40 MG capsule  Commonly known as:  PRILOSEC  Take 40 mg by mouth every morning.     PROBIOTIC DAILY PO  Take 1 tablet by mouth every morning.     prochlorperazine 10 MG tablet  Commonly known as:  COMPAZINE  Take 1 tablet (10 mg total) by mouth every 6 (six) hours as needed for nausea or vomiting.     sodium chloride 1 G tablet  Take 2 tablets (2 g total) by mouth 3 (three) times daily with meals.     SPIRIVA RESPIMAT 1.25 MCG/ACT Aers  Generic drug:  Tiotropium Bromide Monohydrate  Inhale 2 puffs into the lungs daily.     white petrolatum Gel  Commonly known as:  VASELINE  Apply 1 application topically daily as needed for lip care or dry skin.         PHYSICAL EXAMINATION  Oncology Vitals 10/06/2015 10/06/2015  Height - -  Weight - -  Weight (lbs) - -  BMI (kg/m2) - -  Temp 98.1 -  Pulse - 109  Resp - 20  SpO2 - 97  BSA (m2) - -   BP Readings from Last 2 Encounters:  10/06/15 173/63  09/30/15 113/56    Physical Exam  Constitutional: He is oriented to person, place, and time. Vital signs are normal. He appears unhealthy.  HENT:  Head: Normocephalic.  Eyes: Conjunctivae and EOM are normal. Pupils are equal, round, and reactive to light.  Neck: Normal range of motion.  Pulmonary/Chest: Effort normal. No respiratory distress.  Musculoskeletal: Normal range of motion. He exhibits edema. He exhibits no tenderness.  Bilateral lower extremity edema; with the left slightly greater than the right.  There is no erythema, warmth, calf tenderness, or red streaks.  Patient appears to have full range of motion.  Neurological: He is alert and oriented to person, place, and time.  Skin: Skin is warm and dry. No rash noted. No erythema. No pallor.  Psychiatric: Affect normal.  Nursing note and  vitals reviewed.   LABORATORY DATA:. Appointment on 09/28/2015  Component Date Value Ref Range Status  . WBC 09/28/2015 16.1* 4.0 - 10.3 10e3/uL Final  . NEUT# 09/28/2015 14.5* 1.5 - 6.5 10e3/uL Final  . HGB 09/28/2015 13.4  13.0 - 17.1 g/dL Final  . HCT 09/28/2015 40.3  38.4 - 49.9 % Final  . Platelets 09/28/2015 209  140 - 400 10e3/uL Final  . MCV 09/28/2015 93.5  79.3 - 98.0 fL Final  . MCH 09/28/2015 31.1  27.2 - 33.4  pg Final  . MCHC 09/28/2015 33.2  32.0 - 36.0 g/dL Final  . RBC 09/28/2015 4.31  4.20 - 5.82 10e6/uL Final  . RDW 09/28/2015 14.6  11.0 - 14.6 % Final  . lymph# 09/28/2015 0.7* 0.9 - 3.3 10e3/uL Final  . MONO# 09/28/2015 0.9  0.1 - 0.9 10e3/uL Final  . Eosinophils Absolute 09/28/2015 0.0  0.0 - 0.5 10e3/uL Final  . Basophils Absolute 09/28/2015 0.0  0.0 - 0.1 10e3/uL Final  . NEUT% 09/28/2015 90.3* 39.0 - 75.0 % Final  . LYMPH% 09/28/2015 4.3* 14.0 - 49.0 % Final  . MONO% 09/28/2015 5.4  0.0 - 14.0 % Final  . EOS% 09/28/2015 0.0  0.0 - 7.0 % Final  . BASO% 09/28/2015 0.0  0.0 - 2.0 % Final  . Sodium 09/28/2015 137  136 - 145 mEq/L Final  . Potassium 09/28/2015 4.5  3.5 - 5.1 mEq/L Final  . Chloride 09/28/2015 103  98 - 109 mEq/L Final  . CO2 09/28/2015 24  22 - 29 mEq/L Final  . Glucose 09/28/2015 87  70 - 140 mg/dl Final   Glucose reference range is for nonfasting patients. Fasting glucose reference range is 70- 100.  [name] BUN 09/28/2015 40.8* 7.0 - 26.0 mg/dL Final  . Creatinine 09/28/2015 0.8  0.7 - 1.3 mg/dL Final  . Total Bilirubin 09/28/2015 0.78  0.20 - 1.20 mg/dL Final  . Alkaline Phosphatase 09/28/2015 59  40 - 150 U/L Final  . AST 09/28/2015 14  5 - 34 U/L Final  . ALT 09/28/2015 27  0 - 55 U/L Final  . Total Protein 09/28/2015 5.7* 6.4 - 8.3 g/dL Final  . Albumin 09/28/2015 3.3* 3.5 - 5.0 g/dL Final  . Calcium 09/28/2015 8.5  8.4 - 10.4 mg/dL Final  . Anion Gap 09/28/2015 10  3 - 11 mEq/L Final  . EGFR 09/28/2015 >90  >90 ml/min/1.73 m2 Final   eGFR  is calculated using the CKD-EPI Creatinine Equation (2009)  . Magnesium 09/28/2015 2.5  1.5 - 2.5 mg/dl Final  . Technologist Review 09/28/2015 rare myelocyte present   Final     RADIOGRAPHIC STUDIES: Dg Chest Port 1 View  10/05/2015  CLINICAL DATA:  Hypoxia. EXAM: PORTABLE CHEST 1 VIEW COMPARISON:  October 03, 2015. FINDINGS: The heart size and mediastinal contours are within normal limits. No pneumothorax or pleural effusion is noted. Right internal jugular Port-A-Cath is again noted with distal tip in expected position of the SVC. Left lung is clear. Mild right basilar opacity is noted concerning for possible pneumonia or subsegmental atelectasis. The visualized skeletal structures are unremarkable. IMPRESSION: Mild right basilar opacity concerning for pneumonia or subsegmental atelectasis. Electronically Signed   By: Marijo Conception, M.D.   On: 10/05/2015 20:29   Dg Chest Port 1 View  10/03/2015  CLINICAL DATA:  Shortness of breath.  Lung cancer. EXAM: PORTABLE CHEST 1 VIEW COMPARISON:  09/06/2015 chest radiograph. FINDINGS: Right internal jugular MediPort terminates in the lower third of the superior vena cava. Stable cardiomediastinal silhouette with normal heart size. No pneumothorax. No pleural effusion. New patchy opacities are present at both lung bases. IMPRESSION: New patchy bibasilar lung opacities, favor aspiration or pneumonia. A component of tumor progression cannot be excluded. Electronically Signed   By: Ilona Sorrel M.D.   On: 10/03/2015 17:59   Dg Swallowing Func-speech Pathology  10/05/2015  Objective Swallowing Evaluation:   Patient Details Name: PIERCE BAROCIO MRN: [id number] Date of Birth: 11/12/1950 Today's Date: 10/05/2015 Time: SLP Start Time (  ACUTE ONLY): 1400-SLP Stop Time (ACUTE ONLY): 1420 SLP Time Calculation (min) (ACUTE ONLY): 20 min Past Medical History: Past Medical History Diagnosis Date . Hypertension    under control, has been on med. x 15-20 yrs. [name] GERD  (gastroesophageal reflux disease)  . Infection of elbow 2011   right-no infection at present , but has skin lesions present . Sarcoidosis ([place])    "tends to have skin flareups" and presently experiencing now"feet, legs and arms" . Chronic hyponatremia    Dr. Buddy Duty follows . Radiation 02/11/15-02/25/15   brain 25 Gy . DNR no code (do not resuscitate) 08/02/2015 . Lung cancer ([place])  . Brain metastases ([place])  . COPD (chronic obstructive pulmonary disease) ([place])  . Depression  Past Surgical History: Past Surgical History Procedure Laterality Date . Cholecystectomy  05/2006 . Ulnar nerve repair  01/2008   right; decompression . Carpal tunnel release  01/2008   right . Eye surgery  12/2008   correction ectropion, release band, wedge exc., shortening . I&d extremity  09/12/2011   Procedure: IRRIGATION AND DEBRIDEMENT EXTREMITY;  Surgeon: Tennis Must;  Location: [facility];  Service: Orthopedics;  Laterality: Right;  I&d RIGHT OLECRANON BURSSA  . Tonsillectomy   . Endobronchial ultrasound Bilateral 08/24/2014   Procedure: ENDOBRONCHIAL ULTRASOUND;  Surgeon: Collene Gobble, MD;  [facility];  Service: Cardiopulmonary;  Laterality: Bilateral; HPI: GILMER KAMINSKY is a 64 y.o. male with PMH of small cell lung cancer with brain metastasis (s/p of stereotactic radiation therapy, currently on chemotherapy), chronic hyponatremia on salt tablets, HTN, sarcoidosis, gerd, depression, former smoker, COPD, who presents with cough and shortness of breath. Patient reports that he had chemotherapy on 09/30/15. After that, he started having SOB and cough, which have been progressively getting worse. He coughs up whitish colored mucus. Chest x-ray showed patchy right basilar infiltration. No Data Recorded Assessment / Plan / Recommendation CHL IP CLINICAL IMPRESSIONS 10/05/2015 Therapy Diagnosis Moderate pharyngeal phase dysphagia;Severe pharyngeal phase dysphagia Clinical Impression Pt demonstrates a mild oral dysphagia  with oral residuals and reduced bolus cohesion with significant premature spillage of solids to pharynx. Oropharyngeal phase initially appeared normal, but becaome progressively worse as study progressed though pt did not appear significantly fatigued. There was increased delay in swallow, decreased laryngeal closure and increased residuals leading to silent penetration and aspiration events with all methods with thin and nectar thick liquids. Chin tuck ineffective Only honey teaspoon, puree and solids were tolerated without penetration. Recommend pt initaite a Dys 1 (puree) honey thick diet with supervision for second swallows. Suspect that even with this restrictive diet pt will have silent aspiration events and that pt may have vocal fold abnormality interfering with airway protection. SLP will follow for tolerance.  Impact on safety and function Moderate aspiration risk   CHL IP TREATMENT RECOMMENDATION 10/05/2015 Treatment Recommendations Therapy as outlined in treatment plan below   Prognosis 10/05/2015 Prognosis for Safe Diet Advancement Guarded Barriers to Reach Goals Cognitive deficits Barriers/Prognosis Comment -- CHL IP DIET RECOMMENDATION 10/05/2015 SLP Diet Recommendations Dysphagia 1 (Puree) solids;Honey thick liquids Liquid Administration via Spoon Medication Administration Whole meds with puree Compensations Minimize environmental distractions;Multiple dry swallows after each bite/sip;Clear throat intermittently Postural Changes Seated upright at 90 degrees   CHL IP OTHER RECOMMENDATIONS 10/05/2015 Recommended Consults -- Oral Care Recommendations Oral care BID Other Recommendations Order thickener from pharmacy   CHL IP FOLLOW UP RECOMMENDATIONS 10/05/2015 Follow up Recommendations Skilled Nursing facility   [facility] IP FREQUENCY AND DURATION 10/05/2015  Speech Therapy Frequency (ACUTE ONLY) min 2x/week Treatment Duration 2 weeks      CHL IP ORAL PHASE 10/05/2015 Oral Phase Impaired Oral - Pudding Teaspoon --  Oral - Pudding Cup -- Oral - Honey Teaspoon Delayed oral transit;Decreased bolus cohesion;Lingual/palatal residue Oral - Honey Cup -- Oral - Nectar Teaspoon Delayed oral transit;Decreased bolus cohesion;Lingual/palatal residue Oral - Nectar Cup Delayed oral transit;Decreased bolus cohesion;Lingual/palatal residue Oral - Nectar Straw -- Oral - Thin Teaspoon -- Oral - Thin Cup Delayed oral transit;Decreased bolus cohesion;Lingual/palatal residue Oral - Thin Straw Delayed oral transit;Decreased bolus cohesion;Lingual/palatal residue Oral - Puree Delayed oral transit;Decreased bolus cohesion;Lingual/palatal residue Oral - Mech Soft -- Oral - Regular Delayed oral transit;Decreased bolus cohesion;Lingual/palatal residue Oral - Multi-Consistency -- Oral - Pill -- Oral Phase - Comment --  CHL IP PHARYNGEAL PHASE 10/05/2015 Pharyngeal Phase Impaired Pharyngeal- Pudding Teaspoon -- Pharyngeal -- Pharyngeal- Pudding Cup -- Pharyngeal -- Pharyngeal- Honey Teaspoon Delayed swallow initiation-vallecula;Pharyngeal residue - pyriform;Pharyngeal residue - valleculae;Inter-arytenoid space residue;Compensatory strategies attempted (with notebox) Pharyngeal -- Pharyngeal- Honey Cup Delayed swallow initiation-vallecula;Pharyngeal residue - pyriform;Pharyngeal residue - valleculae;Inter-arytenoid space residue;Penetration/Aspiration during swallow;Penetration/Apiration after swallow;Reduced tongue base retraction Pharyngeal Material enters airway, remains ABOVE vocal cords and not ejected out Pharyngeal- Nectar Teaspoon Pharyngeal residue - pyriform;Pharyngeal residue - valleculae;Inter-arytenoid space residue;Penetration/Aspiration during swallow;Penetration/Apiration after swallow;Reduced tongue base retraction;Penetration/Aspiration before swallow;Trace aspiration;Delayed swallow initiation-pyriform sinuses;Compensatory strategies attempted (with notebox) Pharyngeal Material enters airway, passes BELOW cords without attempt by patient  to eject out (silent aspiration) Pharyngeal- Nectar Cup Pharyngeal residue - pyriform;Pharyngeal residue - valleculae;Inter-arytenoid space residue;Penetration/Aspiration during swallow;Penetration/Apiration after swallow;Reduced tongue base retraction;Penetration/Aspiration before swallow;Trace aspiration;Delayed swallow initiation-pyriform sinuses Pharyngeal Material enters airway, passes BELOW cords without attempt by patient to eject out (silent aspiration) Pharyngeal- Nectar Straw -- Pharyngeal -- Pharyngeal- Thin Teaspoon -- Pharyngeal -- Pharyngeal- Thin Cup Pharyngeal residue - pyriform;Pharyngeal residue - valleculae;Inter-arytenoid space residue;Penetration/Aspiration during swallow;Penetration/Apiration after swallow;Reduced tongue base retraction;Penetration/Aspiration before swallow;Trace aspiration;Delayed swallow initiation-pyriform sinuses Pharyngeal Material enters airway, passes BELOW cords without attempt by patient to eject out (silent aspiration) Pharyngeal- Thin Straw Pharyngeal residue - pyriform;Pharyngeal residue - valleculae;Inter-arytenoid space residue;Penetration/Aspiration during swallow;Penetration/Apiration after swallow;Reduced tongue base retraction;Penetration/Aspiration before swallow;Trace aspiration;Delayed swallow initiation-pyriform sinuses Pharyngeal Material enters airway, passes BELOW cords without attempt by patient to eject out (silent aspiration) Pharyngeal- Puree Delayed swallow initiation-vallecula;Reduced tongue base retraction;Pharyngeal residue - valleculae;Pharyngeal residue - pyriform;Lateral channel residue Pharyngeal -- Pharyngeal- Mechanical Soft -- Pharyngeal -- Pharyngeal- Regular Reduced tongue base retraction;Pharyngeal residue - valleculae;Pharyngeal residue - pyriform;Lateral channel residue;Delayed swallow initiation-pyriform sinuses Pharyngeal -- Pharyngeal- Multi-consistency -- Pharyngeal -- Pharyngeal- Pill -- Pharyngeal -- Pharyngeal Comment --  No  flowsheet data found. Herbie [place], [place] CCC-SLP [phone] Lynann Beaver 10/05/2015, 2:47 PM               ASSESSMENT/PLAN:    Peripheral edema Pt with c/o bilat lower extremity edema; with the left greater than the right. He feels that the edema is causing increased difficulty ambulating. He denies any chest pain, chest pressure, worsening shortness of breath or pain with inspiration.  On exam.-Patient does have +2 edema to his bilateral lower extremities; with the left slightly greater than the right. There is no calf tenderness on exam.  All pulses are palpable.  Doppler ultrasound of the left lower extremity was negative for DVT.  Patient was advised to elevate his legs above the level of his heart whenever he is resting; and to remain as active as possible.    Small cell lung cancer ([place])  Patient received cycle 1, day 1 of his carboplatin/etoposide chemotherapy regimen on 09/28/2015.  He presented to the [facility] today for cycle 1, day 3 of the etoposide only portion of his chemotherapy.  Patient is complaining of some increased bilateral lower extremity edema.  He notes that his left lower tibia slightly larger than the right.  He denies any calf pain.  He denies any chest pain, chest pressure, or pain with inspiration.  He also denies any recent fevers or chills.  Patient is scheduled to return for labs only on 10/12/2015.  Patient is scheduled for labs, flush, visit, and chemotherapy on the December 20th 2016.  Patient stated understanding of all instructions; and was in agreement with this plan of care. The patient knows to call the clinic with any problems, questions or concerns.   Review/collaboration with Dr. [name] regarding all aspects of patient's visit today.   Total time spent with patient was 25 minutes;  with greater than 75 percent of that time spent in face to face counseling regarding patient's symptoms,  and coordination of care and follow  up.  Disclaimer:This dictation was prepared with Dragon/digital dictation along with [employer]. Any transcriptional errors that result from this process are unintentional.  Drue Second, NP 10/06/2015

## 2015-10-07 ENCOUNTER — Telehealth: Payer: Self-pay | Admitting: *Deleted

## 2015-10-07 LAB — PROTIME-INR
INR: 1.14 (ref 0.00–1.49)
PROTHROMBIN TIME: 14.8 s (ref 11.6–15.2)

## 2015-10-07 LAB — COMPREHENSIVE METABOLIC PANEL
ALBUMIN: 2.4 g/dL — AB (ref 3.5–5.0)
ALT: 24 U/L (ref 17–63)
ANION GAP: 12 (ref 5–15)
AST: 14 U/L — ABNORMAL LOW (ref 15–41)
Alkaline Phosphatase: 53 U/L (ref 38–126)
BILIRUBIN TOTAL: 1.9 mg/dL — AB (ref 0.3–1.2)
BUN: 16 mg/dL (ref 6–20)
CALCIUM: 8.5 mg/dL — AB (ref 8.9–10.3)
CO2: 25 mmol/L (ref 22–32)
Chloride: 85 mmol/L — ABNORMAL LOW (ref 101–111)
Creatinine, Ser: 0.49 mg/dL — ABNORMAL LOW (ref 0.61–1.24)
Glucose, Bld: 90 mg/dL (ref 65–99)
POTASSIUM: 3.4 mmol/L — AB (ref 3.5–5.1)
Sodium: 122 mmol/L — ABNORMAL LOW (ref 135–145)
TOTAL PROTEIN: 5.5 g/dL — AB (ref 6.5–8.1)

## 2015-10-07 LAB — CBC WITH DIFFERENTIAL/PLATELET
BASOS ABS: 0 10*3/uL (ref 0.0–0.1)
BASOS PCT: 0 %
Eosinophils Absolute: 0 10*3/uL (ref 0.0–0.7)
Eosinophils Relative: 0 %
HEMATOCRIT: 24.7 % — AB (ref 39.0–52.0)
Hemoglobin: 9.1 g/dL — ABNORMAL LOW (ref 13.0–17.0)
LYMPHS ABS: 0.1 10*3/uL — AB (ref 0.7–4.0)
Lymphocytes Relative: 43 %
MCH: 31.2 pg (ref 26.0–34.0)
MCHC: 36.8 g/dL — AB (ref 30.0–36.0)
MCV: 84.6 fL (ref 78.0–100.0)
MONOS PCT: 38 %
Monocytes Absolute: 0.1 10*3/uL (ref 0.1–1.0)
NEUTROS ABS: 0 10*3/uL — AB (ref 1.7–7.7)
Neutrophils Relative %: 19 %
Platelets: 27 10*3/uL — CL (ref 150–400)
RBC: 2.92 MIL/uL — ABNORMAL LOW (ref 4.22–5.81)
RDW: 13.2 % (ref 11.5–15.5)
WBC: 0.2 10*3/uL — CL (ref 4.0–10.5)

## 2015-10-07 LAB — T4, FREE: Free T4: 1.29 ng/dL — ABNORMAL HIGH (ref 0.61–1.12)

## 2015-10-07 MED ORDER — VITAMINS A & D EX OINT
TOPICAL_OINTMENT | CUTANEOUS | Status: AC
  Administered 2015-10-07: 5
  Filled 2015-10-07: qty 20

## 2015-10-07 MED ORDER — TBO-FILGRASTIM 300 MCG/0.5ML ~~LOC~~ SOSY
300.0000 ug | PREFILLED_SYRINGE | Freq: Every day | SUBCUTANEOUS | Status: AC
  Administered 2015-10-07: 300 ug via SUBCUTANEOUS
  Filled 2015-10-07: qty 0.5

## 2015-10-07 NOTE — Progress Notes (Signed)
Spencer Long QBH:[id number] DOB: 10/08/1951 DOA: 10/03/2015 PCP: [name], MD  Brief narrative:   64 y/o ? admit 10/03/15 SCLC + brain mets s/p sterotactic XRT Chr hyponatremia HTN Sarcoid GErd Bipolar COPD  Admitted c pancytopenia-WBC 2.2, Hb 12, PLT 131 and concerns for R Basilar infiltrate   Past medical history-As per Problem list Chart reviewed as below- Reviewed  Consultants:  None  Procedures:  None  Antibiotics:  Vancomycin 10/03/15  Zosyn 10/03/15   Subjective   A little bit more responsive Seems to understand what is going on around him No overt pain or discomfort Diet at bedside    Objective    Interim History: None  Telemetry: Sinus tachycardia   Objective: Filed Vitals:   10/07/15 0600 10/07/15 0700 10/07/15 0800 10/07/15 1019  BP: 128/81 135/59  170/70  Pulse: 141 124  133  Temp:   99.3 F (37.4 C)   TempSrc:   Axillary   Resp: 28 22    Height:      Weight:      SpO2: 97% 99%      Intake/Output Summary (Last 24 hours) at 10/07/15 1020 Last data filed at 10/07/15 0500  Gross per 24 hour  Intake   1550 ml  Output   1050 ml  Net    500 ml    Exam:  General: EOMI NCAT, awake now Cardiovascular: Sinus tachycardia Respiratory: Coarse lung sounds bilaterally Abdomen: Soft nontender nondistended no rebound no guarding SkinNo lower extremity edema Neuro Intact to movement   Data Reviewed: Basic Metabolic Panel:  Recent Labs Lab 10/03/15 1801 10/05/15 0520 10/06/15 0500 10/07/15 0517  NA 134* 124* 122* 122*  K 4.5 3.7 3.6 3.4*  CL 101 93* 86* 85*  CO2 '24 24 28 25  '$ GLUCOSE 131* 107* 101* 90  BUN 38* '16 18 16  '$ CREATININE 0.75 0.48* 0.53* 0.49*  CALCIUM 8.0* 8.2* 8.3* 8.5*   Liver Function Tests:  Recent Labs Lab 10/06/15 0500 10/07/15 0517  AST 12* 14*  ALT 27 24  ALKPHOS 53 53  BILITOT 1.4* 1.9*  PROT 5.2* 5.5*  ALBUMIN 2.5* 2.4*   No results for input(s): LIPASE, AMYLASE in the last  168 hours. No results for input(s): AMMONIA in the last 168 hours. CBC:  Recent Labs Lab 10/04/15 1150 10/04/15 1253 10/05/15 1045 10/06/15 0500 10/07/15 0517  WBC 0.2* 0.2* 0.2* 0.1* 0.2*  NEUTROABS 0.1* 0.1* 0.1* 0.0* 0.0*  HGB 10.8* 10.8* 10.3* 9.5* 9.1*  HCT 30.6* 30.9* 28.6* 25.9* 24.7*  MCV 89.5 88.0 87.5 86.0 84.6  PLT 77* 67* 64* 45* 27*   Cardiac Enzymes: No results for input(s): CKTOTAL, CKMB, CKMBINDEX, TROPONINI in the last 168 hours. BNP: Invalid input(s): POCBNP CBG: No results for input(s): GLUCAP in the last 168 hours.  Recent Results (from the past 240 hour(s))  TECHNOLOGIST REVIEW     Status: None   Collection Time: 09/28/15  8:36 AM  Result Value Ref Range Status   Technologist Review rare myelocyte present  Final  Blood Culture (routine x 2)     Status: None (Preliminary result)   Collection Time: 10/03/15  6:30 PM  Result Value Ref Range Status   Specimen Description BLOOD PORTA CATH  Final   Special Requests BOTTLES DRAWN AEROBIC AND ANAEROBIC 5CC  Final   Culture   Final    NO GROWTH 2 DAYS Performed at [facility]    Report Status PENDING  Incomplete  Blood Culture (routine x  2)     Status: None (Preliminary result)   Collection Time: 10/03/15  6:40 PM  Result Value Ref Range Status   Specimen Description BLOOD LEFT ANTECUBITAL  Final   Special Requests BOTTLES DRAWN AEROBIC AND ANAEROBIC 5CC  Final   Culture   Final    NO GROWTH 2 DAYS Performed at [facility]    Report Status PENDING  Incomplete  MRSA PCR Screening     Status: None   Collection Time: 10/03/15  9:14 PM  Result Value Ref Range Status   MRSA by PCR NEGATIVE NEGATIVE Final    Comment:        The GeneXpert MRSA Assay (FDA approved for NASAL specimens only), is one component of a comprehensive MRSA colonization surveillance program. It is not intended to diagnose MRSA infection nor to guide or monitor treatment for MRSA infections.      Studies:                 All Imaging reviewed and is as per above notation   Scheduled Meds: . antiseptic oral rinse  7 mL Mouth Rinse BID  . atenolol  25 mg Oral BID  . buPROPion  150 mg Oral BID  . chlorhexidine  15 mL Mouth Rinse BID  . dexamethasone  4 mg Oral BID  . dextromethorphan-guaiFENesin  1 tablet Oral BID  . feeding supplement (ENSURE ENLIVE)  237 mL Oral Daily  . FLORA-Q  1 capsule Oral Daily  . folic acid  1 mg Oral Daily  . ipratropium  0.5 mg Nebulization TID  . levalbuterol  0.63 mg Nebulization TID  . methylPREDNISolone (SOLU-MEDROL) injection  60 mg Intravenous Daily  . multivitamin with minerals  1 tablet Oral Daily  . nicotine  14 mg Transdermal Daily  . pantoprazole  40 mg Oral Daily  . piperacillin-tazobactam (ZOSYN)  IV  3.375 g Intravenous 3 times per day  . sodium chloride  2 g Oral TID WC  . thiamine  100 mg Oral Daily   Or  . thiamine  100 mg Intravenous Daily  . vancomycin  1,250 mg Intravenous BID   Continuous Infusions: . sodium chloride 50 mL/hr at 10/06/15 1100     Assessment/Plan:  Sepsis secondary to Aspiration pneumonia did have lactic acidosis with tachycardia, tachypnea on admit -Lactic acid has improved, currently 1.8 -Chest x-ray: New patchy bibasilar lung opacities,aspiration  pneumonia. -Continue IV vancomycin and Zosyn -Continue nebulizer treatments as needed, solumedrol, Mucinex -Influenza negative -Urine strep pneumonia antigen negative -Legionella urine antigen pending, blood cultures 12.4.16 no growth so far, sputum culture pending  Acute /chr Resp failure with hypoxia And concerns for aspiration pneumonia -Secondary to pneumonia -Needed BiPAP overnight 12/6 now not requiring it -wean again to face mask -Appreciate palliative care input -Chest x-ray 12/4, 12/6 concerning for aspiration -dysphagia 1 diet -Continue oxygenation to maintain saturations above 92%  Small cell lung cancer with brain metastasis -Diagnosed in October 2015,  s/p stereotactic radiation therapy, 6 cycles of chemotherapy -Last chemotherapy treatment was on 09/30/2015 -Continue Decadron for brain metastasis -Will request input from Dr. Earlie Server  Sarcoidosis -Calcium stable continue breathing treatments as needed  Essential hypertension -Amlodipine, Edarbi held -Continue atenolol, IV hydralazine as needed  Pancytopenia -Likely secondary to chemotherapy -Spoke with Dr. [name], patient Given a dose of Granix 300 mcg on 10/05/15 -ANC is still 0 and plt 60 range--> 27  Chronic hyponatremiaSecondary to lung cancer -Continue sodium chloride tabs  -Saline lock IV 50 cc/h  12/8 has now more awake and alert -Monitor BMP -TSH 0.055 -Start low-dose levothyroxine If free T4, free T3 concordant with Hypothyroidism although I believe that this is probably sick euthyroid  Depression -Continue WellbutrinWhen able to take by mouth  GERD -Continue PPI  Tobacco and alcohol abuse -Patient counseled, continue nicotine patch and CIWA protocol  Goals of care -Patient is DO NOT RESUSCITATE. Wife was concerned about, "when is enough amount". -Palliative care to reengage with patient    Verneita Griffes, MD  [facility] Pager [phone] 10/07/2015, 10:20 AM    LOS: 4 days

## 2015-10-07 NOTE — Progress Notes (Signed)
Patient unable to safely take PO medications at this time, d/t being lethargic.

## 2015-10-07 NOTE — Progress Notes (Signed)
Daily Progress Note   Patient Name: Spencer Long       Date: 10/07/2015 DOB: [birth year]  Age: 64 y.o. MRN#: [id number] Attending Physician: Nita Sells, MD Primary Care Physician: [name], MD Admit Date: 10/03/2015  Reason for Consultation/Follow-up: Establishing goals of care  Subjective: Mr. Vick is more alert today and slightly less distressed. Lungs still sound horrible and he is tachycardic and hypertensive, still a little restless although improved from yesterday. Spoke with [name] today and she is very happy that he is more alert (he did not even know who she was yesterday). She is awaiting her brother to visit today and is pleased that the visit may be more enjoyable to them all now that he is more alert. I cautioned her that he is still very sick and although alert he doesn't appear clinically to be much improved otherwise. She understands but is seeing that this may be the miracle she prayed for to just have him with her a little more today. She also tells me that she is aware that people can "rally" as well. I encouraged her to enjoy this time and good moments as much as possible. Given his alertness she is not prepared to "withdraw medications" to full comfort path at this time although she does say that she would like for him to eventually go to [facility] is this is possible. Understanding that the longer we wait the more likely for hospital death - she does not want him to die at home. Therapeutic listening and emotional support provided. I will be available as needed for any change in status. Continue current care but also give comfort meds as needed.   Length of Stay: 4 days  Current Medications: Scheduled Meds:  . antiseptic oral rinse  7 mL Mouth  Rinse BID  . atenolol  25 mg Oral BID  . buPROPion  150 mg Oral BID  . chlorhexidine  15 mL Mouth Rinse BID  . dexamethasone  4 mg Oral BID  . dextromethorphan-guaiFENesin  1 tablet Oral BID  . feeding supplement (ENSURE ENLIVE)  237 mL Oral Daily  . FLORA-Q  1 capsule Oral Daily  . folic acid  1 mg Oral Daily  . ipratropium  0.5 mg Nebulization TID  . levalbuterol  0.63 mg Nebulization TID  .  methylPREDNISolone (SOLU-MEDROL) injection  60 mg Intravenous Daily  . multivitamin with minerals  1 tablet Oral Daily  . nicotine  14 mg Transdermal Daily  . pantoprazole  40 mg Oral Daily  . piperacillin-tazobactam (ZOSYN)  IV  3.375 g Intravenous 3 times per day  . sodium chloride  2 g Oral TID WC  . Tbo-filgastrim (GRANIX) SQ  300 mcg Subcutaneous q1800  . thiamine  100 mg Oral Daily   Or  . thiamine  100 mg Intravenous Daily  . vancomycin  1,250 mg Intravenous BID    Continuous Infusions:    PRN Meds: hydrALAZINE, levalbuterol, lidocaine-prilocaine, LORazepam, morphine injection, RESOURCE THICKENUP CLEAR, white petrolatum  Physical Exam: Physical Exam  Constitutional: He appears well-developed.  HENT:  Head: Normocephalic.  Cardiovascular: Tachycardia present.   Pulmonary/Chest: No tachypnea. He has wheezes. He has rhonchi.  Abdominal: Normal appearance.  Neurological: He is alert. He is disoriented.  More alert today.                 Vital Signs: BP 170/70 mmHg  Pulse 133  Temp(Src) 99.3 F (37.4 C) (Axillary)  Resp 22  Ht '5\' 9"'$  (1.753 m)  Wt 74.1 kg (163 lb 5.8 oz)  BMI 24.11 kg/m2  SpO2 99% SpO2: SpO2: 99 % O2 Device: O2 Device: NRB O2 Flow Rate: O2 Flow Rate (L/min): 15 L/min  Intake/output summary:   Intake/Output Summary (Last 24 hours) at 10/07/15 1119 Last data filed at 10/07/15 0500  Gross per 24 hour  Intake   1550 ml  Output   1050 ml  Net    500 ml   LBM: Last BM Date: 10/06/15 Baseline Weight: Weight: 71 kg (156 lb 8.4 oz) Most recent weight:  Weight: 74.1 kg (163 lb 5.8 oz)       Palliative Assessment/Data: Flowsheet Rows        Most Recent Value   Intake Tab    Referral Department  Hospitalist   Unit at Time of Referral  ICU   Palliative Care Primary Diagnosis  Cancer   Date Notified  10/05/15   Palliative Care Type  Return patient Palliative Care   Reason for referral  Clarify Goals of Care   Date of Admission  10/03/15   Date first seen by Palliative Care  10/05/15   # of days Palliative referral response time  0 Day(s)   # of days IP prior to Palliative referral  2   Clinical Assessment    Psychosocial & Spiritual Assessment    Palliative Care Outcomes       Additional Data Reviewed: CBC    Component Value Date/Time   WBC 0.2* 10/07/2015 0517   WBC 16.1* 09/28/2015 0836   RBC 2.92* 10/07/2015 0517   RBC 4.31 09/28/2015 0836   HGB 9.1* 10/07/2015 0517   HGB 13.4 09/28/2015 0836   HCT 24.7* 10/07/2015 0517   HCT 40.3 09/28/2015 0836   PLT 27* 10/07/2015 0517   PLT 209 09/28/2015 0836   MCV 84.6 10/07/2015 0517   MCV 93.5 09/28/2015 0836   MCH 31.2 10/07/2015 0517   MCH 31.1 09/28/2015 0836   MCHC 36.8* 10/07/2015 0517   MCHC 33.2 09/28/2015 0836   RDW 13.2 10/07/2015 0517   RDW 14.6 09/28/2015 0836   LYMPHSABS 0.1* 10/07/2015 0517   LYMPHSABS 0.7* 09/28/2015 0836   MONOABS 0.1 10/07/2015 0517   MONOABS 0.9 09/28/2015 0836   EOSABS 0.0 10/07/2015 0517   EOSABS 0.0 09/28/2015 0836   BASOSABS  0.0 10/07/2015 0517   BASOSABS 0.0 09/28/2015 0836    CMP     Component Value Date/Time   NA 122* 10/07/2015 0517   NA 137 09/28/2015 0836   K 3.4* 10/07/2015 0517   K 4.5 09/28/2015 0836   CL 85* 10/07/2015 0517   CO2 25 10/07/2015 0517   CO2 24 09/28/2015 0836   GLUCOSE 90 10/07/2015 0517   GLUCOSE 87 09/28/2015 0836   BUN 16 10/07/2015 0517   BUN 40.8* 09/28/2015 0836   CREATININE 0.49* 10/07/2015 0517   CREATININE 0.8 09/28/2015 0836   CALCIUM 8.5* 10/07/2015 0517   CALCIUM 8.5 09/28/2015  0836   PROT 5.5* 10/07/2015 0517   PROT 5.7* 09/28/2015 0836   ALBUMIN 2.4* 10/07/2015 0517   ALBUMIN 3.3* 09/28/2015 0836   AST 14* 10/07/2015 0517   AST 14 09/28/2015 0836   ALT 24 10/07/2015 0517   ALT 27 09/28/2015 0836   ALKPHOS 53 10/07/2015 0517   ALKPHOS 59 09/28/2015 0836   BILITOT 1.9* 10/07/2015 0517   BILITOT 0.78 09/28/2015 0836   GFRNONAA >60 10/07/2015 0517   GFRAA >60 10/07/2015 0517       Problem List:  Patient Active Problem List   Diagnosis Date Noted  . Peripheral edema 10/06/2015  . HCAP (healthcare-associated pneumonia) 10/03/2015  . Acute on chronic respiratory failure with hypoxia ([place]) 10/03/2015  . Sepsis ([place]) 10/03/2015  . Pancytopenia ([place]) 10/03/2015  . Alcohol abuse 10/03/2015  . Brain metastases ([place])   . Palliative care encounter   . Hyponatremia 09/06/2015  . Brain metastasis ([place]) 09/06/2015  . Acute encephalopathy 09/06/2015  . Depression 09/06/2015  . Essential hypertension 09/06/2015  . DNR no code (do not resuscitate) 08/02/2015  . COPD (chronic obstructive pulmonary disease) ([place]) 05/21/2015  . Sarcoidosis ([place]) 03/25/2015  . Small cell lung cancer ([place]) 08/31/2014  . History of tobacco abuse 08/11/2014     Palliative Care Assessment & Plan    1.Code Status:  DNR    Code Status Orders        Start     Ordered   10/03/15 2009  Do not attempt resuscitation (DNR)   Continuous    Question Answer Comment  In the event of cardiac or respiratory ARREST Do not call a "code blue"   In the event of cardiac or respiratory ARREST Do not perform Intubation, CPR, defibrillation or ACLS   In the event of cardiac or respiratory ARREST Use medication by any route, position, wound care, and other measures to relive pain and suffering. May use oxygen, suction and manual treatment of airway obstruction as needed for comfort.      10/03/15 2010    Advance Directive Documentation        Most Recent Value   Type of Advance Directive   Healthcare Power of Attorney   Pre-existing out of facility DNR order (yellow form or pink MOST form)     "MOST" Form in Place?         2. Goals of Care/Additional Recommendations:  Comfort focus. Continue current therapies. Avoid BiPAP if possible but okay for trial. Continue current therapies.   Limitations on Scope of Treatment: No Artificial Feeding  Desire for further Chaplaincy support:no  Psycho-social Needs: Caregiving  Support/Resources, Education on Hospice, Funeral Planning/Counseling and Grief/Bereavement Support  3. Symptom Management:      1. SOB/dyspnea: Morphine 1-2 mg every 3 hours. MAY be increased dosage/frequency to achieve comfort.       2. Anxiety/Agitation:  Lorazepam 1 mg every 4 hours.   4. Palliative Prophylaxis:   Bowel Regimen, Delirium Protocol and Frequent Pain Assessment  5. Prognosis: Hours - Days  6. Discharge Planning:  Hospital death vs hospice facility    Thank you for allowing the Palliative Medicine Team to assist in the care of this patient.   Time In: 0810 Time Out: 0845 Total Time 35 min Prolonged Time Billed  no        Pershing Proud, NP  29/01/7653, 11:19 AM  Please contact Palliative Medicine Team phone at [phone] for questions and concerns.

## 2015-10-07 NOTE — Telephone Encounter (Signed)
Oncology Nurse Navigator Documentation  Oncology Nurse Navigator Flowsheets 10/07/2015  Navigator Encounter Type Telephone/I received a vm message and she had questions regarding next steps.  I came to unit to see patient and wife.  Wife was not here so I caller her.  I told her I spoke with palliative care and tomorrow will be decisions for comfort care. She was thankful for all the help.    Patient Visit Type Follow-up  Treatment Phase Other  Interventions Coordination of Care  Time Spent with Patient 30

## 2015-10-07 NOTE — Progress Notes (Signed)
CSW is available to assist with d/c planning. PN reviewed. Palliative Care Team is following / providing support. CSW will assist with residential hospice home placement if recommended. If SNF placement is considered, pt will need PT recommendations for [facility] authorization, for [facility].  CSW can assist with pvt pay SNF placement, if requested.  Werner Lean LCSW [phone]

## 2015-10-07 NOTE — Progress Notes (Signed)
Hydralazine 5 mg iv at this time.    10/07/15 0500  Vitals  BP (!) 173/97 mmHg  MAP (mmHg) 112  Pulse Rate (!) 125  ECG Heart Rate (!) 126  Resp (!) 31  Oxygen Therapy  SpO2 94 %

## 2015-10-07 NOTE — Progress Notes (Signed)
OT Cancellation Note  Patient Details Name: PAYTON PRINSEN MRN: [id number] DOB: Apr 27, 1951   Cancelled Treatment:    Reason Eval/Treat Not Completed: Other (comment).  Pt is not appropriate for OT at this time:  On NRB and restless/restraints.    Christianna Belmonte 10/07/2015, 7:39 AM  Lesle Chris, OTR/L [phone] 10/07/2015

## 2015-10-07 NOTE — Progress Notes (Signed)
Speech Language Pathology Treatment: Dysphagia  Patient Details Name: Spencer Long MRN: [id number] DOB: [birth year] Today's Date: 10/07/2015 Time: 1530-1600 SLP Time Calculation (min) (ACUTE ONLY): 30 min  Assessment / Plan / Recommendation Clinical Impression  Pt seen with wife at bedside during pm meal. SLP provided verbal cues for pt to clear standing pharyngeal secretions. As POs were provided wet vocal quality and pharyngeal stasis increased despite max verbal and tactile cues for second swallow and application of suction.  Demonstrated strategies with wife.  Pt clearly frustrated by total assist feeding and precautions. Pt is continuing to aspirate POs despite modified diet and precautions and discussed this directly with pts wife who was aware and could see that pt is not tolerating diet. We discussed advancing pt to comfort feeds, but still she would like pt to continue with thickened liquids for now to reduce quantity of aspirate in the hopes that pt may stay alert to visit with family. We did discuss advancing his solids to mechanical soft for greater variety and pleasure with foods. Pt is able to masticate and pureed texture was not particularly benefitting pt, so diet upgraded. Will follow as needed.    HPI HPI: Spencer Long is a 64 y.o. male with PMH of small cell lung cancer with brain metastasis (s/p of stereotactic radiation therapy, currently on chemotherapy), chronic hyponatremia on salt tablets, HTN, sarcoidosis, gerd, depression, former smoker, COPD, who presents with cough and shortness of breath. Patient reports that he had chemotherapy on 09/30/15. After that, he started having SOB and cough, which have been progressively getting worse. He coughs up whitish colored mucus. Chest x-ray showed patchy right basilar infiltration.      SLP Plan  Continue with current plan of care     Recommendations  Diet recommendations: Dysphagia 3 (mechanical soft);Honey-thick  liquid Liquids provided via: Teaspoon Medication Administration: Whole meds with puree Supervision: Full supervision/cueing for compensatory strategies Compensations: Minimize environmental distractions;Multiple dry swallows after each bite/sip;Clear throat intermittently              Oral Care Recommendations: Oral care BID Follow up Recommendations: 24 hour supervision/assistance Plan: Continue with current plan of care  [facility], MA CCC-SLP [phone]  Lynann Beaver 10/07/2015, 4:03 PM

## 2015-10-07 NOTE — Progress Notes (Signed)
CRITICAL VALUE ALERT  Critical value received: PLT count    Date of notification:  10/07/15  Time of notification:  0605 am   Critical value read back yes    Nurse who received alert:  BMay RN   MD notified (1st page):  Baltazar Najjar NP   Time of first page:  0610  MD notified (2nd page):  Time of second page:   Responding MD:   Time MD responded:

## 2015-10-07 NOTE — Progress Notes (Signed)
Pt currently off BIPAP. CPT done with no complications.

## 2015-10-07 NOTE — Progress Notes (Signed)
Date: October 07, 2015 Chart reviewed for concurrent status and case management needs. Will continue to follow patient for changes and needs:  Remains on nbr mask at fi02 at 100% Velva Harman, RN, [place], [place]   [phone]

## 2015-10-08 LAB — CBC WITH DIFFERENTIAL/PLATELET
BASOS ABS: 0 10*3/uL (ref 0.0–0.1)
BASOS PCT: 0 %
Eosinophils Absolute: 0 10*3/uL (ref 0.0–0.7)
Eosinophils Relative: 0 %
HEMATOCRIT: 24.3 % — AB (ref 39.0–52.0)
HEMOGLOBIN: 8.8 g/dL — AB (ref 13.0–17.0)
LYMPHS PCT: 46 %
Lymphs Abs: 0.3 10*3/uL — ABNORMAL LOW (ref 0.7–4.0)
MCH: 30.9 pg (ref 26.0–34.0)
MCHC: 36.2 g/dL — AB (ref 30.0–36.0)
MCV: 85.3 fL (ref 78.0–100.0)
MONOS PCT: 26 %
Monocytes Absolute: 0.1 10*3/uL (ref 0.1–1.0)
NEUTROS ABS: 0.1 10*3/uL — AB (ref 1.7–7.7)
NEUTROS PCT: 28 %
Platelets: 25 10*3/uL — CL (ref 150–400)
RBC: 2.85 MIL/uL — ABNORMAL LOW (ref 4.22–5.81)
RDW: 13.4 % (ref 11.5–15.5)
WBC: 0.5 10*3/uL — CL (ref 4.0–10.5)

## 2015-10-08 LAB — COMPREHENSIVE METABOLIC PANEL
ALBUMIN: 2.6 g/dL — AB (ref 3.5–5.0)
ALK PHOS: 61 U/L (ref 38–126)
ALT: 28 U/L (ref 17–63)
ANION GAP: 12 (ref 5–15)
AST: 16 U/L (ref 15–41)
BILIRUBIN TOTAL: 2.4 mg/dL — AB (ref 0.3–1.2)
BUN: 23 mg/dL — ABNORMAL HIGH (ref 6–20)
CALCIUM: 8.7 mg/dL — AB (ref 8.9–10.3)
CO2: 26 mmol/L (ref 22–32)
CREATININE: 0.48 mg/dL — AB (ref 0.61–1.24)
Chloride: 90 mmol/L — ABNORMAL LOW (ref 101–111)
GFR calc Af Amer: >60 mL/min (ref >60)
GFR calc non Af Amer: >60 mL/min (ref >60)
GLUCOSE: 121 mg/dL — AB (ref 65–99)
Potassium: 3.3 mmol/L — ABNORMAL LOW (ref 3.5–5.1)
SODIUM: 128 mmol/L — AB (ref 135–145)
TOTAL PROTEIN: 5.8 g/dL — AB (ref 6.5–8.1)

## 2015-10-08 LAB — PROTIME-INR
INR: 1.12 (ref 0.00–1.49)
Prothrombin Time: 14.5 seconds (ref 11.6–15.2)

## 2015-10-08 LAB — T3, FREE: T3, Free: 1.3 pg/mL — ABNORMAL LOW (ref 2.0–4.4)

## 2015-10-08 MED ORDER — LORAZEPAM 2 MG/ML PO CONC: 1.0000 mg | ORAL | Status: AC | PRN

## 2015-10-08 MED ORDER — IPRATROPIUM BROMIDE 0.02 % IN SOLN
0.5000 mg | Freq: Four times a day (QID) | RESPIRATORY_TRACT | Status: DC
  Administered 2015-10-08 (×2): 0.5 mg via RESPIRATORY_TRACT
  Filled 2015-10-08 (×2): qty 2.5

## 2015-10-08 MED ORDER — MORPHINE SULFATE (CONCENTRATE) 10 MG /0.5 ML PO SOLN: 10.0000 mg | ORAL | Status: AC | PRN

## 2015-10-08 MED ORDER — LEVALBUTEROL HCL 0.63 MG/3ML IN NEBU
0.6300 mg | INHALATION_SOLUTION | Freq: Four times a day (QID) | RESPIRATORY_TRACT | Status: DC
  Administered 2015-10-08 (×2): 0.63 mg via RESPIRATORY_TRACT
  Filled 2015-10-08 (×2): qty 3

## 2015-10-08 NOTE — Discharge Summary (Signed)
Physician Discharge Summary  Spencer Long NGE:[id number] DOB: 1951-01-17 DOA: 10/03/2015  PCP: [name], MD  Admit date: 10/03/2015 Discharge date: 10/08/2015  Time spent: 35 minutes  Recommendations for Outpatient Follow-up:  1. Patient to transfer to [facility] for EOL and Hopsice care-prognosis is gaurded  2. Wil need Face Mask oxygen as is a mouth breather 3. Limied Rx for Opiates and ativan prescribed  Discharge Diagnoses:  Principal Problem:   HCAP (healthcare-associated pneumonia) Active Problems:   Small cell lung cancer ([place])   Sarcoidosis ([place])   COPD (chronic obstructive pulmonary disease) ([place])   Brain metastasis ([place])   Depression   Essential hypertension   Palliative care encounter   Acute on chronic respiratory failure with hypoxia ([place])   Sepsis ([place])   Pancytopenia ([place])   Alcohol abuse   Discharge Condition: gaurded  Diet recommendation: comfort  Filed Weights   10/04/15 0700 10/05/15 0438  Weight: 71 kg (156 lb 8.4 oz) 74.1 kg (163 lb 5.8 oz)    History of present illness:  64 y/o ? admit 10/03/15 SCLC + brain mets s/p sterotactic XRT Chr hyponatremia HTN Sarcoid GErd Bipolar COPD  Admitted c pancytopenia-WBC 2.2, Hb 12, PLT 131 and concerns for R Basilar infiltrate  Hospital Course:  Sepsis secondary to Aspiration pneumonia did have lactic acidosis with tachycardia, tachypnea on admit -Lactic acid has improved, currently 1.8 -Chest x-ray: New patchy bibasilar lung opacities,aspiration pneumonia. -de-escalated off of IV vancomycin and Zosyn -Influenza negative -Urine strep pneumonia antigen negative -Legionella urine antigen pending, blood cultures 12.4.16 no growth   Acute /chr Resp failure with hypoxia And concerns for aspiration pneumonia -Secondary to pneumonia -Needed BiPAP intermittently -wean again to face mask -Appreciate palliative care input  Small cell lung cancer with brain metastasis -Diagnosed in  October 2015, s/p stereotactic radiation therapy, 6 cycles of chemotherapy -Last chemotherapy treatment was on 09/30/2015 -Continue Decadron for brain metastasis -follow prn with Dr. Lovenia Kim further Role Chemo/XRT  Essential hypertension -Amlodipine, Edarbi held -Continue atenolol  Pancytopenia -Likely secondary to chemotherapy -Spoke with Dr. [name], patient Given a dose of Granix 300 mcg on 10/05/15 -[place] is still 0 and plt 60 range--> 27 -stop Granix and othe rlife prolonging measures  Chronic hyponatremiaSecondary to lung cancer -initially was on chloride tabs  Consultations:  ONcology  Pallative  Discharge Exam: Filed Vitals:   10/08/15 0822 10/08/15 1200  BP: 165/70 146/78  Pulse: 105 107  Temp:    Resp: 25 17    General: alert moaning incomprehensible Cardiovascular: s1 s2 no m/r/g Respiratory:  clear  Discharge Instructions   Discharge Instructions    Diet - low sodium heart healthy    Complete by:  As directed      Increase activity slowly    Complete by:  As directed           Current Discharge Medication List    START taking these medications   Details  LORazepam (ATIVAN) 2 MG/ML concentrated solution Take 0.5 mLs (1 mg total) by mouth every 4 (four) hours as needed for anxiety. Qty: 30 mL, Refills: 0    Morphine Sulfate (MORPHINE CONCENTRATE) 10 mg / 0.5 ml concentrated solution Take 0.5 mLs (10 mg total) by mouth every 3 (three) hours as needed for severe pain. Qty: 30 mL, Refills: 0      CONTINUE these medications which have NOT CHANGED   Details  atenolol (TENORMIN) 25 MG tablet Take 25 mg by mouth 2 (two) times daily.  dexamethasone (DECADRON) 4 MG tablet Take 1 tablet (4 mg total) by mouth 4 (four) times daily. Qty: 120 tablet, Refills: 0    Tiotropium Bromide Monohydrate (SPIRIVA RESPIMAT) 1.25 MCG/ACT AERS Inhale 2 puffs into the lungs daily.    Albuterol Sulfate (PROAIR RESPICLICK) 893 (90 BASE) MCG/ACT AEPB Inhale 2 puffs  into the lungs every 4 (four) hours as needed. Qty: 1 each, Refills: 2    furosemide (LASIX) 20 MG tablet Take 1 tablet (20 mg total) by mouth daily. Qty: 30 tablet, Refills: 0      STOP taking these medications     amLODipine (NORVASC) 10 MG tablet      buPROPion (WELLBUTRIN SR) 150 MG 12 hr tablet      EDARBI 80 MG TABS      feeding supplement, ENSURE ENLIVE, (ENSURE ENLIVE) LIQD      lidocaine-prilocaine (EMLA) cream      Multiple Vitamins-Minerals (MULTIVITAMIN ADULTS 50+ PO)      nicotine (NICODERM CQ - DOSED IN MG/24 HOURS) 14 mg/24hr patch      omeprazole (PRILOSEC) 40 MG capsule      Probiotic Product (PROBIOTIC DAILY PO)      prochlorperazine (COMPAZINE) 10 MG tablet      sodium chloride 1 G tablet      white petrolatum (VASELINE) GEL        Allergies  Allergen Reactions  . Varenicline     Chantix - caused strange behavior  . Isothiazolinone Chloride Rash  . Oxycodone Rash  . Quaternium-15 Rash      The results of significant diagnostics from this hospitalization (including imaging, microbiology, ancillary and laboratory) are listed below for reference.    Significant Diagnostic Studies: Dg Chest Port 1 View  10/05/2015  CLINICAL DATA:  Hypoxia. EXAM: PORTABLE CHEST 1 VIEW COMPARISON:  October 03, 2015. FINDINGS: The heart size and mediastinal contours are within normal limits. No pneumothorax or pleural effusion is noted. Right internal jugular Port-A-Cath is again noted with distal tip in expected position of the SVC. Left lung is clear. Mild right basilar opacity is noted concerning for possible pneumonia or subsegmental atelectasis. The visualized skeletal structures are unremarkable. IMPRESSION: Mild right basilar opacity concerning for pneumonia or subsegmental atelectasis. Electronically Signed   By: Marijo Conception, M.D.   On: 10/05/2015 20:29   Dg Chest Port 1 View  10/03/2015  CLINICAL DATA:  Shortness of breath.  Lung cancer. EXAM: PORTABLE  CHEST 1 VIEW COMPARISON:  09/06/2015 chest radiograph. FINDINGS: Right internal jugular MediPort terminates in the lower third of the superior vena cava. Stable cardiomediastinal silhouette with normal heart size. No pneumothorax. No pleural effusion. New patchy opacities are present at both lung bases. IMPRESSION: New patchy bibasilar lung opacities, favor aspiration or pneumonia. A component of tumor progression cannot be excluded. Electronically Signed   By: Ilona Sorrel M.D.   On: 10/03/2015 17:59   Dg Swallowing Func-speech Pathology  10/05/2015  Objective Swallowing Evaluation:   Patient Details Name: Spencer Long MRN: [id number] Date of Birth: Dec 11, 1950 Today's Date: 10/05/2015 Time: SLP Start Time (ACUTE ONLY): 1400-SLP Stop Time (ACUTE ONLY): 1420 SLP Time Calculation (min) (ACUTE ONLY): 20 min Past Medical History: Past Medical History Diagnosis Date . Hypertension    under control, has been on med. x 15-20 yrs. [name] GERD (gastroesophageal reflux disease)  . Infection of elbow 2011   right-no infection at present , but has skin lesions present . Sarcoidosis ([place])    "tends to have  skin flareups" and presently experiencing now"feet, legs and arms" . Chronic hyponatremia    Dr. Buddy Duty follows . Radiation 02/11/15-02/25/15   brain 25 Gy . DNR no code (do not resuscitate) 08/02/2015 . Lung cancer ([place])  . Brain metastases ([place])  . COPD (chronic obstructive pulmonary disease) ([place])  . Depression  Past Surgical History: Past Surgical History Procedure Laterality Date . Cholecystectomy  05/2006 . Ulnar nerve repair  01/2008   right; decompression . Carpal tunnel release  01/2008   right . Eye surgery  12/2008   correction ectropion, release band, wedge exc., shortening . I&d extremity  09/12/2011   Procedure: IRRIGATION AND DEBRIDEMENT EXTREMITY;  Surgeon: Tennis Must;  Location: [facility];  Service: Orthopedics;  Laterality: Right;  I&d RIGHT OLECRANON BURSSA  . Tonsillectomy   . Endobronchial  ultrasound Bilateral 08/24/2014   Procedure: ENDOBRONCHIAL ULTRASOUND;  Surgeon: Collene Gobble, MD;  Location: [facility];  Service: Cardiopulmonary;  Laterality: Bilateral; HPI: DAMETRIUS SANJUAN is a 64 y.o. male with PMH of small cell lung cancer with brain metastasis (s/p of stereotactic radiation therapy, currently on chemotherapy), chronic hyponatremia on salt tablets, HTN, sarcoidosis, gerd, depression, former smoker, COPD, who presents with cough and shortness of breath. Patient reports that he had chemotherapy on 09/30/15. After that, he started having SOB and cough, which have been progressively getting worse. He coughs up whitish colored mucus. Chest x-ray showed patchy right basilar infiltration. No Data Recorded Assessment / Plan / Recommendation CHL IP CLINICAL IMPRESSIONS 10/05/2015 Therapy Diagnosis Moderate pharyngeal phase dysphagia;Severe pharyngeal phase dysphagia Clinical Impression Pt demonstrates a mild oral dysphagia with oral residuals and reduced bolus cohesion with significant premature spillage of solids to pharynx. Oropharyngeal phase initially appeared normal, but becaome progressively worse as study progressed though pt did not appear significantly fatigued. There was increased delay in swallow, decreased laryngeal closure and increased residuals leading to silent penetration and aspiration events with all methods with thin and nectar thick liquids. Chin tuck ineffective Only honey teaspoon, puree and solids were tolerated without penetration. Recommend pt initaite a Dys 1 (puree) honey thick diet with supervision for second swallows. Suspect that even with this restrictive diet pt will have silent aspiration events and that pt may have vocal fold abnormality interfering with airway protection. SLP will follow for tolerance.  Impact on safety and function Moderate aspiration risk   CHL IP TREATMENT RECOMMENDATION 10/05/2015 Treatment Recommendations Therapy as outlined in treatment plan  below   Prognosis 10/05/2015 Prognosis for Safe Diet Advancement Guarded Barriers to Reach Goals Cognitive deficits Barriers/Prognosis Comment -- CHL IP DIET RECOMMENDATION 10/05/2015 SLP Diet Recommendations Dysphagia 1 (Puree) solids;Honey thick liquids Liquid Administration via Spoon Medication Administration Whole meds with puree Compensations Minimize environmental distractions;Multiple dry swallows after each bite/sip;Clear throat intermittently Postural Changes Seated upright at 90 degrees   CHL IP OTHER RECOMMENDATIONS 10/05/2015 Recommended Consults -- Oral Care Recommendations Oral care BID Other Recommendations Order thickener from pharmacy   CHL IP FOLLOW UP RECOMMENDATIONS 10/05/2015 Follow up Recommendations Skilled Nursing facility   [facility] IP FREQUENCY AND DURATION 10/05/2015 Speech Therapy Frequency (ACUTE ONLY) min 2x/week Treatment Duration 2 weeks      CHL IP ORAL PHASE 10/05/2015 Oral Phase Impaired Oral - Pudding Teaspoon -- Oral - Pudding Cup -- Oral - Honey Teaspoon Delayed oral transit;Decreased bolus cohesion;Lingual/palatal residue Oral - Honey Cup -- Oral - Nectar Teaspoon Delayed oral transit;Decreased bolus cohesion;Lingual/palatal residue Oral - Nectar Cup Delayed oral transit;Decreased bolus cohesion;Lingual/palatal residue Oral -  Nectar Straw -- Oral - Thin Teaspoon -- Oral - Thin Cup Delayed oral transit;Decreased bolus cohesion;Lingual/palatal residue Oral - Thin Straw Delayed oral transit;Decreased bolus cohesion;Lingual/palatal residue Oral - Puree Delayed oral transit;Decreased bolus cohesion;Lingual/palatal residue Oral - Mech Soft -- Oral - Regular Delayed oral transit;Decreased bolus cohesion;Lingual/palatal residue Oral - Multi-Consistency -- Oral - Pill -- Oral Phase - Comment --  CHL IP PHARYNGEAL PHASE 10/05/2015 Pharyngeal Phase Impaired Pharyngeal- Pudding Teaspoon -- Pharyngeal -- Pharyngeal- Pudding Cup -- Pharyngeal -- Pharyngeal- Honey Teaspoon Delayed swallow  initiation-vallecula;Pharyngeal residue - pyriform;Pharyngeal residue - valleculae;Inter-arytenoid space residue;Compensatory strategies attempted (with notebox) Pharyngeal -- Pharyngeal- Honey Cup Delayed swallow initiation-vallecula;Pharyngeal residue - pyriform;Pharyngeal residue - valleculae;Inter-arytenoid space residue;Penetration/Aspiration during swallow;Penetration/Apiration after swallow;Reduced tongue base retraction Pharyngeal Material enters airway, remains ABOVE vocal cords and not ejected out Pharyngeal- Nectar Teaspoon Pharyngeal residue - pyriform;Pharyngeal residue - valleculae;Inter-arytenoid space residue;Penetration/Aspiration during swallow;Penetration/Apiration after swallow;Reduced tongue base retraction;Penetration/Aspiration before swallow;Trace aspiration;Delayed swallow initiation-pyriform sinuses;Compensatory strategies attempted (with notebox) Pharyngeal Material enters airway, passes BELOW cords without attempt by patient to eject out (silent aspiration) Pharyngeal- Nectar Cup Pharyngeal residue - pyriform;Pharyngeal residue - valleculae;Inter-arytenoid space residue;Penetration/Aspiration during swallow;Penetration/Apiration after swallow;Reduced tongue base retraction;Penetration/Aspiration before swallow;Trace aspiration;Delayed swallow initiation-pyriform sinuses Pharyngeal Material enters airway, passes BELOW cords without attempt by patient to eject out (silent aspiration) Pharyngeal- Nectar Straw -- Pharyngeal -- Pharyngeal- Thin Teaspoon -- Pharyngeal -- Pharyngeal- Thin Cup Pharyngeal residue - pyriform;Pharyngeal residue - valleculae;Inter-arytenoid space residue;Penetration/Aspiration during swallow;Penetration/Apiration after swallow;Reduced tongue base retraction;Penetration/Aspiration before swallow;Trace aspiration;Delayed swallow initiation-pyriform sinuses Pharyngeal Material enters airway, passes BELOW cords without attempt by patient to eject out (silent aspiration)  Pharyngeal- Thin Straw Pharyngeal residue - pyriform;Pharyngeal residue - valleculae;Inter-arytenoid space residue;Penetration/Aspiration during swallow;Penetration/Apiration after swallow;Reduced tongue base retraction;Penetration/Aspiration before swallow;Trace aspiration;Delayed swallow initiation-pyriform sinuses Pharyngeal Material enters airway, passes BELOW cords without attempt by patient to eject out (silent aspiration) Pharyngeal- Puree Delayed swallow initiation-vallecula;Reduced tongue base retraction;Pharyngeal residue - valleculae;Pharyngeal residue - pyriform;Lateral channel residue Pharyngeal -- Pharyngeal- Mechanical Soft -- Pharyngeal -- Pharyngeal- Regular Reduced tongue base retraction;Pharyngeal residue - valleculae;Pharyngeal residue - pyriform;Lateral channel residue;Delayed swallow initiation-pyriform sinuses Pharyngeal -- Pharyngeal- Multi-consistency -- Pharyngeal -- Pharyngeal- Pill -- Pharyngeal -- Pharyngeal Comment --  No flowsheet data found. Herbie [place], [place] CCC-SLP [phone] Othelia Pulling Katherene Ponto 10/05/2015, 2:47 PM               Microbiology: Recent Results (from the past 240 hour(s))  Blood Culture (routine x 2)     Status: None (Preliminary result)   Collection Time: 10/03/15  6:30 PM  Result Value Ref Range Status   Specimen Description BLOOD PORTA CATH  Final   Special Requests BOTTLES DRAWN AEROBIC AND ANAEROBIC 5CC  Final   Culture   Final    NO GROWTH 4 DAYS Performed at [facility]    Report Status PENDING  Incomplete  Blood Culture (routine x 2)     Status: None (Preliminary result)   Collection Time: 10/03/15  6:40 PM  Result Value Ref Range Status   Specimen Description BLOOD LEFT ANTECUBITAL  Final   Special Requests BOTTLES DRAWN AEROBIC AND ANAEROBIC 5CC  Final   Culture   Final    NO GROWTH 4 DAYS Performed at [facility]    Report Status PENDING  Incomplete  MRSA PCR Screening     Status: None   Collection Time: 10/03/15   9:14 PM  Result Value Ref Range Status   MRSA by PCR NEGATIVE NEGATIVE Final  Comment:        The GeneXpert MRSA Assay (FDA approved for NASAL specimens only), is one component of a comprehensive MRSA colonization surveillance program. It is not intended to diagnose MRSA infection nor to guide or monitor treatment for MRSA infections.      Labs: Basic Metabolic Panel:  Recent Labs Lab 10/03/15 1801 10/05/15 0520 10/06/15 0500 10/07/15 0517 10/08/15 0545  NA 134* 124* 122* 122* 128*  K 4.5 3.7 3.6 3.4* 3.3*  CL 101 93* 86* 85* 90*  CO2 '24 24 28 25 26  '$ GLUCOSE 131* 107* 101* 90 121*  BUN 38* '16 18 16 '$ 23*  CREATININE 0.75 0.48* 0.53* 0.49* 0.48*  CALCIUM 8.0* 8.2* 8.3* 8.5* 8.7*   Liver Function Tests:  Recent Labs Lab 10/06/15 0500 10/07/15 0517 10/08/15 0545  AST 12* 14* 16  ALT '27 24 28  '$ ALKPHOS 53 53 61  BILITOT 1.4* 1.9* 2.4*  PROT 5.2* 5.5* 5.8*  ALBUMIN 2.5* 2.4* 2.6*   No results for input(s): LIPASE, AMYLASE in the last 168 hours. No results for input(s): AMMONIA in the last 168 hours. CBC:  Recent Labs Lab 10/04/15 1253 10/05/15 1045 10/06/15 0500 10/07/15 0517 10/08/15 0545  WBC 0.2* 0.2* 0.1* 0.2* 0.5*  NEUTROABS 0.1* 0.1* 0.0* 0.0* 0.1*  HGB 10.8* 10.3* 9.5* 9.1* 8.8*  HCT 30.9* 28.6* 25.9* 24.7* 24.3*  MCV 88.0 87.5 86.0 84.6 85.3  PLT 67* 64* 45* 27* 25*   Cardiac Enzymes: No results for input(s): CKTOTAL, CKMB, CKMBINDEX, TROPONINI in the last 168 hours. BNP: BNP (last 3 results)  Recent Labs  10/03/15 1846  BNP 69.8    ProBNP (last 3 results) No results for input(s): PROBNP in the last 8760 hours.  CBG: No results for input(s): GLUCAP in the last 168 hours.     SignedNita Sells  [facility] 10/08/2015, 12:46 PM

## 2015-10-08 NOTE — Progress Notes (Signed)
[facility] picked up pt and transporting to [facility].

## 2015-10-08 NOTE — Clinical Social Work Note (Signed)
Clinical Social Work Assessment  Patient Details  Name: Spencer Long MRN: [id number] Date of Birth: 11-Dec-1950  Date of referral:  10/08/15               Reason for consult:  Discharge Planning, Facility Placement                Permission sought to share information with:  Facility Art therapist granted to share information::     Name::        Agency::     Relationship::     Contact Information:     Housing/Transportation Living arrangements for the past 2 months:  [place], [place] of Information:  Spouse Patient Interpreter Needed:  None Criminal Activity/Legal Involvement Pertinent to Current Situation/Hospitalization:  No - Comment as needed Significant Relationships:  Spouse Lives with:  Spouse Do you feel safe going back to the place where you live?   (Residential Hospice Home needed.) Need for family participation in patient care:  Yes (Comment)  Care giving concerns:  Pt's care cannot be managed at ome following hospital d/c.   Social Worker assessment / plan: Pt hospitalized on 10/03/15 with [facility]. Pt had been recently d/c from [facility] to home. CSW met with pt's spouse to assist with d/c planning. Palliative Care Team ad met with pt / spouse to offer support an [place]. Residential Hospice Home placement had been recommended and accepted. Pt / spouse had cosen [facility] for placement. [facility] has accepted pt and pt will transfer there today. Pt / spouse are in agreement with this plan. PTAR transport is required and medical necessity form has been completed. NSG as reviewed d/c summary, avs , scripts. Scripts included in d/c packet. D/c summary sent to [facility] for review prior to d/c.  Employment status:  Retired Nurse, adult PT Recommendations:  Not assessed at this time Information / Referral to community resources:     Patient/Family's Response to care: Pt / spouse have  accepted [facility] placement.  Patient/Family's Understanding of and Emotional Response to Diagnosis, Current Treatment, and Prognosis: Pt / spouse are aware of pt's medical status. Palliative Care Team has been providing support. Pt / spouse are accepting end of life care.  Emotional Assessment Appearance:  Appears stated age Attitude/Demeanor/Rapport:  Other (restless at times) Affect (typically observed):  Anxious Orientation:  Oriented to Self Alcohol / Substance use:  Not Applicable Psych involvement (Current and /or in the community):  No (Comment)  Discharge Needs  Concerns to be addressed:  Discharge Planning Concerns Readmission within the last 30 days:  Yes Current discharge risk:  None Barriers to Discharge:  No Barriers Identified   Luretha Rued, [place] 10/08/2015, 3:05 PM

## 2015-10-08 NOTE — Progress Notes (Signed)
Report called and given to [facility] at [facility]. All questions and concerns were addressed, pt remains on Partial NRB, VSS, redness to perineal area with small open abrasion on top of penis, all other skin intact and no other breakdown present, although very red and irritated. Pt wife took all of his belongings home with her and will meet pt at the facility.

## 2015-10-08 NOTE — Consult Note (Signed)
[facility] room available for Mr. Gonder today. Met with spouse at length to confirm interest, answer questions and offer support. She spoke with her brother and Dr. Alyson Ingles to assist with decision. Family from out of town arriving today and over the weekend. Dr. Alyson Ingles to be involved at [facility]. Discharge summary has been faxed.  RN please call report to [phone].  Thank you. Erling Conte, [place]

## 2015-10-08 NOTE — Progress Notes (Signed)
Inserted 83F indwelling foley catheter immediately emptied 2550cc of clear yellow urine. Pt seems to be more relaxed now and tremors and fidgeting have stopped. Will continue to monitor.

## 2015-10-09 LAB — CULTURE, BLOOD (ROUTINE X 2)
CULTURE: NO GROWTH
Culture: NO GROWTH

## 2015-10-11 ENCOUNTER — Ambulatory Visit: Payer: Commercial Managed Care - HMO | Admitting: Internal Medicine

## 2015-10-11 ENCOUNTER — Ambulatory Visit: Payer: Commercial Managed Care - HMO

## 2015-10-11 ENCOUNTER — Encounter: Payer: Self-pay | Admitting: *Deleted

## 2015-10-11 ENCOUNTER — Other Ambulatory Visit: Payer: Commercial Managed Care - HMO

## 2015-10-11 ENCOUNTER — Encounter: Payer: Self-pay | Admitting: Radiation Therapy

## 2015-10-11 NOTE — Progress Notes (Signed)
Jim passed away with Hospice at [facility] on 12/11.  [place]

## 2015-10-11 NOTE — Progress Notes (Signed)
I received a phone call from Spencer Long stated Spencer Long died February 04, 2023.  I called to check on her but was unable to reach her.

## 2015-10-12 ENCOUNTER — Ambulatory Visit: Payer: Commercial Managed Care - HMO | Admitting: Internal Medicine

## 2015-10-12 ENCOUNTER — Other Ambulatory Visit: Payer: Commercial Managed Care - HMO

## 2015-10-12 ENCOUNTER — Ambulatory Visit: Payer: Commercial Managed Care - HMO

## 2015-10-13 ENCOUNTER — Ambulatory Visit: Payer: Commercial Managed Care - HMO

## 2015-10-14 ENCOUNTER — Ambulatory Visit: Payer: Commercial Managed Care - HMO

## 2015-10-18 ENCOUNTER — Ambulatory Visit: Payer: Commercial Managed Care - HMO | Admitting: Radiation Oncology

## 2015-10-19 ENCOUNTER — Ambulatory Visit: Payer: Commercial Managed Care - HMO

## 2015-10-19 ENCOUNTER — Ambulatory Visit: Payer: Commercial Managed Care - HMO | Admitting: Internal Medicine

## 2015-10-19 ENCOUNTER — Other Ambulatory Visit: Payer: Commercial Managed Care - HMO

## 2015-10-19 ENCOUNTER — Encounter: Payer: Commercial Managed Care - HMO | Admitting: Nutrition

## 2015-10-20 ENCOUNTER — Ambulatory Visit: Payer: Commercial Managed Care - HMO

## 2015-10-21 ENCOUNTER — Ambulatory Visit: Payer: Commercial Managed Care - HMO

## 2015-10-22 ENCOUNTER — Ambulatory Visit: Payer: Commercial Managed Care - HMO

## 2015-10-26 ENCOUNTER — Other Ambulatory Visit: Payer: Commercial Managed Care - HMO

## 2015-10-31 DEATH — deceased

## 2015-11-02 ENCOUNTER — Other Ambulatory Visit: Payer: Commercial Managed Care - HMO

## 2015-11-09 ENCOUNTER — Other Ambulatory Visit: Payer: Commercial Managed Care - HMO

## 2015-11-09 ENCOUNTER — Ambulatory Visit: Payer: Commercial Managed Care - HMO

## 2015-11-09 ENCOUNTER — Ambulatory Visit: Payer: Commercial Managed Care - HMO | Admitting: Internal Medicine

## 2015-11-10 ENCOUNTER — Ambulatory Visit: Payer: Commercial Managed Care - HMO

## 2015-11-11 ENCOUNTER — Ambulatory Visit: Payer: Commercial Managed Care - HMO

## 2015-11-12 ENCOUNTER — Ambulatory Visit: Payer: Commercial Managed Care - HMO

## 2016-04-17 ENCOUNTER — Other Ambulatory Visit: Payer: Self-pay | Admitting: Nurse Practitioner

## 2016-11-13 IMAGING — CT CT ABD-PELV W/ CM
2 of 5 series · 16 of 46 positions shown, 18 images · IV contrast (OMNIPAQUE)
Comparison: CT 10/21/2014, PET-CT 09/11/2014

CLINICAL DATA: Small cell lung cancer diagnosed August 2014 with
only chemotherapy. Subsequent treatment evaluation.

EXAM:
CT CHEST, ABDOMEN, AND PELVIS WITH CONTRAST
TECHNIQUE: Multidetector CT imaging of the chest, abdomen and pelvis was
performed following the standard protocol during bolus
administration of intravenous contrast.
CONTRAST:  100mL OMNIPAQUE IOHEXOL 300 MG/ML  SOLN

[Series 2: cap with st · axial · 0.73mm/px · z∈[+700,+1264]mm · 13 of 129 slices shown, 15 images]
[im 8/129  soft-tissue]
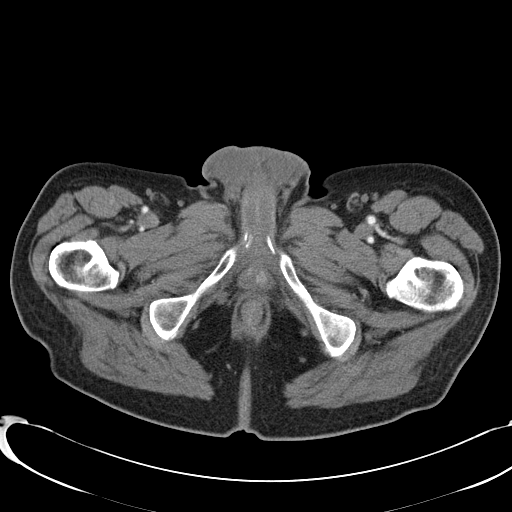
[im 8/129  bone]
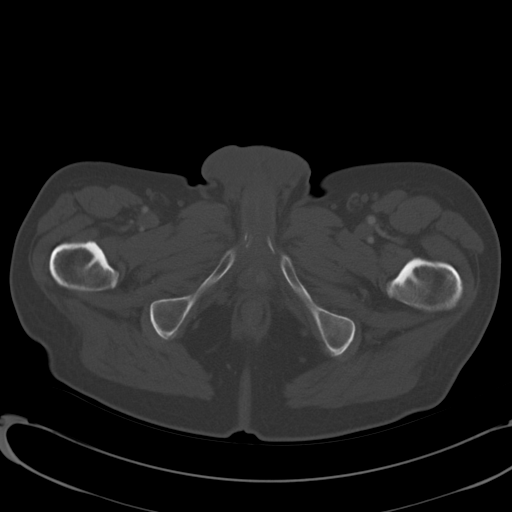
[im 15/129  soft-tissue]
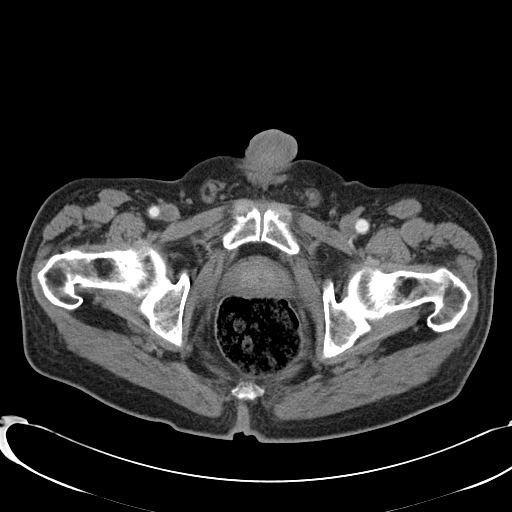
[im 29/129  soft-tissue]
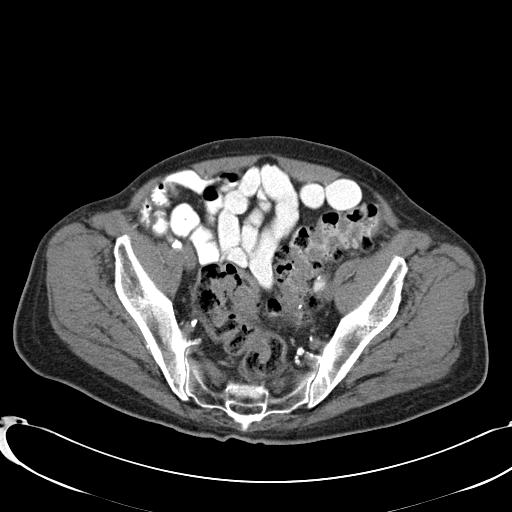
[im 36/129  soft-tissue]
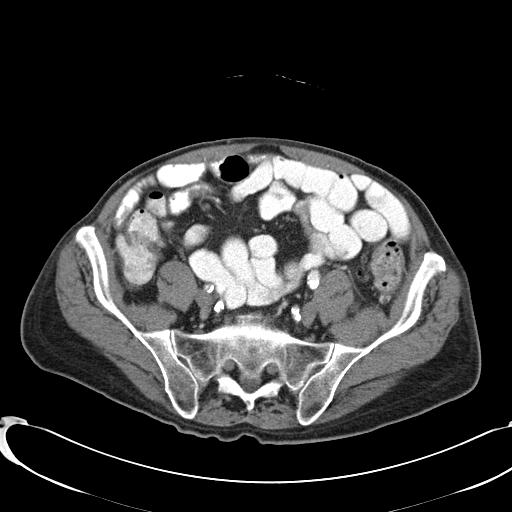
[im 43/129  soft-tissue]
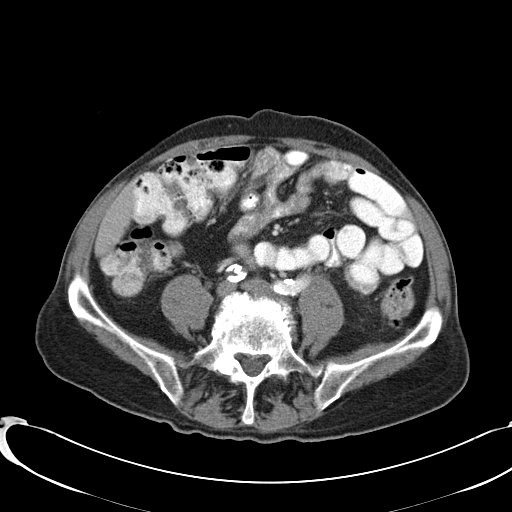
[im 57/129  soft-tissue]
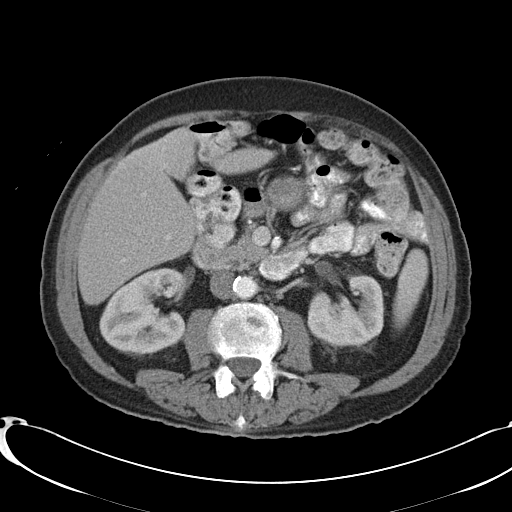
[im 65/129  soft-tissue]
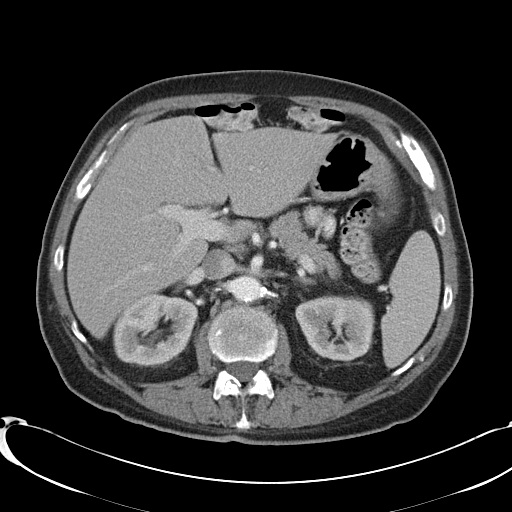
[im 72/129  soft-tissue]
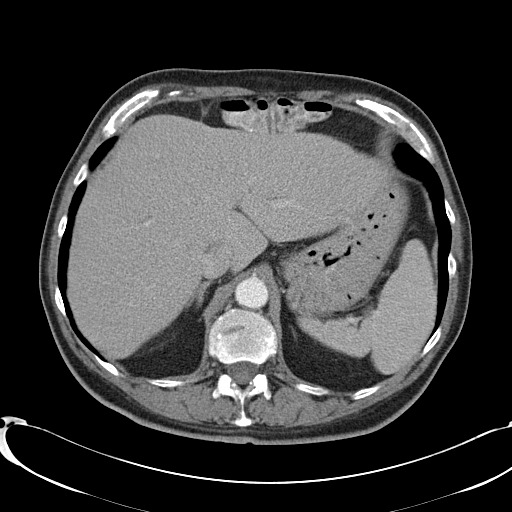
[im 86/129  soft-tissue]
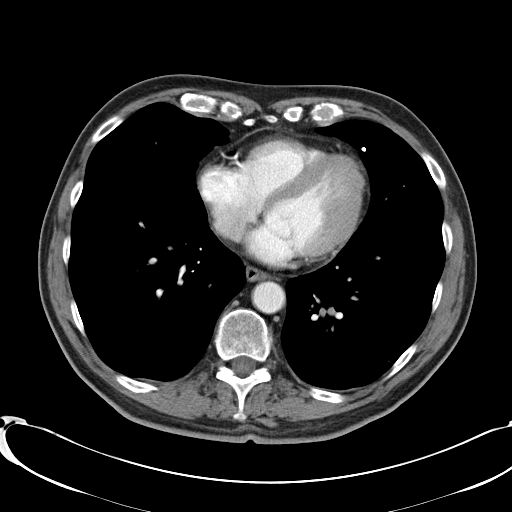
[im 86/129  bone]
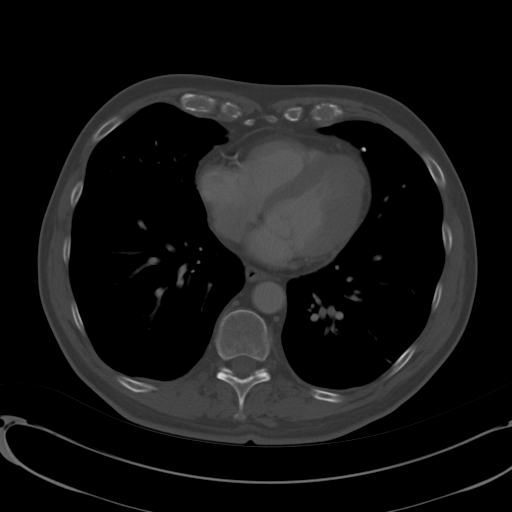
[im 93/129  soft-tissue]
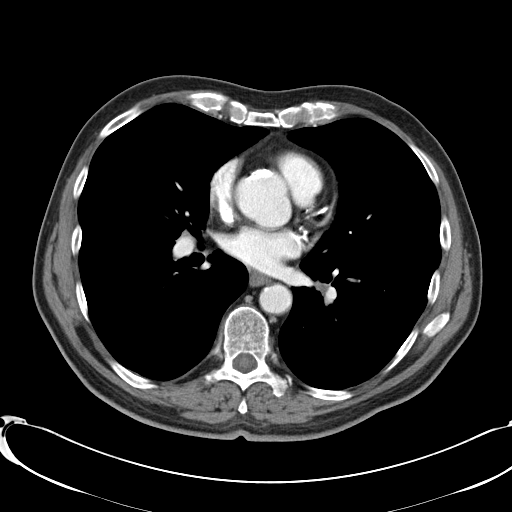
[im 100/129  soft-tissue]
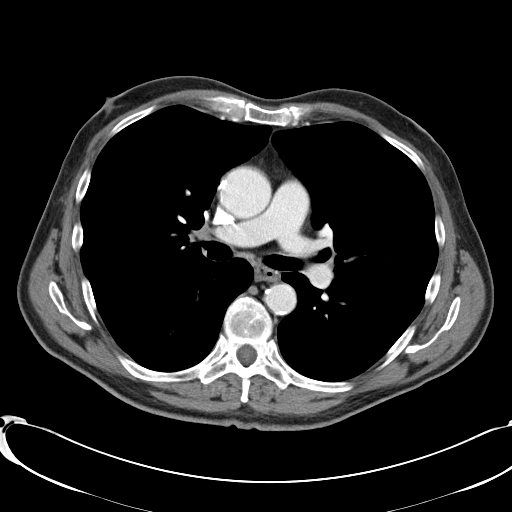
[im 114/129  soft-tissue]
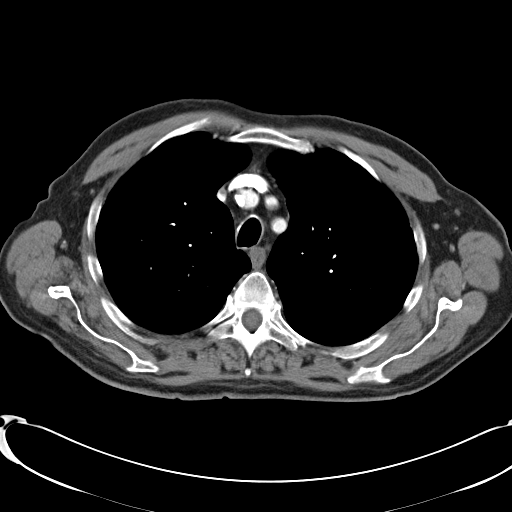
[im 121/129  soft-tissue]
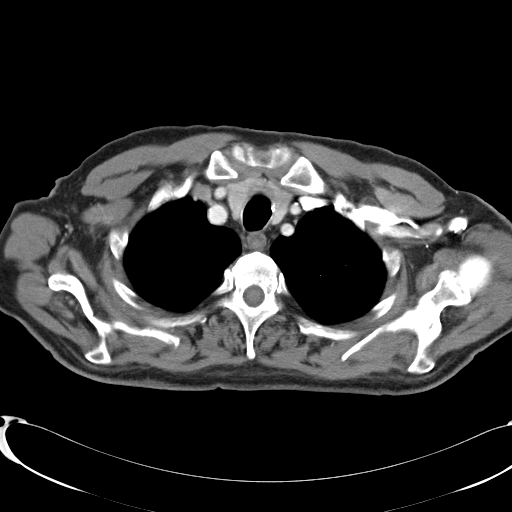

[Series 602: <mpr thick range> · coronal · 1.26mm/px · 3 of 87 slices shown]
[im 29/87  soft-tissue]
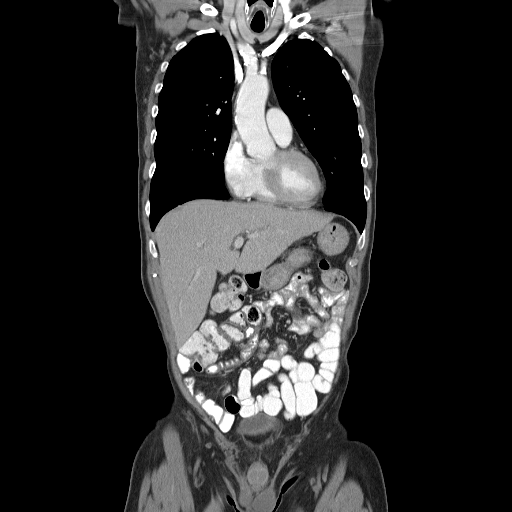
[im 39/87  soft-tissue]
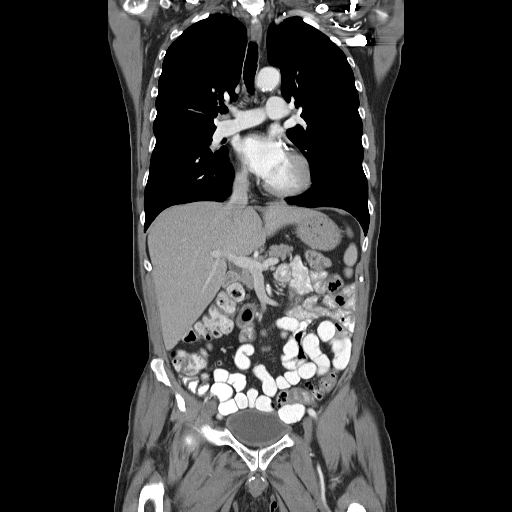
[im 48/87  soft-tissue]
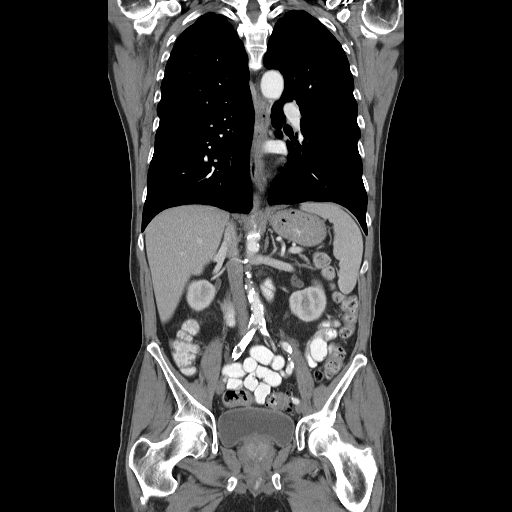

[16 of 46 positions shown; findings below may reference images not displayed]

FINDINGS: CT CHEST FINDINGS

CT CHEST FINDINGS

Mediastinum/Nodes: No axillary supraclavicular lymphadenopathy. No
mediastinal or hilar lymphadenopathy. No pericardial fluid. Central
pulmonary embolism. Esophagus is normal.

Small 10 mm nodule in the left infrahilar location (image 39, series
2) is not changed from 13 mm on prior.

Lungs/Pleura: No suspicious pulmonary nodules. Linear pleural
parenchymal thickening at the left lung base is unchanged. No
pleural nodularity.

Musculoskeletal: No aggressive osseous lesion.

CT ABDOMEN AND PELVIS FINDINGS

Hepatobiliary: No focal hepatic lesion. Post cholecystectomy
anatomy.

Pancreas: Pancreas is normal. No ductal dilatation. No pancreatic
inflammation.

Spleen: Normal spleen.

Adrenals/Urinary Tract: Adrenal glands are normal. Several
calcifications in the right renal hilum appear vascular. No
enhancing renal lesion. Ureters and bladder normal. There is small
diverticulum along the posterior left aspect bladder.

Stomach/Bowel: Stomach, small bowel, appendix, cecum normal. There
are diverticula of the sigmoid colon without acute inflammation.
Stool in the rectum.

Vascular/Lymphatic: Abdominal aorta is normal caliber with
atherosclerotic calcification. There is no retroperitoneal or
periportal lymphadenopathy. No pelvic lymphadenopathy.

Reproductive: Prostate gland is normal.  No pelvic lymphadenopathy.

Other:  No peritoneal nodularity

Musculoskeletal: No aggressive osseous lesion.
IMPRESSION: 1. No evidence of small cell lung cancer recurrence in the chest,
abdomen, or pelvis.
2. Stable small left infrahilar nodule at site of prior
hypermetabolic mass .
3. Sigmoid diverticulosis without acute diverticulitis.
4. Atherosclerotic calcifications of aorta and its branches.

## 2017-08-15 IMAGING — US IR FLUORO GUIDE CV LINE*R*
1 series · 1 of 1 positions shown · non-contrast
Comparison: none

ADDENDUM:
Correction to

MEDICATIONS AND MEDICAL HISTORY:
4 mg Versed, 50 mcg fentanyl
CLINICAL DATA: SMALL CELL LUNG CARCINOMA, ACCESS FOR CHEMOTHERAPY

[Series 1: ir fluoro/shunt/fist · 1 of 1 slices shown]
[im 1/1]
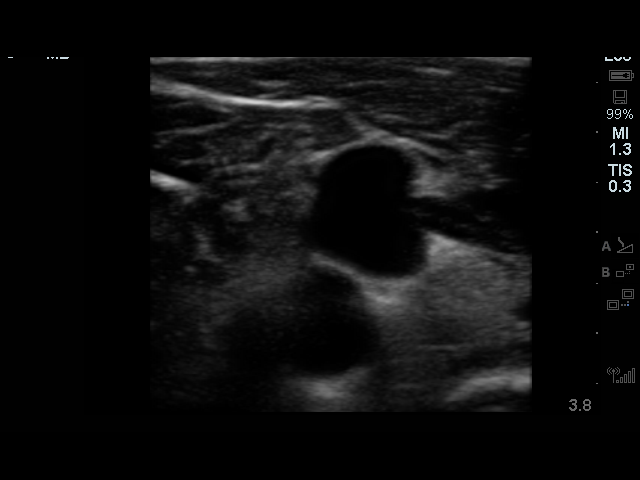

[1 of 1 positions shown; findings below may reference images not displayed]

EXAM:
RIGHT INTERNAL JUGULAR SINGLE LUMEN POWER PORT CATHETER INSERTION

Radiologist:  Hezekiah, Psalmuel

Guidance:  Ultrasound and fluoroscopic

FLUOROSCOPY TIME:  30 seconds

MEDICATIONS AND MEDICAL HISTORY:
2 g Ancefadministered within 1 hour of the procedure.2 mg Versed,
100 mcg fentanyl

ANESTHESIA/SEDATION:
30 minutes

CONTRAST:  None.

COMPLICATIONS:
None immediate

PROCEDURE:
Informed consent was obtained from the patient following explanation
of the procedure, risks, benefits and alternatives. The patient
understands, agrees and consents for the procedure. All questions
were addressed. A time out was performed.

Maximal barrier sterile technique utilized including caps, mask,
sterile gowns, sterile gloves, large sterile drape, hand hygiene,
and 2% chlorhexidine scrub.

Under sterile conditions and local anesthesia, right internal
jugular micropuncture venous access was performed. Access was
performed with ultrasound. Images were obtained for documentation. A
guide wire was inserted followed by a transitional dilator. This
allowed insertion of a guide wire and catheter into the IVC.
Measurements were obtained from the SVC / RA junction back to the
right IJ venotomy site. In the right infraclavicular chest, a
subcutaneous pocket was created over the second anterior rib. This
was done under sterile conditions and local anesthesia. 1% lidocaine
with epinephrine was utilized for this. A 2.5 cm incision was made
in the skin. Blunt dissection was performed to create a subcutaneous
pocket over the right pectoralis major muscle. The pocket was
flushed with saline vigorously. There was adequate hemostasis. The
port catheter was assembled and checked for leakage. The port
catheter was secured in the pocket with two retention sutures. The
tubing was tunneled subcutaneously to the right venotomy site and
inserted into the SVC/RA junction through a valved peel-away sheath.
Position was confirmed with fluoroscopy. Images were obtained for
documentation. The patient tolerated the procedure well. No
immediate complications. Incisions were closed in a two layer
fashion with 4 - 0 Vicryl suture. Dermabond was applied to the skin.
The port catheter was accessed, blood was aspirated followed by
saline and heparin flushes. Needle was removed. A dry sterile
dressing was applied.
IMPRESSION: Ultrasound and fluoroscopically guided right internal jugular single
lumen power port catheter insertion. Tip in the SVC/RA junction.
Catheter ready for use.
